# Patient Record
Sex: Male | Born: 1945 | Race: White | Hispanic: No | Marital: Married | Smoking: Former smoker
Health system: Southern US, Community
[De-identification: ages and names within clinical notes are randomized; demographics above are authoritative.]

## PROBLEM LIST (undated history)

## (undated) DIAGNOSIS — J439 Emphysema, unspecified: Secondary | ICD-10-CM

## (undated) DIAGNOSIS — R0600 Dyspnea, unspecified: Secondary | ICD-10-CM

## (undated) DIAGNOSIS — J302 Other seasonal allergic rhinitis: Secondary | ICD-10-CM

## (undated) DIAGNOSIS — C3492 Malignant neoplasm of unspecified part of left bronchus or lung: Secondary | ICD-10-CM

## (undated) DIAGNOSIS — Z9981 Dependence on supplemental oxygen: Secondary | ICD-10-CM

## (undated) DIAGNOSIS — I272 Pulmonary hypertension, unspecified: Secondary | ICD-10-CM

## (undated) DIAGNOSIS — F419 Anxiety disorder, unspecified: Secondary | ICD-10-CM

## (undated) DIAGNOSIS — F32A Depression, unspecified: Secondary | ICD-10-CM

## (undated) DIAGNOSIS — I509 Heart failure, unspecified: Secondary | ICD-10-CM

## (undated) DIAGNOSIS — M5430 Sciatica, unspecified side: Secondary | ICD-10-CM

## (undated) DIAGNOSIS — I10 Essential (primary) hypertension: Secondary | ICD-10-CM

## (undated) DIAGNOSIS — F329 Major depressive disorder, single episode, unspecified: Secondary | ICD-10-CM

## (undated) DIAGNOSIS — J90 Pleural effusion, not elsewhere classified: Secondary | ICD-10-CM

## (undated) HISTORY — DX: Other seasonal allergic rhinitis: J30.2

## (undated) HISTORY — PX: ANGIOPLASTY: SHX39

## (undated) HISTORY — DX: Essential (primary) hypertension: I10

## (undated) HISTORY — DX: Pleural effusion, not elsewhere classified: J90

## (undated) HISTORY — DX: Heart failure, unspecified: I50.9

## (undated) HISTORY — DX: Emphysema, unspecified: J43.9

## (undated) HISTORY — DX: Pulmonary hypertension, unspecified: I27.20

## (undated) HISTORY — DX: Malignant neoplasm of unspecified part of left bronchus or lung: C34.92

## (undated) HISTORY — PX: TONSILLECTOMY: SUR1361

## (undated) HISTORY — PX: CARDIAC CATHETERIZATION: SHX172

## (undated) HISTORY — PX: MASTOIDECTOMY: SHX711

---

## 2014-05-17 DIAGNOSIS — I10 Essential (primary) hypertension: Secondary | ICD-10-CM | POA: Insufficient documentation

## 2014-05-17 DIAGNOSIS — I5021 Acute systolic (congestive) heart failure: Secondary | ICD-10-CM | POA: Insufficient documentation

## 2014-06-10 DIAGNOSIS — J90 Pleural effusion, not elsewhere classified: Secondary | ICD-10-CM | POA: Insufficient documentation

## 2014-11-09 DIAGNOSIS — F17201 Nicotine dependence, unspecified, in remission: Secondary | ICD-10-CM | POA: Insufficient documentation

## 2014-11-09 DIAGNOSIS — J449 Chronic obstructive pulmonary disease, unspecified: Secondary | ICD-10-CM | POA: Insufficient documentation

## 2014-11-09 DIAGNOSIS — I50812 Chronic right heart failure: Secondary | ICD-10-CM | POA: Insufficient documentation

## 2015-02-17 DIAGNOSIS — M545 Low back pain, unspecified: Secondary | ICD-10-CM | POA: Insufficient documentation

## 2015-02-17 DIAGNOSIS — I272 Pulmonary hypertension, unspecified: Secondary | ICD-10-CM | POA: Insufficient documentation

## 2016-01-08 ENCOUNTER — Encounter: Payer: Self-pay | Admitting: Pulmonary Disease

## 2016-01-08 ENCOUNTER — Ambulatory Visit (INDEPENDENT_AMBULATORY_CARE_PROVIDER_SITE_OTHER): Payer: Medicare Other | Admitting: Pulmonary Disease

## 2016-01-08 VITALS — BP 100/62 | HR 109 | Ht 67.75 in | Wt 125.6 lb

## 2016-01-08 DIAGNOSIS — J438 Other emphysema: Secondary | ICD-10-CM

## 2016-01-08 DIAGNOSIS — I272 Other secondary pulmonary hypertension: Secondary | ICD-10-CM

## 2016-01-08 DIAGNOSIS — J432 Centrilobular emphysema: Secondary | ICD-10-CM

## 2016-01-08 DIAGNOSIS — J90 Pleural effusion, not elsewhere classified: Secondary | ICD-10-CM

## 2016-01-08 DIAGNOSIS — J9611 Chronic respiratory failure with hypoxia: Secondary | ICD-10-CM

## 2016-01-08 DIAGNOSIS — R64 Cachexia: Secondary | ICD-10-CM

## 2016-01-08 MED ORDER — ALBUTEROL SULFATE (2.5 MG/3ML) 0.083% IN NEBU: 2.5000 mg | INHALATION_SOLUTION | Freq: Four times a day (QID) | RESPIRATORY_TRACT | Status: AC | PRN

## 2016-01-08 MED ORDER — IPRATROPIUM BROMIDE 0.02 % IN SOLN: 0.5000 mg | Freq: Four times a day (QID) | RESPIRATORY_TRACT | Status: DC

## 2016-01-08 NOTE — Progress Notes (Deleted)
   Subjective:    Patient ID: Jose Mckinney, male    DOB: [birth year], 70 y.o.   MRN: [id number]  HPI    Review of Systems  Constitutional: Positive for appetite change and unexpected weight change. Negative for fever.       Since 2013/2014  HENT: Positive for congestion and sneezing. Negative for dental problem, ear pain, nosebleeds, postnasal drip, rhinorrhea, sinus pressure, sore throat and trouble swallowing.   Eyes: Negative for redness and itching.  Respiratory: Positive for shortness of breath. Negative for cough, chest tightness and wheezing.   Cardiovascular: Negative for palpitations and leg swelling.  Gastrointestinal: Positive for abdominal pain. Negative for nausea and vomiting.  Genitourinary: Negative for dysuria.  Musculoskeletal: Negative for joint swelling.  Skin: Positive for rash ( ithcing).  Neurological: Negative for headaches.  Hematological: Does not bruise/bleed easily.  Psychiatric/Behavioral: Negative for dysphoric mood. The patient is not nervous/anxious.        Objective:   Physical Exam        Assessment & Plan:

## 2016-01-08 NOTE — Patient Instructions (Signed)
Albuterol and Ipratropium nebulized every 4 to 6 hours as needed for cough, wheeze, or chest congestion  Will arrange for referral to pulmonary rehab  Follow up in 3 months

## 2016-01-08 NOTE — Progress Notes (Signed)
Past medical history He  has a past medical history of HTN (hypertension); COPD (chronic obstructive pulmonary disease) ([place]); CHF (congestive heart failure) ([place]); Pulmonary HTN ([place]); and Seasonal allergies.  Past surgical history He  has past surgical history that includes Mastoidectomy and Angioplasty.  Family history His family history is not on file. He was adopted.  Social history He  reports that he quit smoking about 17 months ago. His smoking use included Cigarettes. He has a 125 pack-year smoking history. He does not have any smokeless tobacco history on file. He reports that he drinks alcohol. He reports that he does not use illicit drugs.  No Known Allergies  No current outpatient prescriptions on file prior to visit.   No current facility-administered medications on file prior to visit.   Review of Systems  Constitutional: Positive for appetite change and unexpected weight change. Negative for fever.       Since 2013/2014  HENT: Positive for congestion and sneezing. Negative for dental problem, ear pain, nosebleeds, postnasal drip, rhinorrhea, sinus pressure, sore throat and trouble swallowing.   Eyes: Negative for redness and itching.  Respiratory: Positive for shortness of breath. Negative for cough, chest tightness and wheezing.   Cardiovascular: Negative for palpitations and leg swelling.  Gastrointestinal: Positive for abdominal pain. Negative for nausea and vomiting.  Genitourinary: Negative for dysuria.  Musculoskeletal: Negative for joint swelling.  Skin: Positive for rash ( ithcing).  Neurological: Negative for headaches.  Hematological: Does not bruise/bleed easily.  Psychiatric/Behavioral: Negative for dysphoric mood. The patient is not nervous/anxious.    Chief Complaint  Patient presents with  . Pulmonary Consult    Referred by Dr Tollie Pizza for COPD.     Tests CT chest 05/30/14 >> loculated Lt effusion, free flowing Rt effusion Echo 06/09/14 >> EF 60 to  47%, grade 1 diastolic dysfx, mild MR  Vital signs BP 100/62 mmHg  Pulse 109  Ht 5' 7.75" (1.721 m)  Wt 125 lb 9.6 oz (56.972 kg)  BMI 19.24 kg/m2  SpO2 95%  History of present illness Jose Mckinney is a 70 y.o. 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  He recently moved from [place], [place] to [place].  He needs to establish with pulmonary medicine here.  He has long history of smoking, but has quit.  He is no longer working.  He denies occupational exposures.  He has hx of loculated Lt pleural effusion.  No diagnosis was established for this.  He gets winded with activity, but tries to stay active.  He is interest in enrolling in pulmonary rehab.  He has occasional cough and wheeze.  His inhalers are much more expensive in [place] compared to [place].  As a result he has been out of spiriva and symbicort.  He has noticed the difference w/o these inhalers.  He has been on revatio for pulmonary hypertension.  He denies chest pain, fever, leg swelling, or hemoptysis.  He has lost about 40 lbs over the past 2 to 3 yrs, but his weight has been steady recently.  He denies hx of pneumonia or exposure to tuberculosis.   Physical exam  General - No distress, thin ENT - No sinus tenderness, no oral exudate, no LAN, no thyromegaly, TM clear, pupils equal/reactive, wears dentures Cardiac - s1s2 regular, no murmur, pulses symmetric Chest - decreased BS at bases, poor air movement, no wheezing Back - No focal tenderness Abd - Soft, non-tender, no organomegaly, + bowel sounds Ext - No edema, decreased muscle  mass Neuro - Normal strength, cranial nerves intact Skin - No rashes Psych - Normal mood, and behavior   Assessment/plan  COPD/emphysema. Plan: - will have him try prn albuterol/ipratopium via nebulizer >> he is not able to afford other inhalers - will arrange for pulmonary rehab referral  Chronic loculated left pleural effusion. Plan: - monitor clinically and f/u  CXR if symptoms progress  Pulmonary hypertension. Plan: - will continue revatio since he has been on this for a while, and seems to tolerate therapy with some improvement  Protein calorie malnutrition. Plan: - f/u with PCP    Patient Instructions  Albuterol and Ipratropium nebulized every 4 to 6 hours as needed for cough, wheeze, or chest congestion  Will arrange for referral to pulmonary rehab  Follow up in 3 months     Chesley Mires, MD [facility] Pager:  [phone]

## 2016-01-09 ENCOUNTER — Encounter: Payer: Self-pay | Admitting: Pulmonary Disease

## 2016-01-22 ENCOUNTER — Other Ambulatory Visit: Payer: Self-pay | Admitting: Gastroenterology

## 2016-02-05 ENCOUNTER — Encounter (HOSPITAL_COMMUNITY)
Admission: RE | Admit: 2016-02-05 | Discharge: 2016-02-05 | Disposition: A | Discharge status: Home / Self Care | Payer: Medicare Other | Source: Ambulatory Visit | Attending: Pulmonary Disease | Admitting: Pulmonary Disease

## 2016-02-05 ENCOUNTER — Encounter (HOSPITAL_COMMUNITY): Payer: Self-pay

## 2016-02-05 VITALS — BP 125/97 | HR 85 | Resp 18 | Ht 67.25 in | Wt 127.2 lb

## 2016-02-05 DIAGNOSIS — J439 Emphysema, unspecified: Secondary | ICD-10-CM | POA: Diagnosis present

## 2016-02-05 DIAGNOSIS — J438 Other emphysema: Secondary | ICD-10-CM

## 2016-02-05 NOTE — Progress Notes (Signed)
Jose Mckinney 70 y.o. male Pulmonary Rehab Orientation Note Patient arrived today in Cardiac and Pulmonary Rehab for orientation to Pulmonary Rehab. He ambulated from [place] parking, accompanied by his wife. He does carry portable oxygen, but uses it randomly, when he feels short of breath. He also has a home concentrator, but again uses it randomly and not consistently at night. Color good, skin warm and dry. Patient is oriented to time and place. Patient's medical history, psychosocial health, and medications reviewed. Psychosocial assessment reveals pt lives with their spouse. They moved here from [place] the end of 2016 to be closer to their son and grandchildren. They are now living in [place], [place]. Pt is currently retired. He was in the grocery business prior to moving. Pt hobbies include reading, racing 6 wheeled ATVs, and working on them. He no longer races them, although he would love to start again. He continues to work on them, but has trouble with shortness of breath when he bends over. He also loves spending time with his grandchildren. Pt reports his stress level is moderate. Areas of stress/anxiety include Health.  Pt exhibits signs of depression. Signs of depression include anxiety and difficulty falling asleep. PHQ2/9 score 1/NA. Pt shows good  coping skills with a questionable outlook on live. He is extremely excited to begin pulmonary rehab and feels this is a new beginning to managing his disease. He and his wife are also looking forward to the education portion of the program as the do not fully understand emphysema and pulmonary hypertension. He was offered emotional support and reassurance. Will continue to monitor and evaluate progress toward psychosocial goal(s) of becoming more positive about his future and his ability to maintain/improve his health. Physical assessment reveals heart rate is normal, breath sounds are diminished throughout. Grip strength equal, strong. Distal pulses  palpable. No edema noted. Patient reports he does take medications as prescribed. Patient states he follows a Regular diet, but does attempt to watch his salt. He has lost a significant amount of weight over the last year. He complains of almost complete loss of taste. The patient reports no specific efforts to gain or lose weight. He does weight himself daily and monitors his weight loss. Patient's weight will be monitored closely. Demonstration and practice of PLB using pulse oximeter. Patient able to return demonstration satisfactorily. Safety and hand hygiene in the exercise area reviewed with patient. Patient voices understanding of the information reviewed. Department expectations discussed with patient and achievable goals were set. The patient shows enthusiasm about attending the program and we look forward to working with this nice gentleman. The patient is scheduled for a 6 min walk test on Tuesday 2/14 at 3:30 and to begin exercise on Tuesday 2/21 in the 10:30 class.   45 minutes was spent on a variety of activities such as assessment of the patient, obtaining baseline data including height, weight, BMI, and grip strength, verifying medical history, allergies, and current medications, and teaching patient strategies for performing tasks with less respiratory effort with emphasis on pursed lip breathing.

## 2016-02-06 ENCOUNTER — Encounter (HOSPITAL_COMMUNITY): Payer: Self-pay | Admitting: *Deleted

## 2016-02-06 ENCOUNTER — Encounter (HOSPITAL_COMMUNITY)
Admission: RE | Admit: 2016-02-06 | Discharge: 2016-02-06 | Disposition: A | Discharge status: Home / Self Care | Payer: Medicare Other | Source: Ambulatory Visit | Attending: Pulmonary Disease | Admitting: Pulmonary Disease

## 2016-02-06 DIAGNOSIS — J439 Emphysema, unspecified: Secondary | ICD-10-CM | POA: Diagnosis not present

## 2016-02-06 NOTE — Progress Notes (Signed)
Alarik completed a Six-Minute Walk Test on 02/06/16 . Ananias walked 670 feet with 1 break of 1 minute 8 seconds.  The patient's lowest oxygen saturation was 82 %, highest heart rate was 112 bpm , and highest blood pressure was 108/70. The patient was on 2 liters of oxygen with a nasal cannula. Patient stated that nothing hindered their walk test.   Sol Passer, MS, ACSM CCEP

## 2016-02-09 ENCOUNTER — Encounter (HOSPITAL_COMMUNITY)
Admission: RE | Disposition: A | Discharge status: Home / Self Care | Payer: Self-pay | Source: Ambulatory Visit | Attending: Gastroenterology

## 2016-02-09 ENCOUNTER — Ambulatory Visit (HOSPITAL_COMMUNITY): Payer: Medicare Other | Admitting: Anesthesiology

## 2016-02-09 ENCOUNTER — Encounter (HOSPITAL_COMMUNITY): Payer: Self-pay | Admitting: *Deleted

## 2016-02-09 ENCOUNTER — Ambulatory Visit (HOSPITAL_COMMUNITY)
Admission: RE | Admit: 2016-02-09 | Discharge: 2016-02-09 | Disposition: A | Discharge status: Home / Self Care | Payer: Medicare Other | Source: Ambulatory Visit | Attending: Gastroenterology | Admitting: Gastroenterology

## 2016-02-09 DIAGNOSIS — I272 Other secondary pulmonary hypertension: Secondary | ICD-10-CM | POA: Diagnosis not present

## 2016-02-09 DIAGNOSIS — Z9981 Dependence on supplemental oxygen: Secondary | ICD-10-CM | POA: Diagnosis not present

## 2016-02-09 DIAGNOSIS — Z79899 Other long term (current) drug therapy: Secondary | ICD-10-CM | POA: Diagnosis not present

## 2016-02-09 DIAGNOSIS — K649 Unspecified hemorrhoids: Secondary | ICD-10-CM | POA: Diagnosis not present

## 2016-02-09 DIAGNOSIS — I509 Heart failure, unspecified: Secondary | ICD-10-CM | POA: Insufficient documentation

## 2016-02-09 DIAGNOSIS — K5730 Diverticulosis of large intestine without perforation or abscess without bleeding: Secondary | ICD-10-CM | POA: Diagnosis not present

## 2016-02-09 DIAGNOSIS — I110 Hypertensive heart disease with heart failure: Secondary | ICD-10-CM | POA: Insufficient documentation

## 2016-02-09 DIAGNOSIS — I10 Essential (primary) hypertension: Secondary | ICD-10-CM | POA: Insufficient documentation

## 2016-02-09 DIAGNOSIS — R001 Bradycardia, unspecified: Secondary | ICD-10-CM | POA: Diagnosis not present

## 2016-02-09 DIAGNOSIS — Z1211 Encounter for screening for malignant neoplasm of colon: Secondary | ICD-10-CM | POA: Diagnosis present

## 2016-02-09 DIAGNOSIS — Z87891 Personal history of nicotine dependence: Secondary | ICD-10-CM | POA: Diagnosis not present

## 2016-02-09 DIAGNOSIS — J439 Emphysema, unspecified: Secondary | ICD-10-CM | POA: Diagnosis not present

## 2016-02-09 DIAGNOSIS — D128 Benign neoplasm of rectum: Secondary | ICD-10-CM | POA: Diagnosis not present

## 2016-02-09 DIAGNOSIS — D122 Benign neoplasm of ascending colon: Secondary | ICD-10-CM | POA: Diagnosis not present

## 2016-02-09 HISTORY — DX: Depression, unspecified: F32.A

## 2016-02-09 HISTORY — PX: COLONOSCOPY WITH PROPOFOL: SHX5780

## 2016-02-09 HISTORY — DX: Major depressive disorder, single episode, unspecified: F32.9

## 2016-02-09 HISTORY — DX: Anxiety disorder, unspecified: F41.9

## 2016-02-09 HISTORY — DX: Sciatica, unspecified side: M54.30

## 2016-02-09 LAB — BASIC METABOLIC PANEL
ANION GAP: 13 (ref 5–15)
BUN: 28 mg/dL — ABNORMAL HIGH (ref 6–20)
CO2: 28 mmol/L (ref 22–32)
Calcium: 10.2 mg/dL (ref 8.9–10.3)
Chloride: 97 mmol/L — ABNORMAL LOW (ref 101–111)
Creatinine, Ser: 1.13 mg/dL (ref 0.61–1.24)
Glucose, Bld: 80 mg/dL (ref 65–99)
POTASSIUM: 4.3 mmol/L (ref 3.5–5.1)
SODIUM: 138 mmol/L (ref 135–145)

## 2016-02-09 LAB — CBC
HCT: 35.7 % — ABNORMAL LOW (ref 39.0–52.0)
Hemoglobin: 11.4 g/dL — ABNORMAL LOW (ref 13.0–17.0)
MCH: 29.9 pg (ref 26.0–34.0)
MCHC: 31.9 g/dL (ref 30.0–36.0)
MCV: 93.7 fL (ref 78.0–100.0)
PLATELETS: 277 10*3/uL (ref 150–400)
RBC: 3.81 MIL/uL — AB (ref 4.22–5.81)
RDW: 13.8 % (ref 11.5–15.5)
WBC: 9 10*3/uL (ref 4.0–10.5)

## 2016-02-09 LAB — POCT I-STAT 4, (NA,K, GLUC, HGB,HCT)
GLUCOSE: 78 mg/dL (ref 65–99)
HEMATOCRIT: 37 % — AB (ref 39.0–52.0)
Hemoglobin: 12.6 g/dL — ABNORMAL LOW (ref 13.0–17.0)
POTASSIUM: 4.1 mmol/L (ref 3.5–5.1)
Sodium: 135 mmol/L (ref 135–145)

## 2016-02-09 SURGERY — COLONOSCOPY WITH PROPOFOL
Anesthesia: Monitor Anesthesia Care

## 2016-02-09 MED ORDER — PROPOFOL 500 MG/50ML IV EMUL
INTRAVENOUS | Status: DC | PRN
  Administered 2016-02-09 (×2): 30 mg via INTRAVENOUS
  Administered 2016-02-09 (×2): 20 mg via INTRAVENOUS

## 2016-02-09 MED ORDER — PROPOFOL 10 MG/ML IV BOLUS
INTRAVENOUS | Status: AC
  Filled 2016-02-09: qty 40

## 2016-02-09 MED ORDER — GLYCOPYRROLATE 0.2 MG/ML IJ SOLN
INTRAMUSCULAR | Status: DC | PRN
  Administered 2016-02-09: 0.2 mg via INTRAVENOUS

## 2016-02-09 MED ORDER — SODIUM CHLORIDE 0.9 % IV SOLN: INTRAVENOUS | Status: DC

## 2016-02-09 MED ORDER — LACTATED RINGERS IV SOLN
INTRAVENOUS | Status: DC
  Administered 2016-02-09: 10:00:00 via INTRAVENOUS

## 2016-02-09 MED ORDER — GLYCOPYRROLATE 0.2 MG/ML IJ SOLN
INTRAMUSCULAR | Status: AC
  Filled 2016-02-09: qty 1

## 2016-02-09 MED ORDER — PROPOFOL 500 MG/50ML IV EMUL
INTRAVENOUS | Status: DC | PRN
  Administered 2016-02-09: 75 ug/kg/min via INTRAVENOUS

## 2016-02-09 SURGICAL SUPPLY — 21 items

## 2016-02-09 NOTE — Anesthesia Preprocedure Evaluation (Addendum)
Anesthesia Evaluation  Patient identified by MRN, date of birth, ID band Patient awake    Reviewed: Allergy & Precautions, NPO status , Patient's Chart, lab work & pertinent test results, reviewed documented beta blocker date and time   Airway Mallampati: I  TM Distance: >3 FB Neck ROM: Full    Dental  (+) Edentulous Upper, Edentulous Lower   Pulmonary COPD,  oxygen dependent, former smoker,     + decreased breath sounds      Cardiovascular hypertension, Pt. on medications and Pt. on home beta blockers +CHF   Rhythm:Regular Rate:Normal     Neuro/Psych PSYCHIATRIC DISORDERS Anxiety Depression  Neuromuscular disease    GI/Hepatic negative GI ROS, Neg liver ROS,   Endo/Other  negative endocrine ROS  Renal/GU negative Renal ROS  negative genitourinary   Musculoskeletal negative musculoskeletal ROS (+)   Abdominal   Peds negative pediatric ROS (+)  Hematology negative hematology ROS (+)   Anesthesia Other Findings - Pulm HTN   Reproductive/Obstetrics negative OB ROS                           Anesthesia Physical Anesthesia Plan  ASA: III  Anesthesia Plan: MAC   Post-op Pain Management:    Induction: Intravenous  Airway Management Planned: Natural Airway  Additional Equipment:   Intra-op Plan:   Post-operative Plan:   Informed Consent: I have reviewed the patients History and Physical, chart, labs and discussed the procedure including the risks, benefits and alternatives for the proposed anesthesia with the patient or authorized representative who has indicated his/her understanding and acceptance.     Plan Discussed with: CRNA  Anesthesia Plan Comments:         Anesthesia Quick Evaluation

## 2016-02-09 NOTE — H&P (Signed)
  Jose Mckinney HPI: At this time the patient denies any problems with nausea, vomiting, fevers, chills, abdominal pain, diarrhea, constipation, hematochezia, melena, GERD, or dysphagia. The patient was adopted. He does not know his family history. No complaints of chest pain, MI, or sleep apnea. The patient quit smoking last Summer and he has lost 40 lbs. The patient has severe emphysema and he is on home oxygen.  Past Medical History  Diagnosis Date  . HTN (hypertension)   . COPD with emphysema ([place] [facility])   . Pulmonary HTN ([facility])     Dr. Marlyn Corporal is following.  . Seasonal allergies   . Recurrent left pleural effusion   . Depression   . Anxiety   . Sciatic leg pain     INTERMITTENT- occ. uses Hydrocodone as needed  . CHF (congestive heart failure) ([facility])     x 3 episodes- [place]. pt. living here since 11-'1-16    Past Surgical History  Procedure Laterality Date  . Mastoidectomy Left   . Angioplasty    . Cardiac catheterization      '15- [place]- no issues now.  . Tonsillectomy      Family History  Problem Relation Age of Onset  . Adopted: Yes    Social History:  reports that he quit smoking about 19 months ago. His smoking use included Cigarettes. He has a 125 pack-year smoking history. He does not have any smokeless tobacco history on file. He reports that he drinks alcohol. He reports that he does not use illicit drugs.  Allergies: No Known Allergies  Medications: Scheduled: Continuous:  No results found for this or any previous visit (from the past 24 hour(s)).   No results found.  ROS:  As stated above in the HPI otherwise negative.  There were no vitals taken for this visit.    PE: Gen: NAD, Alert and Oriented HEENT:  [place]/AT, EOMI Neck: Supple, no LAD Lungs: CTA Bilaterally CV: RRR without M/G/R ABM: Soft, NTND, +BS Ext: No C/C/E  Assessment/Plan: 1) Screening colonoscopy. 2) Emphysema - severe.  Regla Fitzgibbon D 02/09/2016, 7:08 AM

## 2016-02-09 NOTE — Op Note (Signed)
[facility] [place], [address]   COLONOSCOPY PROCEDURE REPORT  PATIENT: Jose, Mckinney  MR#: [id number] BIRTHDATE: November 24, 1946 , 28  yrs. old GENDER: male ENDOSCOPIST: Carol Ada, MD REFERRED BY: PROCEDURE DATE:  Mar 01, 2016 PROCEDURE:   Colonoscopy with snare polypectomy ASA CLASS:   Class III INDICATIONS: Screening MEDICATIONS: Monitored anesthesia care  DESCRIPTION OF PROCEDURE:   After the risks and benefits and of the procedure were explained, informed consent was obtained.  revealed no abnormalities of the rectum.    The Pentax Ped Colon [id number] endoscope was introduced through the anus and advanced to the cecum, which was identified by both the appendix and ileocecal valve .  The quality of the prep was good. .  The instrument was then slowly withdrawn as the colon was fully examined. Estimated blood loss is zero unless otherwise noted in this procedure report.   FINDINGS:  The colonoscopy was extremely difficult to perform as the sigmoid colon was extremely torturous.  Intubation time was approximately 30 minutes.  Even with minimal insufflation and the use of water infusion, it was still very difficult to traverse. Abdominal pressure was not helpful.  With slow maneuvering and angulation the colonoscope was able to pass through the ares.  A 4 mm sessile ascending colon polyp was removed with a cold snare.  A 4 mm sessile rectal polyp was removed with a cold snare, but there was residual tissue that was not amenable to further cold resection.  The area was abalted with the tip of the snare with cautery.  Sigmoid diverticula were found.  Some hematochezia was found during the procedure and this was secondary to his hemorrhoids.     Retroflexed views revealed no abnormalities. The scope was then withdrawn from the patient and the procedure completed.  WITHDRAWAL TIME: 11 minutes.  COMPLICATIONS: There were no immediate  complications. ENDOSCOPIC IMPRESSION: 1) Polyps. 2) Diverticula.  RECOMMENDATIONS: 1) Follow up biopsies. 2) Repeat the colonoscopy in 5 years.  REPEAT EXAM:  cc:  _______________________________ eSignedCarol Ada, MD 03/01/16 11:23 AM   CPT CODES: ICD CODES:  The ICD and CPT codes recommended by this software are interpretations from the data that the clinical staff has captured with the software.  The verification of the translation of this report to the ICD and CPT codes and modifiers is the sole responsibility of the health care institution and practicing physician where this report was generated.  [facility]. will not be held responsible for the validity of the ICD and CPT codes included on this report.  AMA assumes no liability for data contained or not contained herein. CPT is a Designer, television/film set of the [employer].   PATIENT NAME:  Jose, Mckinney MR#: [id number]

## 2016-02-09 NOTE — Anesthesia Postprocedure Evaluation (Signed)
Anesthesia Post Note  Patient: Jose Mckinney  Procedure(s) Performed: Procedure(s) (LRB): COLONOSCOPY WITH PROPOFOL (N/A)  Patient location during evaluation: PACU Anesthesia Type: MAC Level of consciousness: awake and alert Pain management: pain level controlled Vital Signs Assessment: post-procedure vital signs reviewed and stable Respiratory status: spontaneous breathing, nonlabored ventilation, respiratory function stable and patient connected to nasal cannula oxygen Cardiovascular status: stable and blood pressure returned to baseline Anesthetic complications: no    Last Vitals:  Filed Vitals:   02/09/16 1145 02/09/16 1150  BP:    Pulse: 65 68  Temp:    Resp: 23 21    Last Pain: There were no vitals filed for this visit.               Effie Berkshire

## 2016-02-09 NOTE — Transfer of Care (Signed)
Immediate Anesthesia Transfer of Care Note  Patient: Jose Mckinney  Procedure(s) Performed: Procedure(s): COLONOSCOPY WITH PROPOFOL (N/A)  Patient Location: PACU  Anesthesia Type:MAC  Level of Consciousness:  sedated, patient cooperative and responds to stimulation  Airway & Oxygen Therapy:Patient Spontanous Breathing and Patient connected to face mask oxgen  Post-op Assessment:  Report given to PACU RN and Post -op Vital signs reviewed and stable  Post vital signs:  Reviewed and stable  Last Vitals:  Filed Vitals:   02/09/16 0946  BP: 156/79  Pulse: 54  Temp: 74.9 C    Complications: No apparent anesthesia complications

## 2016-02-09 NOTE — Discharge Instructions (Signed)
Colonoscopy, Care After °Refer to this sheet in the next few weeks. These instructions provide you with information on caring for yourself after your procedure. Your health care provider may also give you more specific instructions. Your treatment has been planned according to current medical practices, but problems sometimes occur. Call your health care provider if you have any problems or questions after your procedure. °WHAT TO EXPECT AFTER THE PROCEDURE  °After your procedure, it is typical to have the following: °· A small amount of blood in your stool. °· Moderate amounts of gas and mild abdominal cramping or bloating. °HOME CARE INSTRUCTIONS °· Do not drive, operate machinery, or sign important documents for 24 hours. °· You may shower and resume your regular physical activities, but move at a slower pace for the first 24 hours. °· Take frequent rest periods for the first 24 hours. °· Walk around or put a warm pack on your abdomen to help reduce abdominal cramping and bloating. °· Drink enough fluids to keep your urine clear or pale yellow. °· You may resume your normal diet as instructed by your health care provider. Avoid heavy or fried foods that are hard to digest. °· Avoid drinking alcohol for 24 hours or as instructed by your health care provider. °· Only take over-the-counter or prescription medicines as directed by your health care provider. °· If a tissue sample (biopsy) was taken during your procedure: °¨ Do not take aspirin or blood thinners for 7 days, or as instructed by your health care provider. °¨ Do not drink alcohol for 7 days, or as instructed by your health care provider. °¨ Eat soft foods for the first 24 hours. °SEEK MEDICAL CARE IF: °You have persistent spotting of blood in your stool 2-3 days after the procedure. °SEEK IMMEDIATE MEDICAL CARE IF: °· You have more than a small spotting of blood in your stool. °· You pass large blood clots in your stool. °· Your abdomen is swollen  (distended). °· You have nausea or vomiting. °· You have a fever. °· You have increasing abdominal pain that is not relieved with medicine. °  °This information is not intended to replace advice given to you by your health care provider. Make sure you discuss any questions you have with your health care provider. °  °Document Released: 07/23/2004 Document Revised: 09/29/2013 Document Reviewed: 08/16/2013 °Elsevier Interactive Patient Education ©2016 Elsevier Inc. ° °

## 2016-02-12 ENCOUNTER — Encounter (HOSPITAL_COMMUNITY): Payer: Self-pay | Admitting: Gastroenterology

## 2016-02-13 ENCOUNTER — Encounter (HOSPITAL_COMMUNITY)
Admission: RE | Admit: 2016-02-13 | Discharge: 2016-02-13 | Disposition: A | Discharge status: Home / Self Care | Payer: Medicare Other | Source: Ambulatory Visit | Attending: Pulmonary Disease | Admitting: Pulmonary Disease

## 2016-02-13 DIAGNOSIS — J439 Emphysema, unspecified: Secondary | ICD-10-CM | POA: Diagnosis not present

## 2016-02-13 NOTE — Progress Notes (Signed)
Today, Chance exercised at [employer]. [facility]. Service time was from 1030 to 1200.  The patient exercised by performing aerobic, strengthening, and stretching exercises. Oxygen saturation, heart rate, blood pressure, rate of perceived exertion, and shortness of breath were all monitored before, during, and after exercise. [name] presented with no problems at today's exercise session.  The patient did not have an increase in workload intensity during today's exercise session.  Pre-exercise vitals: . Weight kg: 56.3 . Liters of O2: 2 . SpO2: 93 . HR: 51 . BP: 102/64 . CBG: NA  Exercise vitals: . Highest heartrate:  76 . Lowest oxygen saturation: 91 . Highest blood pressure: 114/70 . Liters of 02: 2  Post-exercise vitals: . SpO2: 95 . HR: 55 . BP: 112/70 . Liters of O2: 2 . CBG: NA Dr. Rush Farmer, Medical Director Dr. Marily Memos is immediately available during today's Pulmonary Rehab session for Jose Mckinney on 02/13/2016  at 1030 class time  .

## 2016-02-15 ENCOUNTER — Encounter (HOSPITAL_COMMUNITY)
Admission: RE | Admit: 2016-02-15 | Discharge: 2016-02-15 | Disposition: A | Discharge status: Home / Self Care | Payer: Medicare Other | Source: Ambulatory Visit | Attending: Pulmonary Disease | Admitting: Pulmonary Disease

## 2016-02-15 DIAGNOSIS — J439 Emphysema, unspecified: Secondary | ICD-10-CM | POA: Diagnosis not present

## 2016-02-15 NOTE — Progress Notes (Signed)
Jose Mckinney 70 y.o. male Nutrition Note Spoke with pt. Pt is underweight for a pulmonary pt and has a h/o 40 lb wt loss over the past 2-3 years.  Pt eats 3-5 meals a day. Pt is trying to increase kcal and protein in his diet by eating more frequently and drinking a Boost HP daily, but pt has maintained his wt around 124-127 lb. Pt's Rate Your Plate results not reviewed with pt due to high calorie, high protein diet indicated. Pt educated re: high calorie, high protein diet. Pt with dx of CHF. Pt states he has been watching his sodium intake. Pt has been avoiding foods with very low sodium content. Low sodium diet and reading food labels for low sodium foods discussed. The role of sodium in heart and lung disease reviewed with pt. Pt expressed understanding of the information reviewed.  No results found for: HGBA1C  Nutrition Diagnosis ? Food-and nutrition-related knowledge deficit related to lack of exposure to information as related to diagnosis of pulmonary disease ? Increased energy expenditure related to increased energy requirements during COPD exacerbation as evidenced by BMI <20 and recent h/o wt loss. ?  Nutrition Rx/Est. Daily Nutrition Needs for: ? wt gain 2100-2500 Kcal  90-115 gm protein   1500 mg or less sodium Nutrition Intervention ? Pt's individual nutrition plan and goals reviewed with pt. ? Benefits of adopting healthy eating habits discussed when pt's Rate Your Plate reviewed. ? Pt to attend the Nutrition and Lung Disease class ? Handouts given for: Low-Sodium Nutrition Therapy, High Calorie, High Protein diet; Suggestions for Increasing Calories and Protein; and High Calorie, High Protein recipes ? Continual client-centered nutrition education by RD, as part of interdisciplinary care. Goal(s) 1. The pt will recognize symptoms that can interfere with adequate oral intake, such as shortness of breath, N/V, early satiety, fatigue, ability to secure and prepare food, taste and  smell changes, chewing/swallowing difficulties, and/ or pain when eating. 2. The pt will consume high-energy, high-nutrient dense beverages when necessary to compensate for decreased oral intake of solid foods. 3. Identify food quantities necessary to achieve wt gain of  -2# per week to a goal wt gain of 2.7-10.9 kg (6-24 lb) at graduation from pulmonary rehab. Monitor and Evaluate progress toward nutrition goal with team.   Derek Mound, M.Ed, RD, LDN, CDE 02/15/2016 11:56 AM

## 2016-02-15 NOTE — Progress Notes (Addendum)
Today, [name] exercised at [employer]. [facility]. Service time was from 1030 to 1215.  The patient exercised by performing aerobic, strengthening, and stretching exercises. Oxygen saturation, heart rate, blood pressure, rate of perceived exertion, and shortness of breath were all monitored before, during, and after exercise. Jose Mckinney presented with no problems at today's exercise session. He attended exercise for the pulmonary patient today  The patient did not have an increase in workload intensity during today's exercise session.  Pre-exercise vitals: . Weight kg: 56.5 . Liters of O2: 2 . SpO2: 98 . HR: 81 . BP: 104/60 . CBG: NA  Exercise vitals: . Highest heartrate:  77 . Lowest oxygen saturation: 93 . Highest blood pressure: 120/60 . Liters of 02: 2  Post-exercise vitals: . SpO2: 99 . HR: 86 . BP: 122/60 . Liters of O2: 2 . CBG: NA Dr. Rush Farmer, Medical Director Dr. Waldron Labs is immediately available during today's Pulmonary Rehab session for Malena Peer on 02/15/2016  at 1030 class time.  Marland Kitchen

## 2016-02-20 ENCOUNTER — Encounter (HOSPITAL_COMMUNITY)
Admission: RE | Admit: 2016-02-20 | Discharge: 2016-02-20 | Disposition: A | Discharge status: Home / Self Care | Payer: Medicare Other | Source: Ambulatory Visit | Attending: Pulmonary Disease | Admitting: Pulmonary Disease

## 2016-02-20 DIAGNOSIS — J439 Emphysema, unspecified: Secondary | ICD-10-CM | POA: Diagnosis not present

## 2016-02-20 NOTE — Progress Notes (Addendum)
I have reviewed a Home Exercise Prescription with Jose Mckinney . [name] is walking  currently at home.  The patient was advised to walk 2-3 days a week for 30-45 minutes.  [place] and I discussed how to progress their exercise prescription.  The patient stated that their goals were increase upper body strength and gain weight.  The patient stated that they understand the exercise prescription.  We reviewed exercise guidelines, target heart rate during exercise, oxygen use, weather, home pulse oximeter, endpoints for exercise, and goals.  Patient is encouraged to come to me with any questions. I will continue to follow up with the patient to assist them with progression and safety.   Alberteen Sam, [place], ACSM RCEP [phone]

## 2016-02-20 NOTE — Progress Notes (Signed)
Daily Session Note  Patient Details  Name: Jose Mckinney MRN: [id number] Date of Birth: [birth year] Referring Provider:  Stephens Shire, MD  Encounter Date: 02/20/2016  Check In:     Session Check In - 02/20/16 1021    Check-In   Location [facility]   Staff Present Rosebud Poles, RN, Luisa Hart, RN, BSN;Ramon Dredge, RN, MHA;Jenea Dake Luan Pulling, MA, ACSM RCEP, Exercise Physiologist   Supervising physician immediately available to respond to emergencies [facility] immediately available   Physician(s) Dr. Marily Memos   Medication changes reported     No   Fall or balance concerns reported    No   Warm-up and Cool-down Performed as group-led instruction   Resistance Training Performed Yes   Pain Assessment   Currently in Pain? No/denies      Capillary Blood Glucose: No results found for this or any previous visit (from the past 24 hour(s)).      Exercise Prescription Changes - 02/20/16 1200    Exercise Review   Progression Yes   Response to Exercise   Blood Pressure (Admit) 91/53 mmHg  gatorade   Blood Pressure (Exercise) 100/52 mmHg   Blood Pressure (Exit) 98/52 mmHg   Heart Rate (Admit) 64 bpm   Heart Rate (Exercise) 89 bpm   Heart Rate (Exit) 62 bpm   Oxygen Saturation (Admit) 97 %   Oxygen Saturation (Exercise) 88 %   Oxygen Saturation (Exit) 98 %   Rating of Perceived Exertion (Exercise) 11   Perceived Dyspnea (Exercise) 2   Duration Progress to 45 minutes of aerobic exercise without signs/symptoms of physical distress   Intensity THRR unchanged  40-80% THRR   Progression   Progression Continue to progress workloads to maintain intensity without signs/symptoms of physical distress.   Resistance Training   Training Prescription Yes   Weight orange bands   Reps 10-12   Interval Training   Interval Training No   Oxygen   Oxygen Continuous   Liters 2   Bike   Level 0.5   Minutes 15   NuStep   Level 2   Minutes 15   METs 2.6    Track   Laps 7   Minutes 15     Goals Met:  Proper associated with RPD/PD & O2 Sat Using PLB without cueing & demonstrates good technique Exercise tolerated well Strength training completed today  Goals Unmet:  BP Pt given gatorade at check in due to low BP  Comments: Home exericse reviewed today, please see progress note.   Dr. Rush Farmer is Medical Director for Pulmonary Rehab at [facility].

## 2016-02-22 ENCOUNTER — Encounter (HOSPITAL_COMMUNITY)
Admission: RE | Admit: 2016-02-22 | Discharge: 2016-02-22 | Disposition: A | Discharge status: Home / Self Care | Payer: Medicare Other | Source: Ambulatory Visit | Attending: Pulmonary Disease | Admitting: Pulmonary Disease

## 2016-02-22 DIAGNOSIS — J439 Emphysema, unspecified: Secondary | ICD-10-CM | POA: Insufficient documentation

## 2016-02-22 NOTE — Progress Notes (Signed)
Daily Session Note  Patient Details  Name: Jose Mckinney MRN: [id number] Date of Birth: [birth year] Referring Provider:  Stephens Shire, MD  Encounter Date: 02/22/2016  Check In:     Session Check In - 02/22/16 1140    Check-In   [facility]   Staff Present Rosebud Poles, RN, Luisa Hart, RN, BSN;Ramon Dredge, RN, MHA;Jessica Luan Pulling, MA, ACSM RCEP, Exercise Physiologist   Supervising physician immediately available to respond to emergencies [facility] immediately available   Physician(s) Dr. Marily Memos   Medication changes reported     No   Fall or balance concerns reported    No   Warm-up and Cool-down Performed as group-led instruction   Resistance Training Performed Yes   VAD Patient? No   Pain Assessment   Currently in Pain? No/denies   Multiple Pain Sites No      Capillary Blood Glucose: No results found for this or any previous visit (from the past 24 hour(s)).      Exercise Prescription Changes - 02/22/16 1200    Exercise Review   Progression Yes   Response to Exercise   Blood Pressure (Admit) 102/40 mmHg   Blood Pressure (Exercise) 100/60 mmHg   Blood Pressure (Exit) 94/50 mmHg   Heart Rate (Admit) 55 bpm   Heart Rate (Exercise) 78 bpm   Heart Rate (Exit) 54 bpm   Oxygen Saturation (Admit) 98 %   Oxygen Saturation (Exercise) 94 %   Oxygen Saturation (Exit) 94 %   Rating of Perceived Exertion (Exercise) 9   Perceived Dyspnea (Exercise) 1   Duration Progress to 45 minutes of aerobic exercise without signs/symptoms of physical distress   Intensity THRR unchanged   Progression   Progression Continue to progress workloads to maintain intensity without signs/symptoms of physical distress.   Resistance Training   Training Prescription Yes   Weight orange bands   Reps 10-12   Interval Training   Interval Training No   Oxygen   Oxygen Continuous   Liters 2   Bike   Level 0.5   Minutes 15   Track   Laps 7   Minutes 15     Goals Met:  Improved SOB with ADL's Exercise tolerated well Queuing for purse lip breathing No report of cardiac concerns or symptoms Strength training completed today  Goals Unmet:  Not Applicable  Comments: Service time is from 1030 to 1230, see paper ITP for education   Dr. Rush Farmer is Medical Director for Pulmonary Rehab at [facility].

## 2016-02-27 ENCOUNTER — Encounter (HOSPITAL_COMMUNITY): Admission: RE | Admit: 2016-02-27 | Payer: Medicare Other | Source: Ambulatory Visit

## 2016-02-29 ENCOUNTER — Encounter (HOSPITAL_COMMUNITY)
Admission: RE | Admit: 2016-02-29 | Discharge: 2016-02-29 | Disposition: A | Discharge status: Home / Self Care | Payer: Medicare Other | Source: Ambulatory Visit | Attending: Pulmonary Disease | Admitting: Pulmonary Disease

## 2016-02-29 DIAGNOSIS — J439 Emphysema, unspecified: Secondary | ICD-10-CM | POA: Diagnosis not present

## 2016-02-29 NOTE — Progress Notes (Signed)
Pt completed Quality of Life survey as a participant in Pulmonary Rehab. Scores 21.0 or below are considered low. Pt score very low in several areas Overall 19.12, Health and Function 16.6, physiological and spiritual 17.79, family 22.25.  He feels that the improvements he will make in pulmonary rehab will significantly improve his scores on his post Quality of Life. Will continue to encourage patient to discuss quality of life scores and review as necessary.

## 2016-02-29 NOTE — Progress Notes (Signed)
Daily Session Note  Patient Details  Name: Jose Mckinney MRN: [id number] Date of Birth: 1946/09/14 Referring Provider:  Stephens Shire, MD  Encounter Date: 02/29/2016  Check In:     Session Check In - 02/29/16 1015    Check-In   [facility]   Staff Present Rosebud Poles, RN, Luisa Hart, RN, Levie Heritage, MA, ACSM RCEP, Exercise Physiologist   Supervising physician immediately available to respond to emergencies [facility] immediately available   Physician(s)  Dr. Waldron Labs   Medication changes reported     No   Fall or balance concerns reported    No   Warm-up and Cool-down Performed as group-led instruction   Resistance Training Performed Yes   VAD Patient? No   Pain Assessment   Currently in Pain? No/denies   Multiple Pain Sites No      Capillary Blood Glucose: No results found for this or any previous visit (from the past 24 hour(s)).      Exercise Prescription Changes - 02/29/16 1200    Exercise Review   Progression No   Response to Exercise   Blood Pressure (Admit) 107/51 mmHg   Blood Pressure (Exercise) 110/64 mmHg   Blood Pressure (Exit) 100/68 mmHg   Heart Rate (Admit) 88 bpm   Heart Rate (Exercise) 89 bpm   Heart Rate (Exit) 52 bpm   Oxygen Saturation (Admit) 99 %   Oxygen Saturation (Exercise) 95 %   Oxygen Saturation (Exit) 100 %   Rating of Perceived Exertion (Exercise) 13   Perceived Dyspnea (Exercise) 2   Duration Progress to 45 minutes of aerobic exercise without signs/symptoms of physical distress   Intensity THRR unchanged   Progression   Progression Continue to progress workloads to maintain intensity without signs/symptoms of physical distress.   Resistance Training   Training Prescription Yes   Weight orange bands   Reps 10-12   Interval Training   Interval Training No   Oxygen   Oxygen Continuous   Liters 2   Bike   Level 0.5   Minutes 15   NuStep   Level 2   Minutes 15   METs 2      Goals Met:  Exercise tolerated well Queuing for purse lip breathing No report of cardiac concerns or symptoms  Goals Unmet:  Not Applicable  Comments: Service time is from 1030 to 1215, see paper ITP for education   Dr. Rush Farmer is Medical Director for Pulmonary Rehab at [facility].

## 2016-03-05 ENCOUNTER — Encounter (HOSPITAL_COMMUNITY)
Admission: RE | Admit: 2016-03-05 | Discharge: 2016-03-05 | Disposition: A | Discharge status: Home / Self Care | Payer: Medicare Other | Source: Ambulatory Visit | Attending: Pulmonary Disease | Admitting: Pulmonary Disease

## 2016-03-05 DIAGNOSIS — J439 Emphysema, unspecified: Secondary | ICD-10-CM | POA: Diagnosis not present

## 2016-03-05 NOTE — Progress Notes (Signed)
Daily Session Note  Patient Details  Name: Jose Mckinney MRN: [id number] Date of Birth: 04-09-1946 Referring Provider:  Stephens Shire, MD  Encounter Date: 03/05/2016  Check In:     Session Check In - 03/05/16 1015    Check-In   [facility]   Staff Present Rosebud Poles, RN, Luisa Hart, RN, Levie Heritage, MA, ACSM RCEP, Exercise Physiologist;Annedrea Rosezella [place], RN, [facility]   Supervising physician immediately available to respond to emergencies [facility] immediately available   Physician(s) Dr. Marily Memos   Medication changes reported     No   Fall or balance concerns reported    No   Warm-up and Cool-down Performed as group-led instruction   Resistance Training Performed Yes   VAD Patient? No   Pain Assessment   Currently in Pain? No/denies      Capillary Blood Glucose: No results found for this or any previous visit (from the past 24 hour(s)).      Exercise Prescription Changes - 03/05/16 1200    Exercise Review   Progression No   Response to Exercise   Blood Pressure (Admit) 93/63 mmHg   Blood Pressure (Exercise) 100/54 mmHg   Blood Pressure (Exit) 102/52 mmHg   Heart Rate (Admit) 63 bpm   Heart Rate (Exercise) 66 bpm   Heart Rate (Exit) 60 bpm   Oxygen Saturation (Admit) 95 %   Oxygen Saturation (Exercise) 90 %   Oxygen Saturation (Exit) 98 %   Rating of Perceived Exertion (Exercise) 11   Perceived Dyspnea (Exercise) 1   Duration Progress to 45 minutes of aerobic exercise without signs/symptoms of physical distress   Intensity THRR unchanged   Progression   Progression Continue to progress workloads to maintain intensity without signs/symptoms of physical distress.   Resistance Training   Training Prescription Yes   Weight orange bands   Reps 10-12   Interval Training   Interval Training No   Oxygen   Oxygen Continuous   Liters 2   Bike   Level 0.5   Minutes 15   NuStep   Level 2   Minutes 15   METs 1.9   Track   Laps 5   Minutes 15     Goals Met:  Proper associated with RPD/PD & O2 Sat Independence with exercise equipment Improved SOB with ADL's Exercise tolerated well Queuing for purse lip breathing Strength training completed today  Goals Unmet:  Not Applicable  Comments: Service time is from 1030 to 1230    Dr. Rush Farmer is Medical Director for Pulmonary Rehab at [facility].

## 2016-03-07 ENCOUNTER — Encounter (HOSPITAL_COMMUNITY)
Admission: RE | Admit: 2016-03-07 | Discharge: 2016-03-07 | Disposition: A | Discharge status: Home / Self Care | Payer: Medicare Other | Source: Ambulatory Visit | Attending: Pulmonary Disease | Admitting: Pulmonary Disease

## 2016-03-07 DIAGNOSIS — J439 Emphysema, unspecified: Secondary | ICD-10-CM | POA: Diagnosis not present

## 2016-03-07 NOTE — Progress Notes (Signed)
Daily Session Note  Patient Details  Name: Jose Mckinney MRN: [id number] Date of Birth: Jun 20, 1946 Referring Provider:  Stephens Shire, MD  Encounter Date: 03/07/2016  Check In:     Session Check In - 03/07/16 1035    Check-In   [facility]   Staff Present Rosebud Poles, RN, Fletcher Anon, M.Ed., RD, LDN, CDE, Clinical Nutritionist Lamar Sprinkles, MA, ACSM RCEP, Exercise Physiologist;Lisa Ysidro Evert, Felipe Drone, RN, MHA;Portia Rollene Rotunda, RN, BSN   Supervising physician immediately available to respond to emergencies [facility] immediately available   Physician(s) Dr. Jerilee Hoh   Medication changes reported     No   Fall or balance concerns reported    No   Warm-up and Cool-down Performed as group-led instruction   Resistance Training Performed Yes   VAD Patient? No   Pain Assessment   Currently in Pain? No/denies   Multiple Pain Sites No      Capillary Blood Glucose: No results found for this or any previous visit (from the past 24 hour(s)).      Exercise Prescription Changes - 03/07/16 1300    Exercise Review   Progression Yes   Response to Exercise   Blood Pressure (Admit) 106/60 mmHg   Blood Pressure (Exercise) 106/60 mmHg   Blood Pressure (Exit) 115/59 mmHg   Heart Rate (Admit) 61 bpm   Heart Rate (Exercise) 76 bpm   Heart Rate (Exit) 73 bpm   Oxygen Saturation (Admit) 97 %   Oxygen Saturation (Exercise) 92 %   Oxygen Saturation (Exit) 93 %   Rating of Perceived Exertion (Exercise) 13   Perceived Dyspnea (Exercise) 2   Duration Progress to 45 minutes of aerobic exercise without signs/symptoms of physical distress   Intensity THRR unchanged   Progression   Progression Continue to progress workloads to maintain intensity without signs/symptoms of physical distress.   Resistance Training   Training Prescription Yes   Weight orange bands   Reps 10-12   Interval Training   Interval Training No   Oxygen   Oxygen  Continuous   Liters 2   NuStep   Level 3   Minutes 15   METs 2.9   Track   Laps 9   Minutes 15     Goals Met:  Independence with exercise equipment Improved SOB with ADL's Using PLB without cueing & demonstrates good technique Exercise tolerated well Strength training completed today  Goals Unmet:  Not Applicable  Comments: Service time is from 1030 to 1240  See paper chart for education topic today  Dr. Rush Farmer is Medical Director for Pulmonary Rehab at [facility].

## 2016-03-12 ENCOUNTER — Encounter (HOSPITAL_COMMUNITY)
Admission: RE | Admit: 2016-03-12 | Discharge: 2016-03-12 | Disposition: A | Discharge status: Home / Self Care | Payer: Medicare Other | Source: Ambulatory Visit | Attending: Pulmonary Disease | Admitting: Pulmonary Disease

## 2016-03-12 DIAGNOSIS — J439 Emphysema, unspecified: Secondary | ICD-10-CM | POA: Diagnosis not present

## 2016-03-12 NOTE — Progress Notes (Signed)
Daily Session Note  Patient Details  Name: Jose Mckinney MRN: [id number] Date of Birth: 25-May-1946 Referring Provider:  Stephens Shire, MD  Encounter Date: 03/12/2016  Check In:     Session Check In - 03/12/16 1235    Check-In   [facility]   Staff Present Rosebud Poles, RN, BSN;Ramon Dredge, RN, MHA;Jessica Luan Pulling, MA, ACSM RCEP, Exercise Physiologist;Portia Rollene Rotunda, RN, BSN   Supervising physician immediately available to respond to emergencies [facility] immediately available   Physician(s) Dr. Marily Memos   Medication changes reported     No   Fall or balance concerns reported    No   Warm-up and Cool-down Performed as group-led instruction   Resistance Training Performed Yes   VAD Patient? No   Pain Assessment   Currently in Pain? No/denies   Multiple Pain Sites No      Capillary Blood Glucose: No results found for this or any previous visit (from the past 24 hour(s)).      Exercise Prescription Changes - 03/12/16 1200    Exercise Review   Progression No   Response to Exercise   Blood Pressure (Admit) 92/60 mmHg   Blood Pressure (Exercise) 124/80 mmHg   Blood Pressure (Exit) 104/60 mmHg   Heart Rate (Admit) 56 bpm   Heart Rate (Exercise) 81 bpm   Heart Rate (Exit) 61 bpm   Oxygen Saturation (Admit) 93 %   Oxygen Saturation (Exercise) 88 %   Oxygen Saturation (Exit) 96 %   Rating of Perceived Exertion (Exercise) 11   Perceived Dyspnea (Exercise) 2   Duration Progress to 45 minutes of aerobic exercise without signs/symptoms of physical distress   Intensity THRR unchanged   Progression   Progression Continue to progress workloads to maintain intensity without signs/symptoms of physical distress.   Resistance Training   Training Prescription Yes   Weight orange bands   Reps 10-12   Interval Training   Interval Training No   Oxygen   Oxygen Continuous   Liters 2   Bike   Level 0.5   Minutes 15   NuStep   Level 3   Minutes 15   METs 2.5   Track   Laps 10   Minutes 15     Goals Met:  Using PLB without cueing & demonstrates good technique Exercise tolerated well No report of cardiac concerns or symptoms Strength training completed today  Goals Unmet:  Not Applicable  Comments: Service time is from 1030 to 1215    Dr. Rush Farmer is Medical Director for Pulmonary Rehab at [facility].

## 2016-03-14 ENCOUNTER — Encounter (HOSPITAL_COMMUNITY)
Admission: RE | Admit: 2016-03-14 | Discharge: 2016-03-14 | Disposition: A | Discharge status: Home / Self Care | Payer: Medicare Other | Source: Ambulatory Visit | Attending: Pulmonary Disease | Admitting: Pulmonary Disease

## 2016-03-14 ENCOUNTER — Telehealth: Payer: Self-pay | Admitting: Pulmonary Disease

## 2016-03-14 DIAGNOSIS — J439 Emphysema, unspecified: Secondary | ICD-10-CM | POA: Diagnosis not present

## 2016-03-14 NOTE — Progress Notes (Signed)
Daily Session Note  Patient Details  Name: Jose Mckinney MRN: [id number] Date of Birth: 1946/05/25 Referring Provider:  Stephens Shire, MD  Encounter Date: 03/14/2016  Check In:     Session Check In - 03/14/16 1037    Check-In   [facility]   Staff Present Rosebud Poles, RN, Luisa Hart, RN, BSN;Ramon Dredge, RN, MHA;Jessica Luan Pulling, MA, ACSM RCEP, Exercise Physiologist;Maria Venetia Maxon, RN, BSN   Supervising physician immediately available to respond to emergencies [facility] immediately available   Physician(s) Dr. Waldron Labs   Medication changes reported     No   Fall or balance concerns reported    No   Warm-up and Cool-down Performed as group-led instruction   Resistance Training Performed Yes   VAD Patient? No   Pain Assessment   Currently in Pain? No/denies   Multiple Pain Sites No      Capillary Blood Glucose: No results found for this or any previous visit (from the past 24 hour(s)).      Exercise Prescription Changes - 03/14/16 1200    Exercise Review   Progression No   Response to Exercise   Blood Pressure (Admit) 100/60 mmHg   Blood Pressure (Exercise) 132/58 mmHg   Blood Pressure (Exit) 96/58 mmHg   Heart Rate (Admit) 54 bpm   Heart Rate (Exercise) 79 bpm   Heart Rate (Exit) 87 bpm   Oxygen Saturation (Admit) 96 %   Oxygen Saturation (Exercise) 89 %   Oxygen Saturation (Exit) 92 %   Rating of Perceived Exertion (Exercise) 11   Perceived Dyspnea (Exercise) 2   Duration Progress to 45 minutes of aerobic exercise without signs/symptoms of physical distress   Intensity THRR unchanged   Progression   Progression Continue to progress workloads to maintain intensity without signs/symptoms of physical distress.   Resistance Training   Training Prescription Yes   Weight orange bands   Reps 10-12   Interval Training   Interval Training No   Oxygen   Oxygen Continuous   Liters 2   Bike   Level 0.5   Minutes 15    Track   Laps 11   Minutes 15     Goals Met:  Independence with exercise equipment Using PLB without cueing & demonstrates good technique Exercise tolerated well No report of cardiac concerns or symptoms Strength training completed today  Goals Unmet:  Not Applicable  Comments: Service time is from 1030 to 1220    Dr. Rush Farmer is Medical Director for Pulmonary Rehab at [facility].

## 2016-03-14 NOTE — Telephone Encounter (Signed)
Pt states that Sildenafil (Revatio) '20mg'$  is denied by insurance. Needing to know what alternatives are available.  Pt is completely out of medication and it was denied when they called for refill.  --- [facility] to check on denial information of Revatio - placed on long hold waiting for pharmacist.  Transferred to voicemail for pharmacy - left detailed message for pharmacist.  Will hold in triage to follow up in AM

## 2016-03-15 NOTE — Telephone Encounter (Signed)
Pharm closed- will reopen after 9 am  [facility]

## 2016-03-15 NOTE — Telephone Encounter (Signed)
Spoke with pt's spouse  She states okay to call for PA and she will pick up samples  Will forward back to JJ and MB

## 2016-03-15 NOTE — Telephone Encounter (Signed)
Spoke with pharmacist at [facility]  She states Sildenafil does need PA  [phone]  Pt ID number [id number]  Will forward to [place] and MB since they are working on PA's this pm per JJ's request  Thanks

## 2016-03-15 NOTE — Telephone Encounter (Signed)
Magda Paganini from [employer] for quantity on rx [phone]

## 2016-03-15 NOTE — Telephone Encounter (Signed)
Spoke with Magda Paganini at [facility]  She is needing quantity for sildenafil  I advised # 90 tablets  She states that it still needs PA   Called pt's spouse to advise we will work on [place] and leave some samples of med up front since he is out  She states that the correct pharmacy is not [facility]  I called them to verify that the pt needs PA He does and the number to call is [phone], pt id [id number]  [employer] [facility] and advised them nothing is needed since pt uses [facility]   I called spouse back again and offered to leave samples  She prefers to call [facility] b/c apparently they told her no PA needed?? Will await her to call back

## 2016-03-19 ENCOUNTER — Encounter (HOSPITAL_COMMUNITY)
Admission: RE | Admit: 2016-03-19 | Discharge: 2016-03-19 | Disposition: A | Discharge status: Home / Self Care | Payer: Medicare Other | Source: Ambulatory Visit | Attending: Pulmonary Disease | Admitting: Pulmonary Disease

## 2016-03-19 VITALS — Wt 124.8 lb

## 2016-03-19 DIAGNOSIS — J439 Emphysema, unspecified: Secondary | ICD-10-CM | POA: Diagnosis not present

## 2016-03-19 DIAGNOSIS — J438 Other emphysema: Secondary | ICD-10-CM

## 2016-03-19 NOTE — Telephone Encounter (Signed)
[name], does this just need a PA? Or is there something else that needs to be done with this? Please advise.

## 2016-03-19 NOTE — Progress Notes (Signed)
Daily Session Note  Patient Details  Name: Jose Mckinney MRN: [id number] Date of Birth: 04-10-1946 Referring Provider:  Stephens Shire, MD  Encounter Date: 03/19/2016  Check In:   Capillary Blood Glucose: No results found for this or any previous visit (from the past 24 hour(s)).      Exercise Prescription Changes - 03/19/16 1200    Exercise Review   Progression No   Response to Exercise   Blood Pressure (Admit) 90/52 mmHg   Blood Pressure (Exercise) 110/56 mmHg   Blood Pressure (Exit) 96/60 mmHg   Heart Rate (Admit) 56 bpm   Heart Rate (Exercise) 77 bpm   Heart Rate (Exit) 61 bpm   Oxygen Saturation (Admit) 98 %   Oxygen Saturation (Exercise) 93 %   Oxygen Saturation (Exit) 97 %   Rating of Perceived Exertion (Exercise) 12   Perceived Dyspnea (Exercise) 2   Duration Progress to 45 minutes of aerobic exercise without signs/symptoms of physical distress   Intensity THRR unchanged   Progression   Progression Continue to progress workloads to maintain intensity without signs/symptoms of physical distress.   Resistance Training   Training Prescription Yes   Weight orange bands   Reps 10-12   Interval Training   Interval Training No   Oxygen   Oxygen Continuous   Liters 2   Bike   Level 0.5   Minutes 15   NuStep   Level 3   Minutes 15   METs 2.5   Track   Laps 7   Minutes 15     Goals Met:  Using PLB without cueing & demonstrates good technique Exercise tolerated well No report of cardiac concerns or symptoms Strength training completed today  Goals Unmet:  Not Applicable  Comments: Service time is from 1030 to 1200    Dr. Rush Farmer is Medical Director for Pulmonary Rehab at [facility].

## 2016-03-19 NOTE — Telephone Encounter (Signed)
Sorry, no PA has not been done yet.  Any assistance triage can offer would be appreciated.  Thanks.

## 2016-03-20 NOTE — Telephone Encounter (Signed)
Called and on hold for over 10 min  WCB later

## 2016-03-21 ENCOUNTER — Encounter (HOSPITAL_COMMUNITY)
Admission: RE | Admit: 2016-03-21 | Discharge: 2016-03-21 | Disposition: A | Discharge status: Home / Self Care | Payer: Medicare Other | Source: Ambulatory Visit | Attending: Pulmonary Disease | Admitting: Pulmonary Disease

## 2016-03-21 VITALS — Wt 126.1 lb

## 2016-03-21 DIAGNOSIS — J439 Emphysema, unspecified: Secondary | ICD-10-CM | POA: Diagnosis not present

## 2016-03-21 DIAGNOSIS — J438 Other emphysema: Secondary | ICD-10-CM

## 2016-03-21 NOTE — Progress Notes (Signed)
Daily Session Note  Patient Details  Name: Jose Mckinney MRN: [id number] Date of Birth: [birth year] Referring Provider:  Stephens Shire, MD  Encounter Date: 03/21/2016  Check In:     Session Check In - 03/21/16 1642    Check-In   Location [facility]   Staff Present Su Hilt, MS, ACSM RCEP, Exercise Physiologist;Joan Leonia Reeves, RN, BSN;Lisa Ysidro Evert, RN;Portia Rollene Rotunda, RN, BSN   Supervising physician immediately available to respond to emergencies [facility] immediately available   Physician(s) Dr. Marily Memos   Medication changes reported     No   Fall or balance concerns reported    No   Warm-up and Cool-down Performed as group-led instruction   Resistance Training Performed No   VAD Patient? No   Pain Assessment   Currently in Pain? No/denies   Multiple Pain Sites No      Capillary Blood Glucose: No results found for this or any previous visit (from the past 24 hour(s)).      Exercise Prescription Changes - 03/21/16 1220    Exercise Review   Progression No   Response to Exercise   Blood Pressure (Admit) 100/52 mmHg   Blood Pressure (Exercise) 122/64 mmHg   Blood Pressure (Exit) 112/64 mmHg   Heart Rate (Admit) 61 bpm   Heart Rate (Exercise) 84 bpm   Heart Rate (Exit) 58 bpm   Oxygen Saturation (Admit) 99 %   Oxygen Saturation (Exercise) 93 %   Oxygen Saturation (Exit) 97 %   Rating of Perceived Exertion (Exercise) 11   Perceived Dyspnea (Exercise) 2   Duration Progress to 45 minutes of aerobic exercise without signs/symptoms of physical distress   Intensity THRR unchanged   Progression   Progression Continue to progress workloads to maintain intensity without signs/symptoms of physical distress.   Resistance Training   Training Prescription Yes   Weight orange bands   Reps 10-12   Interval Training   Interval Training No   Oxygen   Oxygen Continuous   Liters 2   Bike   Level 0.5   Minutes 15   NuStep   Level 3   Minutes 15   METs 2.5     Goals Met:  Exercise tolerated well No report of cardiac concerns or symptoms Strength training completed today  Goals Unmet:  Not Applicable  Comments: Service time is from 10:30am to 12:20pm    Dr. Rush Farmer is Medical Director for Pulmonary Rehab at [facility].

## 2016-03-22 NOTE — Telephone Encounter (Signed)
Called for PA [phone], pt id [id number] I spoke with [employer]. She reports they faxed a PA form to our office 03/04/16 and bc this was never received back, PA was denied. The # they faxed the form to is not even our correct fax #. She advised me since this has already been denied this will now have to be appealed regardless if we received the fax for PA or not. I attempted to give correct fax # but was advised since this was already denied, no point in giving correct fax #.  She gave me # to call appeals [phone]. Called spoke with [place] [employer]. Was advised we need to fax letter. Letter needs to include member ID, member name, member address, reason for appeal, any supporting documents and doc signature. This can be faxed to [phone]. Please advise Dr. Halford Chessman thanks

## 2016-03-26 ENCOUNTER — Encounter (HOSPITAL_COMMUNITY)
Admission: RE | Admit: 2016-03-26 | Discharge: 2016-03-26 | Disposition: A | Discharge status: Home / Self Care | Payer: Medicare Other | Source: Ambulatory Visit | Attending: Pulmonary Disease | Admitting: Pulmonary Disease

## 2016-03-26 DIAGNOSIS — J439 Emphysema, unspecified: Secondary | ICD-10-CM | POA: Insufficient documentation

## 2016-03-26 NOTE — Progress Notes (Signed)
Daily Session Note  Patient Details  Name: Jose Mckinney MRN: [id number] Date of Birth: 21-Apr-1946 Referring Provider:  Truitt Merle  Encounter Date: 03/26/2016  Check In:     Session Check In - 03/26/16 1006    Check-In   [facility]   Staff Present Ramon Dredge, RN, MHA;Portia Rollene Rotunda, RN, Levie Heritage, MA, ACSM RCEP, Exercise Physiologist;Lisa Ysidro Evert, RN   Supervising physician immediately available to respond to emergencies [facility] immediately available   Physician(s) Dr. Marily Memos   Medication changes reported     No   Fall or balance concerns reported    No   Warm-up and Cool-down Performed as group-led instruction   Resistance Training Performed Yes   VAD Patient? No   Pain Assessment   Currently in Pain? No/denies   Multiple Pain Sites No      Capillary Blood Glucose: No results found for this or any previous visit (from the past 24 hour(s)).      Exercise Prescription Changes - 03/26/16 1200    Exercise Review   Progression Yes   Response to Exercise   Blood Pressure (Admit) 112/62 mmHg   Blood Pressure (Exercise) 148/80 mmHg   Blood Pressure (Exit) 104/56 mmHg   Heart Rate (Admit) 54 bpm   Heart Rate (Exercise) 90 bpm   Heart Rate (Exit) 64 bpm   Oxygen Saturation (Admit) 100 %   Oxygen Saturation (Exercise) 90 %   Oxygen Saturation (Exit) 98 %   Rating of Perceived Exertion (Exercise) 9   Perceived Dyspnea (Exercise) 2   Duration Progress to 45 minutes of aerobic exercise without signs/symptoms of physical distress   Intensity THRR unchanged   Progression   Progression Continue to progress workloads to maintain intensity without signs/symptoms of physical distress.   Resistance Training   Training Prescription Yes   Weight orange bands   Reps 10-12   Interval Training   Interval Training No   Oxygen   Oxygen Continuous   Liters 2   Bike   Level 0.5   Minutes 15   NuStep   Level 4   Minutes 15   METs 2.4   Track   Laps 11   Minutes 15     Goals Met:  Using PLB without cueing & demonstrates good technique Exercise tolerated well No report of cardiac concerns or symptoms Strength training completed today  Goals Unmet:  Not Applicable  Comments: Service time is from 1030 to 1210    Dr. Rush Farmer is Medical Director for Pulmonary Rehab at [facility].

## 2016-03-28 ENCOUNTER — Encounter (HOSPITAL_COMMUNITY)
Admission: RE | Admit: 2016-03-28 | Discharge: 2016-03-28 | Disposition: A | Discharge status: Home / Self Care | Payer: Medicare Other | Source: Ambulatory Visit | Attending: Pulmonary Disease | Admitting: Pulmonary Disease

## 2016-03-28 ENCOUNTER — Encounter: Payer: Self-pay | Admitting: Pulmonary Disease

## 2016-03-28 VITALS — Wt 126.3 lb

## 2016-03-28 DIAGNOSIS — J438 Other emphysema: Secondary | ICD-10-CM

## 2016-03-28 DIAGNOSIS — J439 Emphysema, unspecified: Secondary | ICD-10-CM | POA: Diagnosis not present

## 2016-03-28 NOTE — Progress Notes (Signed)
Daily Session Note  Patient Details  Name: Jose Mckinney MRN: [id number] Date of Birth: 11-06-1946 Referring Provider:  Chesley Mires, MD  Encounter Date: 03/28/2016  Check In:     Session Check In - 03/28/16 1213    Check-In   [facility]   Staff Present Rosebud Poles, RN, BSN;Molly diVincenzo, MS, ACSM RCEP, Exercise Physiologist;Bre Pecina Rollene Rotunda, RN, Roque Cash, RN   Supervising physician immediately available to respond to emergencies [facility] immediately available   Physician(s) Dr. Loleta Books   Medication changes reported     No   Fall or balance concerns reported    No   Warm-up and Cool-down Performed as group-led instruction   Resistance Training Performed Yes   VAD Patient? No   Pain Assessment   Currently in Pain? No/denies   Multiple Pain Sites No      Capillary Blood Glucose: No results found for this or any previous visit (from the past 24 hour(s)).      Exercise Prescription Changes - 03/28/16 1200    Exercise Review   Progression No   Response to Exercise   Blood Pressure (Admit) 90/62 mmHg   Blood Pressure (Exercise) 94/54 mmHg   Blood Pressure (Exit) 96/50 mmHg   Heart Rate (Admit) 59 bpm   Heart Rate (Exercise) 92 bpm   Heart Rate (Exit) 68 bpm   Oxygen Saturation (Admit) 97 %   Oxygen Saturation (Exercise) 86 %  increased to 92   Oxygen Saturation (Exit) 95 %   Rating of Perceived Exertion (Exercise) 9   Perceived Dyspnea (Exercise) 2   Duration Progress to 45 minutes of aerobic exercise without signs/symptoms of physical distress   Intensity THRR unchanged   Progression   Progression Continue to progress workloads to maintain intensity without signs/symptoms of physical distress.   Resistance Training   Training Prescription Yes   Weight orange bands   Reps 10-12   Interval Training   Interval Training No   Oxygen   Oxygen Continuous   Liters 2   Bike   Level 0.5   Minutes 15   NuStep   Level 4   Minutes 15   METs 2.9     Goals Met:  Improved SOB with ADL's Using PLB without cueing & demonstrates good technique No report of cardiac concerns or symptoms Strength training completed today  Goals Unmet:  Not Applicable  Comments: Service time is from 1030 to 1230   Dr. Rush Farmer is Medical Director for Pulmonary Rehab at [facility].

## 2016-03-28 NOTE — Telephone Encounter (Signed)
VS please advise on letter.  Thanks!

## 2016-03-28 NOTE — Telephone Encounter (Signed)
I have typed letter.  Letter needs to be printed, and then I can sign it when I am in office next.

## 2016-03-28 NOTE — Telephone Encounter (Signed)
Letter printed and placed in VS look-at folder for signature. Routing to [employer] for follow-up.

## 2016-03-29 NOTE — Telephone Encounter (Signed)
Patient wife is calling to speak to nurse and give more info to nurse [phone]

## 2016-03-29 NOTE — Telephone Encounter (Signed)
LMOM TCB x1 to see about the additional info mentioned below Will route back to [place] to ensure follow up on the previous message while waiting for spouse to return call.

## 2016-04-01 NOTE — Telephone Encounter (Signed)
Spoke with patient wife, wanting to check and make sure certain information is int he Appeal Letter for Revatio(Sildenafil). Wife states that the Denial # needs to be added to the letter and would like to make sure that the medication is listed as Sildenafil. Letter has ben reprinted and placed in VS look at to be signed upon his return. Letter will need to be faxed to [phone] Denial # [id number]  Member ID [id number] Will hold in my box to ensure follow up.

## 2016-04-02 ENCOUNTER — Encounter (HOSPITAL_COMMUNITY)
Admission: RE | Admit: 2016-04-02 | Discharge: 2016-04-02 | Disposition: A | Discharge status: Home / Self Care | Payer: Medicare Other | Source: Ambulatory Visit | Attending: Pulmonary Disease | Admitting: Pulmonary Disease

## 2016-04-02 VITALS — Wt 124.1 lb

## 2016-04-02 DIAGNOSIS — J439 Emphysema, unspecified: Secondary | ICD-10-CM | POA: Diagnosis not present

## 2016-04-02 DIAGNOSIS — J438 Other emphysema: Secondary | ICD-10-CM

## 2016-04-02 NOTE — Progress Notes (Signed)
Daily Session Note  Patient Details  Name: Jose Mckinney MRN: [id number] Date of Birth: [birth year] Referring Provider:  Chesley Mires, MD  Encounter Date: 04/02/2016  Check In:     Session Check In - 04/02/16 1102    Check-In   [facility]   Staff Present Rodney Langton, RN;Portia Rollene Rotunda, RN, Deland Pretty, MS, ACSM CEP, Exercise Physiologist;Molly diVincenzo, MS, ACSM RCEP, Exercise Physiologist   Supervising physician immediately available to respond to emergencies [facility] immediately available   Physician(s) Dr. Marily Memos   Medication changes reported     No   Fall or balance concerns reported    No   Warm-up and Cool-down Performed as group-led instruction   Resistance Training Performed Yes   VAD Patient? No   Pain Assessment   Currently in Pain? No/denies   Multiple Pain Sites No      Capillary Blood Glucose: No results found for this or any previous visit (from the past 24 hour(s)).      Exercise Prescription Changes - 04/02/16 1200    Exercise Review   Progression No   Response to Exercise   Blood Pressure (Admit) 96/58 mmHg   Blood Pressure (Exercise) 104/60 mmHg   Blood Pressure (Exit) 114/70 mmHg   Heart Rate (Admit) 51 bpm   Heart Rate (Exercise) 73 bpm   Heart Rate (Exit) 59 bpm   Oxygen Saturation (Admit) 99 %   Oxygen Saturation (Exercise) 89 %   Oxygen Saturation (Exit) 96 %   Rating of Perceived Exertion (Exercise) 9   Perceived Dyspnea (Exercise) 2   Duration Progress to 45 minutes of aerobic exercise without signs/symptoms of physical distress   Intensity THRR unchanged   Progression   Progression Continue to progress workloads to maintain intensity without signs/symptoms of physical distress.   Resistance Training   Training Prescription Yes   Weight orange bands   Reps 10-12   Interval Training   Interval Training No   Oxygen   Oxygen Continuous   Liters 2   Bike   Level 0.5   Minutes 15   NuStep   Level 4   Minutes 15   METs 2.7   Track   Laps 8   Minutes 15     Goals Met:  Using PLB without cueing & demonstrates good technique Exercise tolerated well No report of cardiac concerns or symptoms Strength training completed today  Goals Unmet:  Not Applicable  Comments: Service time is from 1030 to 1205     Dr. Rush Farmer is Medical Director for Pulmonary Rehab at [facility].

## 2016-04-04 ENCOUNTER — Encounter (HOSPITAL_COMMUNITY)
Admission: RE | Admit: 2016-04-04 | Discharge: 2016-04-04 | Disposition: A | Discharge status: Home / Self Care | Payer: Medicare Other | Source: Ambulatory Visit | Attending: Pulmonary Disease | Admitting: Pulmonary Disease

## 2016-04-04 VITALS — Wt 123.0 lb

## 2016-04-04 DIAGNOSIS — J438 Other emphysema: Secondary | ICD-10-CM

## 2016-04-04 DIAGNOSIS — J439 Emphysema, unspecified: Secondary | ICD-10-CM | POA: Diagnosis not present

## 2016-04-04 NOTE — Telephone Encounter (Signed)
Appeal Letter faxed to [phone] Received confirmation. Nothing further needed.

## 2016-04-04 NOTE — Progress Notes (Signed)
Daily Session Note  Patient Details  Name: Jose Mckinney MRN: [id number] Date of Birth: 1946-04-25 Referring Provider:    Encounter Date: 04/04/2016  Check In:     Session Check In - 04/04/16 1214    Check-In   [facility]   Staff Present Su Hilt, MS, ACSM RCEP, Exercise Physiologist;Portia Rollene Rotunda, Therapist, sports, BSN;Ramon Dredge, RN, MHA;Maria Whitaker, RN, BSN   Supervising physician immediately available to respond to emergencies [facility] immediately available   Physician(s) Dr. Aggie Moats   Medication changes reported     No   Fall or balance concerns reported    No   Warm-up and Cool-down Performed as group-led instruction   Resistance Training Performed Yes   VAD Patient? No   Pain Assessment   Currently in Pain? No/denies   Multiple Pain Sites No      Capillary Blood Glucose: No results found for this or any previous visit (from the past 24 hour(s)).      Exercise Prescription Changes - 04/04/16 1200    Response to Exercise   Blood Pressure (Admit) 92/62 mmHg   Blood Pressure (Exercise) 108/60 mmHg   Blood Pressure (Exit) 105/64 mmHg   Heart Rate (Admit) 56 bpm   Heart Rate (Exercise) 66 bpm   Heart Rate (Exit) 60 bpm   Oxygen Saturation (Admit) 98 %   Oxygen Saturation (Exercise) 92 %   Oxygen Saturation (Exit) 100 %   Rating of Perceived Exertion (Exercise) 9   Perceived Dyspnea (Exercise) 2   Duration Progress to 45 minutes of aerobic exercise without signs/symptoms of physical distress   Intensity THRR unchanged   Progression   Progression Continue to progress workloads to maintain intensity without signs/symptoms of physical distress.   Resistance Training   Training Prescription Yes   Weight orange bands   Reps 10-12   Interval Training   Interval Training No   Oxygen   Oxygen Continuous   Liters 2   NuStep   Level 4   Minutes 15   METs 2.8   Track   Laps 8   Minutes 15     Goals Met:  Exercise  tolerated well Queuing for purse lip breathing No report of cardiac concerns or symptoms Strength training completed today  Goals Unmet:  Not Applicable  Comments: Service time is from 10:30am to 12:10pm    Dr. Rush Farmer is Medical Director for Pulmonary Rehab at [facility].

## 2016-04-08 ENCOUNTER — Telehealth: Payer: Self-pay | Admitting: Pulmonary Disease

## 2016-04-08 NOTE — Telephone Encounter (Signed)
Called number given and "the number you have dialed is not in service at this time" [facility]

## 2016-04-09 ENCOUNTER — Encounter (HOSPITAL_COMMUNITY)
Admission: RE | Admit: 2016-04-09 | Discharge: 2016-04-09 | Disposition: A | Discharge status: Home / Self Care | Payer: Medicare Other | Source: Ambulatory Visit | Attending: Pulmonary Disease | Admitting: Pulmonary Disease

## 2016-04-09 VITALS — Wt 121.7 lb

## 2016-04-09 DIAGNOSIS — J438 Other emphysema: Secondary | ICD-10-CM

## 2016-04-09 DIAGNOSIS — J439 Emphysema, unspecified: Secondary | ICD-10-CM | POA: Diagnosis not present

## 2016-04-09 NOTE — Progress Notes (Signed)
Daily Session Note  Patient Details  Name: Jose Mckinney MRN: [id number] Date of Birth: 03/16/1946 Referring Provider:    Encounter Date: 04/09/2016  Check In:     Session Check In - 04/09/16 1221    Check-In   [facility]   Staff Present Rosebud Poles, RN, BSN;Ramon Dredge, RN, MHA;Subrina Vecchiarelli Rollene Rotunda, RN, BSN;Molly diVincenzo, MS, ACSM RCEP, Exercise Physiologist   Supervising physician immediately available to respond to emergencies [facility] immediately available   Physician(s) Dr. Marily Memos   Medication changes reported     No   Fall or balance concerns reported    No   Warm-up and Cool-down Performed as group-led instruction   Resistance Training Performed Yes   VAD Patient? No   Pain Assessment   Currently in Pain? No/denies      Capillary Blood Glucose: No results found for this or any previous visit (from the past 24 hour(s)).      Exercise Prescription Changes - 04/09/16 1210    Exercise Review   Progression Yes   Response to Exercise   Blood Pressure (Admit) 96/56 mmHg   Blood Pressure (Exercise) 110/70 mmHg   Blood Pressure (Exit) 110/66 mmHg   Heart Rate (Admit) 66 bpm   Heart Rate (Exercise) 84 bpm   Heart Rate (Exit) 60 bpm   Oxygen Saturation (Admit) 94 %   Oxygen Saturation (Exercise) 90 %   Oxygen Saturation (Exit) 97 %   Rating of Perceived Exertion (Exercise) 11   Perceived Dyspnea (Exercise) 2   Duration Progress to 45 minutes of aerobic exercise without signs/symptoms of physical distress   Intensity THRR unchanged   Progression   Progression Continue to progress workloads to maintain intensity without signs/symptoms of physical distress.   Resistance Training   Training Prescription Yes   Weight orange bands   Reps 10-12   Interval Training   Interval Training No   Oxygen   Oxygen Continuous   Liters 2   Bike   Level 0.5   Minutes 15   NuStep   Level 5   Minutes 15   METs 3.1   Track   Laps 8   Minutes 15     Goals Met:  Improved SOB with ADL's Using PLB without cueing & demonstrates good technique Exercise tolerated well No report of cardiac concerns or symptoms Strength training completed today  Goals Unmet:  Weight-Patient continues to looses weight. Pt states he continues to eat high calorie foods including full fat dairy. Will discuss weight loss with RD and request she speak with patient again.  Comments: Service time is from 1030 to 1210   Dr. Rush Farmer is Medical Director for Pulmonary Rehab at [facility].

## 2016-04-09 NOTE — Telephone Encounter (Signed)
Spoke with Lanelle Bal at [facility], states that she has a few clinical questions regarding completing the appeals process for the patient's Revatio. Lanelle Bal states that she is sending this for clinical review and we should be hearing from them within the next couple days with a determination. Will await call back.

## 2016-04-09 NOTE — Telephone Encounter (Signed)
atc X2, received same message as below.  wcb.

## 2016-04-11 ENCOUNTER — Encounter (HOSPITAL_COMMUNITY)
Admission: RE | Admit: 2016-04-11 | Discharge: 2016-04-11 | Disposition: A | Discharge status: Home / Self Care | Payer: Medicare Other | Source: Ambulatory Visit | Attending: Pulmonary Disease | Admitting: Pulmonary Disease

## 2016-04-11 VITALS — Wt 122.1 lb

## 2016-04-11 DIAGNOSIS — J438 Other emphysema: Secondary | ICD-10-CM

## 2016-04-11 DIAGNOSIS — J439 Emphysema, unspecified: Secondary | ICD-10-CM | POA: Diagnosis not present

## 2016-04-11 NOTE — Progress Notes (Signed)
Daily Session Note  Patient Details  Name: Jose Mckinney MRN: [id number] Date of Birth: [birth year] Referring Provider:    Encounter Date: 04/11/2016  Check In:     Session Check In - 04/11/16 1107    Check-In   [facility]   Staff Present Rosebud Poles, RN, BSN;Molly diVincenzo, MS, ACSM RCEP, Exercise Physiologist;Lisa Ysidro Evert, RN;Portia Rollene Rotunda, RN, BSN   Supervising physician immediately available to respond to emergencies [facility] immediately available   Physician(s) Dr. Marily Memos   Medication changes reported     No   Fall or balance concerns reported    No   Warm-up and Cool-down Performed as group-led instruction   Resistance Training Performed Yes   VAD Patient? No   Pain Assessment   Currently in Pain? No/denies   Multiple Pain Sites No      Capillary Blood Glucose: No results found for this or any previous visit (from the past 24 hour(s)).      Exercise Prescription Changes - 04/11/16 1200    Response to Exercise   Blood Pressure (Admit) 102/60 mmHg   Blood Pressure (Exercise) 110/60 mmHg   Blood Pressure (Exit) 108/70 mmHg   Heart Rate (Admit) 60 bpm   Heart Rate (Exercise) 82 bpm   Heart Rate (Exit) 63 bpm   Oxygen Saturation (Admit) 95 %   Oxygen Saturation (Exercise) 91 %   Oxygen Saturation (Exit) 100 %   Rating of Perceived Exertion (Exercise) 9   Perceived Dyspnea (Exercise) 1   Duration Progress to 45 minutes of aerobic exercise without signs/symptoms of physical distress   Intensity THRR unchanged   Progression   Progression Continue to progress workloads to maintain intensity without signs/symptoms of physical distress.   Resistance Training   Training Prescription Yes   Weight orange bands   Reps 10-12   Interval Training   Interval Training No   Oxygen   Oxygen Continuous   Liters 2   Bike   Level 0.5   Minutes 15   Track   Laps 10   Minutes 15     Goals Met:  Independence with exercise  equipment Improved SOB with ADL's Exercise tolerated well Strength training completed today  Goals Unmet:  Not Applicable  Comments: Service time is from 1030 to 1230    Dr. Rush Farmer is Medical Director for Pulmonary Rehab at [facility].

## 2016-04-12 NOTE — Telephone Encounter (Addendum)
We received a form from [facility] regarding the appeal for Sildenafil.  Dr Halford Chessman filled this out and gave this to me today.  Forms faxed back to [phone].

## 2016-04-12 NOTE — Telephone Encounter (Signed)
Called and spoke with Jose Mckinney at [facility]. [phone] Ext [phone])  Sildenafil '20mg'$  has been approved for 6 months - From 04/09/16 - 10/09/16

## 2016-04-12 NOTE — Telephone Encounter (Signed)
Jose Mckinney - please advise if you received response.

## 2016-04-16 ENCOUNTER — Telehealth: Payer: Self-pay | Admitting: Pulmonary Disease

## 2016-04-16 ENCOUNTER — Encounter: Payer: Self-pay | Admitting: Pulmonary Disease

## 2016-04-16 ENCOUNTER — Ambulatory Visit (INDEPENDENT_AMBULATORY_CARE_PROVIDER_SITE_OTHER): Payer: Medicare Other | Admitting: Pulmonary Disease

## 2016-04-16 ENCOUNTER — Encounter (HOSPITAL_COMMUNITY)
Admission: RE | Admit: 2016-04-16 | Discharge: 2016-04-16 | Disposition: A | Discharge status: Home / Self Care | Payer: Medicare Other | Source: Ambulatory Visit | Attending: Pulmonary Disease | Admitting: Pulmonary Disease

## 2016-04-16 VITALS — Wt 123.7 lb

## 2016-04-16 VITALS — BP 94/62 | HR 68 | Ht 67.75 in | Wt 123.8 lb

## 2016-04-16 DIAGNOSIS — J9611 Chronic respiratory failure with hypoxia: Secondary | ICD-10-CM

## 2016-04-16 DIAGNOSIS — R64 Cachexia: Secondary | ICD-10-CM | POA: Diagnosis not present

## 2016-04-16 DIAGNOSIS — J439 Emphysema, unspecified: Secondary | ICD-10-CM | POA: Diagnosis not present

## 2016-04-16 DIAGNOSIS — I272 Other secondary pulmonary hypertension: Secondary | ICD-10-CM | POA: Diagnosis not present

## 2016-04-16 DIAGNOSIS — J432 Centrilobular emphysema: Secondary | ICD-10-CM

## 2016-04-16 DIAGNOSIS — J438 Other emphysema: Secondary | ICD-10-CM

## 2016-04-16 MED ORDER — SILDENAFIL CITRATE 20 MG PO TABS: 20.0000 mg | ORAL_TABLET | Freq: Three times a day (TID) | ORAL | Status: DC

## 2016-04-16 NOTE — Progress Notes (Signed)
Daily Session Note  Patient Details  Name: Jose Mckinney MRN: [id number] Date of Birth: 04-02-1946 Referring Provider:    Encounter Date: 04/16/2016  Check In:     Session Check In - 04/16/16 1240    Check-In   [facility]   Staff Present Su Hilt, MS, ACSM RCEP, Exercise Physiologist;Joan Leonia Reeves, RN, BSN;Lisa Hughes, RN;Portia Russell, RN, BSN;Joann Rion, RN, BSN   Supervising physician immediately available to respond to emergencies [facility] immediately available   Physician(s) Dr. Marily Memos   Medication changes reported     No   Fall or balance concerns reported    No   Warm-up and Cool-down Performed as group-led instruction   Resistance Training Performed Yes   VAD Patient? No   Pain Assessment   Currently in Pain? No/denies   Multiple Pain Sites No      Capillary Blood Glucose: No results found for this or any previous visit (from the past 24 hour(s)).      Exercise Prescription Changes - 04/16/16 1200    Response to Exercise   Blood Pressure (Admit) 126/70 mmHg   Blood Pressure (Exercise) 98/60 mmHg   Blood Pressure (Exit) 102/64 mmHg   Heart Rate (Admit) 68 bpm   Heart Rate (Exercise) 92 bpm   Heart Rate (Exit) 61 bpm   Oxygen Saturation (Admit) 94 %   Oxygen Saturation (Exercise) 90 %   Oxygen Saturation (Exit) 96 %   Rating of Perceived Exertion (Exercise) 11   Perceived Dyspnea (Exercise) 2   Duration Progress to 45 minutes of aerobic exercise without signs/symptoms of physical distress   Intensity THRR unchanged   Progression   Progression Continue to progress workloads to maintain intensity without signs/symptoms of physical distress.   Resistance Training   Training Prescription Yes   Weight orange bands   Reps 10-12   Interval Training   Interval Training No   Oxygen   Oxygen Continuous   Liters 2   Bike   Level 0.5   Minutes 15   NuStep   Level 5   Minutes 15   METs 1.7   Track   Laps 10   Minutes 15     Goals Met:  Exercise tolerated well No report of cardiac concerns or symptoms Strength training completed today  Goals Unmet:  Not Applicable  Comments: Service time is from 10:30am to 12:05pm     Dr. Rush Farmer is Medical Director for Pulmonary Rehab at [facility].

## 2016-04-16 NOTE — Telephone Encounter (Signed)
Okay to send order for POC.  He uses 2 liters oxygen 24/7.

## 2016-04-16 NOTE — Progress Notes (Signed)
Current Outpatient Prescriptions on File Prior to Visit  Medication Sig  . albuterol (PROVENTIL) (2.5 MG/3ML) 0.083% nebulizer solution Take 3 mLs (2.5 mg total) by nebulization every 6 (six) hours as needed for wheezing or shortness of breath.  . feeding supplement (BOOST HIGH PROTEIN) LIQD Take 1 Container by mouth daily.   Marland Kitchen HYDROcodone-acetaminophen (NORCO) 7.5-325 MG tablet Take 1 tablet by mouth every 6 (six) hours as needed for moderate pain.   Marland Kitchen ipratropium (ATROVENT) 0.02 % nebulizer solution Take 2.5 mLs (0.5 mg total) by nebulization 4 (four) times daily.  Marland Kitchen losartan (COZAAR) 100 MG tablet Take 100 mg by mouth daily.  . metoprolol succinate (TOPROL-XL) 100 MG 24 hr tablet Take 100 mg by mouth daily.   . sertraline (ZOLOFT) 50 MG tablet Take 50 mg by mouth 2 (two) times daily.  Marland Kitchen torsemide (DEMADEX) 20 MG tablet Take 20 mg by mouth daily.   No current facility-administered medications on file prior to visit.     Chief Complaint  Patient presents with  . Follow-up    Pt notes some improvement since last OV in breathing. Pt states that Pulm rehab has helped a lot with his exertional breathing.     Tests CT chest 05/30/14 >> loculated Lt effusion, free flowing Rt effusion Echo 06/09/14 >> EF 60 to 46%, grade 1 diastolic dysfx, mild MR  Past medical hx HTN, Depression, Anxiety, Sciatica  Past surgical hx, Allergies, Family hx, Social hx all reviewed.  Vital Signs BP 94/62 mmHg  Pulse 68  Ht 5' 7.75" (1.721 m)  Wt 123 lb 12.8 oz (56.155 kg)  BMI 18.96 kg/m2  SpO2 92%  History of Present Illness Jose Mckinney is a 70 y.o. male former smoker with COPD/emphysema, chronic loculated pleural effusion, and pulmonary hypertension.  He feels pulmonary rehab has helped  He has not needed to use his inhalers much.  He is using 2 liters oxygen.  He is still having trouble keeping his weight up.  He has noticed his blood pressure has been running low.  Physical  Exam  General - No distress, thin ENT - No sinus tenderness, no oral exudate, no LAN Cardiac - s1s2 regular, no murmur Chest - decreased breath sounds, no wheeze Back - No focal tenderness Abd - Soft, non-tender Ext - No edema Neuro - Normal strength Skin - No rashes Psych - normal mood, and behavior   Assessment/Plan  COPD/emphysema. - continue prn albuterol/ipratopium via nebulizer >> he is not able to afford other inhalers - continue pulmonary rehab  Chronic loculated left pleural effusion. - monitor clinically and f/u CXR if symptoms progress  Pulmonary hypertension. - will continue revatio since he has been on this for a while, and seems to tolerate therapy with some improvement  Low blood pressure. - advised him to d/w his PCP about whether he could have toprol or cozaar dose adjusted  Cachexia. - discussed options to help maintain his weight    Patient Instructions  Follow up in 6 months     Chesley Mires, MD [facility] Pager:  [phone] 04/16/2016, 5:11 PM

## 2016-04-16 NOTE — Telephone Encounter (Signed)
Pt seen today and forgot to ask Dr Halford Chessman a question regarding O2 Pt is interested in getting a portable concentrator - POC Please advise if okay to send order to [place] for POC eval. Thanks.

## 2016-04-16 NOTE — Patient Instructions (Signed)
Follow up in 6 months 

## 2016-04-16 NOTE — Telephone Encounter (Signed)
Order placed for POC eval to Jose Mckinney.  LM for patient to call back x 1

## 2016-04-18 ENCOUNTER — Encounter (HOSPITAL_COMMUNITY)
Admission: RE | Admit: 2016-04-18 | Discharge: 2016-04-18 | Disposition: A | Discharge status: Home / Self Care | Payer: Medicare Other | Source: Ambulatory Visit | Attending: Pulmonary Disease | Admitting: Pulmonary Disease

## 2016-04-18 VITALS — Wt 127.4 lb

## 2016-04-18 DIAGNOSIS — J439 Emphysema, unspecified: Secondary | ICD-10-CM | POA: Diagnosis not present

## 2016-04-18 DIAGNOSIS — J438 Other emphysema: Secondary | ICD-10-CM

## 2016-04-18 NOTE — Progress Notes (Signed)
Daily Session Note  Patient Details  Name: Jose Mckinney MRN: [id number] Date of Birth: October 24, 1946 Referring Provider:    Encounter Date: 04/18/2016  Check In:     Session Check In - 04/18/16 1231    Check-In   Location [facility]   Staff Present Rosebud Poles, RN, BSN;Lisa Ysidro Evert, RN;Portia Rollene Rotunda, RN, BSN;Ramon Dredge, RN, MHA;Molly diVincenzo, MS, ACSM RCEP, Exercise Physiologist   Supervising physician immediately available to respond to emergencies [facility] immediately available   Physician(s) Dr. Eliseo Squires   Medication changes reported     No   Fall or balance concerns reported    No   Warm-up and Cool-down Performed as group-led instruction   Resistance Training Performed Yes   VAD Patient? No   Pain Assessment   Currently in Pain? No/denies   Multiple Pain Sites No      Capillary Blood Glucose: No results found for this or any previous visit (from the past 24 hour(s)).      Exercise Prescription Changes - 04/18/16 1200    Response to Exercise   Blood Pressure (Admit) 110/62 mmHg   Blood Pressure (Exercise) 112/60 mmHg   Blood Pressure (Exit) 122/62 mmHg   Heart Rate (Admit) 62 bpm   Heart Rate (Exercise) 83 bpm   Heart Rate (Exit) 66 bpm   Oxygen Saturation (Admit) 97 %   Oxygen Saturation (Exercise) 95 %   Oxygen Saturation (Exit) 98 %   Rating of Perceived Exertion (Exercise) 11   Perceived Dyspnea (Exercise) 2   Duration Progress to 45 minutes of aerobic exercise without signs/symptoms of physical distress   Intensity THRR unchanged   Progression   Progression Continue to progress workloads to maintain intensity without signs/symptoms of physical distress.   Resistance Training   Training Prescription Yes   Weight orange bands   Reps 10-12   Interval Training   Interval Training No   Oxygen   Oxygen Continuous   Liters 2   Bike   Level 0.5   Minutes 15   NuStep   Level 5   Minutes 15   METs 2.7   Track   Laps 15    Minutes 15     Goals Met:  Independence with exercise equipment Improved SOB with ADL's Exercise tolerated well Strength training completed today  Goals Unmet:  Not Applicable  Comments: Service time is from 1030 to 1205    Dr. Rush Farmer is Medical Director for Pulmonary Rehab at [facility].

## 2016-04-19 NOTE — Telephone Encounter (Signed)
Patient returned call, CB is [phone]

## 2016-04-19 NOTE — Telephone Encounter (Signed)
Pt is aware that we have placed this order. Nothing further was needed.

## 2016-04-19 NOTE — Telephone Encounter (Signed)
LM x 2 for pt 

## 2016-04-23 ENCOUNTER — Encounter (HOSPITAL_COMMUNITY)
Admission: RE | Admit: 2016-04-23 | Discharge: 2016-04-23 | Disposition: A | Discharge status: Home / Self Care | Payer: Medicare Other | Source: Ambulatory Visit | Attending: Pulmonary Disease | Admitting: Pulmonary Disease

## 2016-04-23 VITALS — Wt 124.6 lb

## 2016-04-23 DIAGNOSIS — J438 Other emphysema: Secondary | ICD-10-CM

## 2016-04-23 DIAGNOSIS — J439 Emphysema, unspecified: Secondary | ICD-10-CM | POA: Diagnosis not present

## 2016-04-23 NOTE — Progress Notes (Signed)
Daily Session Note  Patient Details  Name: Jose Mckinney MRN: [id number] Date of Birth: Jan 14, 1946 Referring Provider:    Encounter Date: 04/23/2016  Check In:     Session Check In - 04/23/16 1030    Check-In   [facility]   Staff Present Rosebud Poles, RN, BSN;Molly diVincenzo, MS, ACSM RCEP, Exercise Physiologist;Olinty Celesta Aver, MS, ACSM CEP, Exercise Physiologist;Tattiana Fakhouri Rollene Rotunda, RN, BSN   Supervising physician immediately available to respond to emergencies [facility] immediately available   Physician(s) Dr. Marily Memos   Medication changes reported     No   Fall or balance concerns reported    No   Warm-up and Cool-down Performed as group-led instruction   Resistance Training Performed Yes   VAD Patient? No   Pain Assessment   Currently in Pain? No/denies   Multiple Pain Sites No      Capillary Blood Glucose: No results found for this or any previous visit (from the past 24 hour(s)).      Exercise Prescription Changes - 04/23/16 1200    Exercise Review   Progression Yes   Response to Exercise   Blood Pressure (Admit) 114/54 mmHg   Blood Pressure (Exercise) 96/50 mmHg   Blood Pressure (Exit) 100/50 mmHg   Heart Rate (Admit) 53 bpm   Heart Rate (Exercise) 72 bpm   Heart Rate (Exit) 55 bpm   Oxygen Saturation (Admit) 100 %   Oxygen Saturation (Exercise) 98 %   Oxygen Saturation (Exit) 100 %   Rating of Perceived Exertion (Exercise) 9   Perceived Dyspnea (Exercise) 2   Duration Progress to 45 minutes of aerobic exercise without signs/symptoms of physical distress   Intensity THRR unchanged   Progression   Progression Continue to progress workloads to maintain intensity without signs/symptoms of physical distress.   Resistance Training   Training Prescription Yes   Weight orange bands   Reps 10-12   Interval Training   Interval Training No   Oxygen   Oxygen Continuous   Liters 2   Bike   Level 0.8   Minutes 15   NuStep   Level 6    Minutes 15   METs 2.9   Track   Laps 13   Minutes 15     Goals Met:  Improved SOB with ADL's Using PLB without cueing & demonstrates good technique Exercise tolerated well No report of cardiac concerns or symptoms Strength training completed today  Goals Unmet:  Not Applicable  Comments: Service time is from 1030 to 1210   Dr. Rush Farmer is Medical Director for Pulmonary Rehab at [facility].

## 2016-04-25 ENCOUNTER — Encounter (HOSPITAL_COMMUNITY)
Admission: RE | Admit: 2016-04-25 | Discharge: 2016-04-25 | Disposition: A | Discharge status: Home / Self Care | Payer: Medicare Other | Source: Ambulatory Visit | Attending: Pulmonary Disease | Admitting: Pulmonary Disease

## 2016-04-25 VITALS — Wt 123.7 lb

## 2016-04-25 DIAGNOSIS — J439 Emphysema, unspecified: Secondary | ICD-10-CM | POA: Diagnosis not present

## 2016-04-25 DIAGNOSIS — J438 Other emphysema: Secondary | ICD-10-CM

## 2016-04-25 NOTE — Progress Notes (Signed)
Daily Session Note  Patient Details  Name: Jose Mckinney MRN: [id number] Date of Birth: September 28, 1946 Referring Provider:    Encounter Date: 04/25/2016  Check In:     Session Check In - 04/25/16 1328    Check-In   [facility]   Staff Present Su Hilt, MS, ACSM RCEP, Exercise Physiologist;Joan Leonia Reeves, RN, Luisa Hart, RN, Roque Cash, RN   Supervising physician immediately available to respond to emergencies [facility] immediately available   Physician(s) Dr. Marlan Palau   Medication changes reported     No   Fall or balance concerns reported    No   Warm-up and Cool-down Performed as group-led instruction   Resistance Training Performed Yes   VAD Patient? No   Pain Assessment   Currently in Pain? No/denies   Multiple Pain Sites No      Capillary Blood Glucose: No results found for this or any previous visit (from the past 24 hour(s)).      Exercise Prescription Changes - 04/25/16 1300    Response to Exercise   Blood Pressure (Admit) 100/56 mmHg   Blood Pressure (Exercise) 114/60 mmHg   Blood Pressure (Exit) 100/64 mmHg   Heart Rate (Admit) 58 bpm   Heart Rate (Exercise) 85 bpm   Heart Rate (Exit) 62 bpm   Oxygen Saturation (Admit) 100 %   Oxygen Saturation (Exercise) 64 %   Oxygen Saturation (Exit) 99 %   Rating of Perceived Exertion (Exercise) 9   Perceived Dyspnea (Exercise) 2   Duration Progress to 45 minutes of aerobic exercise without signs/symptoms of physical distress   Intensity THRR unchanged   Progression   Progression Continue to progress workloads to maintain intensity without signs/symptoms of physical distress.   Resistance Training   Training Prescription Yes   Weight orange bands   Reps 10-12   Interval Training   Interval Training No   Oxygen   Oxygen Continuous   Liters 2   Bike   Level 0.8   Minutes 15   Track   Laps 16   Minutes 15     Goals Met:  Exercise tolerated well No report of  cardiac concerns or symptoms Strength training completed today  Goals Unmet:  Not Applicable  Comments: Service time is from 1030 to 1230    Dr. Rush Farmer is Medical Director for Pulmonary Rehab at [facility].

## 2016-04-30 ENCOUNTER — Encounter (HOSPITAL_COMMUNITY): Payer: Self-pay

## 2016-04-30 ENCOUNTER — Encounter (HOSPITAL_COMMUNITY)
Admission: RE | Admit: 2016-04-30 | Discharge: 2016-04-30 | Disposition: A | Discharge status: Home / Self Care | Payer: Medicare Other | Source: Ambulatory Visit | Attending: Pulmonary Disease | Admitting: Pulmonary Disease

## 2016-04-30 VITALS — Wt 126.1 lb

## 2016-04-30 DIAGNOSIS — J439 Emphysema, unspecified: Secondary | ICD-10-CM | POA: Diagnosis not present

## 2016-04-30 DIAGNOSIS — J438 Other emphysema: Secondary | ICD-10-CM

## 2016-04-30 NOTE — Progress Notes (Signed)
Daily Session Note  Patient Details  Name: Jose Mckinney MRN: [id number] Date of Birth: 10-Dec-1946 Referring Provider:    Encounter Date: 04/30/2016  Check In:     Session Check In - 04/30/16 1237    Check-In   [facility]   Staff Present Su Hilt, MS, ACSM RCEP, Exercise Physiologist;Portia Rollene Rotunda, RN, BSN;Lisa Ysidro Evert, Felipe Drone, RN, [facility]   Supervising physician immediately available to respond to emergencies [facility] immediately available   Physician(s) Dr. Marily Memos   Medication changes reported     No   Fall or balance concerns reported    No   Warm-up and Cool-down Performed as group-led instruction   Resistance Training Performed Yes   VAD Patient? No   Pain Assessment   Currently in Pain? No/denies   Multiple Pain Sites No      Capillary Blood Glucose: No results found for this or any previous visit (from the past 24 hour(s)).      Exercise Prescription Changes - 04/30/16 1200    Response to Exercise   Blood Pressure (Admit) 102/58 mmHg   Blood Pressure (Exercise) 100/60 mmHg   Blood Pressure (Exit) 124/70 mmHg   Heart Rate (Admit) 59 bpm   Heart Rate (Exercise) 89 bpm   Heart Rate (Exit) 62 bpm   Oxygen Saturation (Admit) 97 %   Oxygen Saturation (Exercise) 89 %   Oxygen Saturation (Exit) 97 %   Rating of Perceived Exertion (Exercise) 9   Perceived Dyspnea (Exercise) 2   Duration Progress to 45 minutes of aerobic exercise without signs/symptoms of physical distress   Intensity THRR unchanged   Progression   Progression Continue to progress workloads to maintain intensity without signs/symptoms of physical distress.   Resistance Training   Training Prescription Yes   Weight orange bands   Interval Training   Interval Training No   Oxygen   Oxygen Continuous   Liters 2   Bike   Level 0.8   Minutes 15   NuStep   Level 5   Minutes 15   METs 2.6   Track   Laps 14   Minutes 15     Goals Met:   Independence with exercise equipment Exercise tolerated well Strength training completed today  Goals Unmet:  Not Applicable  Comments: Service time is from 1030 to 1210    Dr. Rush Farmer is Medical Director for Pulmonary Rehab at [facility].

## 2016-05-02 ENCOUNTER — Encounter (HOSPITAL_COMMUNITY)
Admission: RE | Admit: 2016-05-02 | Discharge: 2016-05-02 | Disposition: A | Discharge status: Home / Self Care | Payer: Medicare Other | Source: Ambulatory Visit | Attending: Pulmonary Disease | Admitting: Pulmonary Disease

## 2016-05-02 VITALS — Wt 125.4 lb

## 2016-05-02 DIAGNOSIS — J439 Emphysema, unspecified: Secondary | ICD-10-CM | POA: Diagnosis not present

## 2016-05-02 DIAGNOSIS — J438 Other emphysema: Secondary | ICD-10-CM

## 2016-05-02 NOTE — Progress Notes (Signed)
Daily Session Note  Patient Details  Name: Jose Mckinney MRN: [id number] Date of Birth: [birth year] Referring Provider:    Encounter Date: 05/02/2016  Check In:     Session Check In - 05/02/16 1107    Check-In   [facility]   Staff Present Su Hilt, MS, ACSM RCEP, Exercise Physiologist;Joan Leonia Reeves, RN, Luisa Hart, RN, BSN   Supervising physician immediately available to respond to emergencies [facility] immediately available   Physician(s) Dr. Marily Memos   Medication changes reported     No   Fall or balance concerns reported    No   Warm-up and Cool-down Performed as group-led instruction   Resistance Training Performed Yes   VAD Patient? No   Pain Assessment   Currently in Pain? No/denies   Multiple Pain Sites No      Capillary Blood Glucose: No results found for this or any previous visit (from the past 24 hour(s)).      Exercise Prescription Changes - 05/02/16 1400    Response to Exercise   Blood Pressure (Admit) 98/54 mmHg   Blood Pressure (Exercise) 112/60 mmHg   Blood Pressure (Exit) 116/60 mmHg   Heart Rate (Admit) 59 bpm   Heart Rate (Exercise) 76 bpm   Heart Rate (Exit) 66 bpm   Oxygen Saturation (Admit) 99 %   Oxygen Saturation (Exercise) 91 %   Oxygen Saturation (Exit) 97 %   Rating of Perceived Exertion (Exercise) 9   Perceived Dyspnea (Exercise) 2   Duration Progress to 45 minutes of aerobic exercise without signs/symptoms of physical distress   Intensity THRR unchanged   Progression   Progression Continue to progress workloads to maintain intensity without signs/symptoms of physical distress.   Resistance Training   Training Prescription Yes   Weight orange bands   Reps 10-12   Interval Training   Interval Training No   Oxygen   Oxygen Continuous   Liters 2   NuStep   Level 5   Minutes 15   METs 2.8   Track   Laps 10   Minutes 15     Goals Met:  Exercise tolerated well No report of cardiac  concerns or symptoms Strength training completed today  Goals Unmet:  Not Applicable  Comments: Service time is from 10:30am to 12:35pm    Dr. Rush Farmer is Medical Director for Pulmonary Rehab at [facility].

## 2016-05-07 ENCOUNTER — Encounter (HOSPITAL_COMMUNITY)
Admission: RE | Admit: 2016-05-07 | Discharge: 2016-05-07 | Disposition: A | Discharge status: Home / Self Care | Payer: Medicare Other | Source: Ambulatory Visit | Attending: Pulmonary Disease | Admitting: Pulmonary Disease

## 2016-05-07 DIAGNOSIS — J439 Emphysema, unspecified: Secondary | ICD-10-CM | POA: Diagnosis not present

## 2016-05-07 DIAGNOSIS — J438 Other emphysema: Secondary | ICD-10-CM

## 2016-05-09 ENCOUNTER — Encounter (HOSPITAL_COMMUNITY): Admission: RE | Admit: 2016-05-09 | Payer: Medicare Other | Source: Ambulatory Visit

## 2016-05-14 ENCOUNTER — Telehealth: Payer: Self-pay | Admitting: Pulmonary Disease

## 2016-05-14 ENCOUNTER — Encounter (HOSPITAL_COMMUNITY): Payer: Medicare Other

## 2016-05-14 ENCOUNTER — Encounter (HOSPITAL_COMMUNITY)
Admission: RE | Admit: 2016-05-14 | Discharge: 2016-05-14 | Disposition: A | Discharge status: Home / Self Care | Payer: Medicare Other | Source: Ambulatory Visit | Attending: Pulmonary Disease | Admitting: Pulmonary Disease

## 2016-05-14 DIAGNOSIS — J438 Other emphysema: Secondary | ICD-10-CM

## 2016-05-14 DIAGNOSIS — J439 Emphysema, unspecified: Secondary | ICD-10-CM

## 2016-05-14 NOTE — Telephone Encounter (Signed)
Spoke with [name] @ [facility] Rehab. She states that she sent order request to [facility] last week for him to sign, he has not signed. She needs order signed today please.   Routing to [employer] for follow up and to have VS sign order today.

## 2016-05-14 NOTE — Progress Notes (Signed)
Jose Mckinney started Outpt. Pulmonary Maintenance Program today. He tolerated exercise well without c/o and is excited about continuing his exercise.

## 2016-05-16 ENCOUNTER — Encounter (HOSPITAL_COMMUNITY)
Admission: RE | Admit: 2016-05-16 | Discharge: 2016-05-16 | Disposition: A | Discharge status: Home / Self Care | Payer: Medicare Other | Source: Ambulatory Visit | Attending: Pulmonary Disease | Admitting: Pulmonary Disease

## 2016-05-16 ENCOUNTER — Encounter (HOSPITAL_COMMUNITY): Payer: Medicare Other

## 2016-05-16 DIAGNOSIS — J439 Emphysema, unspecified: Secondary | ICD-10-CM

## 2016-05-16 NOTE — Progress Notes (Signed)
Pulmonary rehab maintenance note: Ronalee Belts presented to his bi-weekly pulmonary rehab exercise session complaining of dizziness that resolves with sitting that has previously been intermittent, but today seems to be more constant with movement. Initial BP while sitting 86/48. Orthostatics taken. Lying Sats on 2L 100%, HR 49, BP 104/54, Sitting Sats 100 on 2L, HR 52, BP 99/55, Standing Sats 100 on 2L, HR 58, BP 75/57, recovered to 92/56 with sitting. Medications reviewed. Patient also states he drinks 12 oz soda, 36 oz H2O and 26 oz beer on a daily basis. Educated patient on fluid loss with alcohol intake, changing positions slowly, and warning s/s of hypotension. MD office notified and appt made with MD for possible medication adjustments on Thursday 6/1 at 1030. Awaiting call back from MD to discuss symptoms. Patient not allowed to exercise and discharged in stable condition. Requested not to go to ED. Encouraged to call MD or seek medical care if any emergency symptoms occur. Will follow up with pt once I have spoken with MD office.

## 2016-05-16 NOTE — Telephone Encounter (Signed)
Ashtyn please advise if this has been signed. Thanks.

## 2016-05-17 NOTE — Telephone Encounter (Signed)
I have looked through everything that [facility] has given me to fax out and have looked on his desk at things needing to be signed and I have not seen any forms from [facility] for this patient.

## 2016-05-17 NOTE — Telephone Encounter (Signed)
Spoke with [facility]m Rehab, [name] is out of office on Fridays. They will look for forms and refax to Jose Mckinney's attention. She said if they can't find them it may have to wait until next week when [name] is back.  Routing to [employer] for follow up.

## 2016-05-21 ENCOUNTER — Encounter (HOSPITAL_COMMUNITY): Payer: Medicare Other

## 2016-05-21 ENCOUNTER — Encounter (HOSPITAL_COMMUNITY)
Admission: RE | Admit: 2016-05-21 | Discharge: 2016-05-21 | Disposition: A | Discharge status: Home / Self Care | Payer: Medicare Other | Source: Ambulatory Visit | Attending: Pulmonary Disease | Admitting: Pulmonary Disease

## 2016-05-21 NOTE — Telephone Encounter (Signed)
Will need to contact [facility] to follow up as they were looking for these forms Friday afternoon.  Per message below they were having to look for the forms because [name] was out of town.

## 2016-05-21 NOTE — Telephone Encounter (Signed)
Jose Mckinney, have you received any forms for this patient? Please advise.

## 2016-05-21 NOTE — Telephone Encounter (Signed)
Spoke with Jose Mckinney at [facility] - could not find the forms and they will have Jose Mckinney address this when she comes in tomorrow (Wed.).  Jose Mckinney only works on Tuesday, Wednesday and Thursday's.  Will await call back

## 2016-05-22 NOTE — Telephone Encounter (Signed)
Spoke with Jose Mckinney at [facility]monary [facility]hab. Jose Mckinney is not in today. Jose Mckinney tried to locate these forms but she could not find them. Will await Jose Mckinney's call back once she returns to the office.

## 2016-05-23 ENCOUNTER — Encounter (HOSPITAL_COMMUNITY)
Admission: RE | Admit: 2016-05-23 | Discharge: 2016-05-23 | Disposition: A | Discharge status: Home / Self Care | Payer: Medicare Other | Source: Ambulatory Visit | Attending: Pulmonary Disease | Admitting: Pulmonary Disease

## 2016-05-23 ENCOUNTER — Encounter (HOSPITAL_COMMUNITY): Payer: Medicare Other

## 2016-05-23 DIAGNOSIS — J439 Emphysema, unspecified: Secondary | ICD-10-CM | POA: Insufficient documentation

## 2016-05-23 NOTE — Telephone Encounter (Signed)
Spoke with Jose Mckinney, states that she spoke with Jose Mckinney and they are just needing the order placed for Pulm Rehab Maintenance Program. Pt has graduated from [employer] and is ready for the next program. Order placed for [employer]. Nothing further needed.

## 2016-05-23 NOTE — Progress Notes (Signed)
Discharge Summary  Patient Details  Name: Jose Mckinney MRN: [id number] Date of Birth: Sep 08, 1946 Referring Provider:     Number of Visits: 23  Reason for Discharge:  Patient reached a stable level of exercise. Patient independent in their exercise.  Smoking History:  History  Smoking status  . Former Smoker -- 2.50 packs/day for 50 years  . Types: Cigarettes  . Quit date: 07/10/2014  Smokeless tobacco  . Not on file    Comment: Smoked 2.5-3 PPD at highest amount.     Diagnosis:  No diagnosis found.  ADL UCSD:     Pulmonary Assessment Scores      05/23/16 0848       ADL UCSD   ADL Phase Entry     SOB Score total 40        Initial Exercise Prescription:   Discharge Exercise Prescription (Final Exercise Prescription Changes):     Exercise Prescription Changes - 05/02/16 1400    Response to Exercise   Blood Pressure (Admit) 98/54 mmHg   Blood Pressure (Exercise) 112/60 mmHg   Blood Pressure (Exit) 116/60 mmHg   Heart Rate (Admit) 59 bpm   Heart Rate (Exercise) 76 bpm   Heart Rate (Exit) 66 bpm   Oxygen Saturation (Admit) 99 %   Oxygen Saturation (Exercise) 91 %   Oxygen Saturation (Exit) 97 %   Rating of Perceived Exertion (Exercise) 9   Perceived Dyspnea (Exercise) 2   Duration Progress to 45 minutes of aerobic exercise without signs/symptoms of physical distress   Intensity THRR unchanged   Progression   Progression Continue to progress workloads to maintain intensity without signs/symptoms of physical distress.   Resistance Training   Training Prescription Yes   Weight orange bands   Reps 10-12   Interval Training   Interval Training No   Oxygen   Oxygen Continuous   Liters 2   NuStep   Level 5   Minutes 15   METs 2.8   Track   Laps 10   Minutes 15      Functional Capacity:     6 Minute Walk      05/07/16 1702       6 Minute Walk   Phase Discharge     Distance 1600 feet     Walk Time 6 minutes     # of Rest Breaks 0     MPH  3.03     METS 3.77     RPE 12     Perceived Dyspnea  2     VO2 Peak 13.22     Symptoms No     Resting HR 52 bpm     Resting BP 84/50 mmHg     Max Ex. HR 65 bpm     Max Ex. BP 144/65 mmHg     2 Minute Post BP 114/71 mmHg     Interval HR   Interval Heart Rate? --  Patient's fingers were cold and the pulse oximeter was not reading     Interval Oxygen   Interval Oxygen? --  Patient's fingers were cold and the pulse oximeter was not reading         Psychological, QOL, Others - Outcomes: PHQ 2/9: Depression screen [facility] 2/9 05/07/2016 02/05/2016  Decreased Interest 1 0  Down, Depressed, Hopeless 1 1  PHQ - 2 Score 2 1  Altered sleeping 3 -  Tired, decreased energy 1 -  Change in appetite 0 -  Feeling bad or failure about yourself  2 -  Trouble concentrating 0 -  Moving slowly or fidgety/restless 1 -  Suicidal thoughts 0 -  PHQ-9 Score 9 -  Difficult doing work/chores Somewhat difficult -    Quality of Life:     Quality of Life - 05/23/16 0848    Quality of Life Scores   GLOBAL Pre 19.12 %   GLOBAL Post 20.84 %   GLOBAL % Change 9 %      Personal Goals: Goals established at orientation with interventions provided to work toward goal.    Personal Goals Discharge: See paper ITP. All personal goals met. Pt increased his stamina and strength as evidenced by his ability to walk and additional 930 ft on his discharge 6 min walk test as compared to his admission. Patient plans to continue his post discharge exercise by enrolling in the pulmonary rehab maintenance program.   Nutrition & Weight - Outcomes:    Nutrition:   Nutrition Discharge:   Education Questionnaire Score:     Knowledge Questionnaire Score - 05/23/16 0847    Knowledge Questionnaire Score   Pre Score 11/13   Post Score 10/13      Goals reviewed with patient

## 2016-05-23 NOTE — Telephone Encounter (Signed)
lmtcb X1 for Jose Mckinney at [employer] with Thayer Headings- states she will be in later today but has not yet arrived.  Will await call back.

## 2016-05-28 ENCOUNTER — Encounter (HOSPITAL_COMMUNITY): Payer: Medicare Other

## 2016-05-28 ENCOUNTER — Encounter (HOSPITAL_COMMUNITY)
Admission: RE | Admit: 2016-05-28 | Discharge: 2016-05-28 | Disposition: A | Discharge status: Home / Self Care | Payer: Self-pay | Source: Ambulatory Visit | Attending: Pulmonary Disease | Admitting: Pulmonary Disease

## 2016-05-30 ENCOUNTER — Encounter (HOSPITAL_COMMUNITY)
Admission: RE | Admit: 2016-05-30 | Discharge: 2016-05-30 | Disposition: A | Discharge status: Home / Self Care | Payer: Self-pay | Source: Ambulatory Visit | Attending: Pulmonary Disease | Admitting: Pulmonary Disease

## 2016-05-30 ENCOUNTER — Encounter (HOSPITAL_COMMUNITY): Payer: Medicare Other

## 2016-06-04 ENCOUNTER — Encounter (HOSPITAL_COMMUNITY)
Admission: RE | Admit: 2016-06-04 | Discharge: 2016-06-04 | Disposition: A | Discharge status: Home / Self Care | Payer: Self-pay | Source: Ambulatory Visit | Attending: Pulmonary Disease | Admitting: Pulmonary Disease

## 2016-06-04 ENCOUNTER — Encounter (HOSPITAL_COMMUNITY): Payer: Medicare Other

## 2016-06-06 ENCOUNTER — Encounter (HOSPITAL_COMMUNITY)
Admission: RE | Admit: 2016-06-06 | Discharge: 2016-06-06 | Disposition: A | Discharge status: Home / Self Care | Payer: Self-pay | Source: Ambulatory Visit | Attending: Pulmonary Disease | Admitting: Pulmonary Disease

## 2016-06-06 ENCOUNTER — Encounter (HOSPITAL_COMMUNITY): Payer: Medicare Other

## 2016-06-11 ENCOUNTER — Encounter (HOSPITAL_COMMUNITY)
Admission: RE | Admit: 2016-06-11 | Discharge: 2016-06-11 | Disposition: A | Discharge status: Home / Self Care | Payer: Self-pay | Source: Ambulatory Visit | Attending: Pulmonary Disease | Admitting: Pulmonary Disease

## 2016-06-13 ENCOUNTER — Encounter (HOSPITAL_COMMUNITY)
Admission: RE | Admit: 2016-06-13 | Discharge: 2016-06-13 | Disposition: A | Discharge status: Home / Self Care | Payer: Self-pay | Source: Ambulatory Visit | Attending: Pulmonary Disease | Admitting: Pulmonary Disease

## 2016-06-18 ENCOUNTER — Encounter (HOSPITAL_COMMUNITY)
Admission: RE | Admit: 2016-06-18 | Discharge: 2016-06-18 | Disposition: A | Discharge status: Home / Self Care | Payer: Self-pay | Source: Ambulatory Visit | Attending: Pulmonary Disease | Admitting: Pulmonary Disease

## 2016-06-20 ENCOUNTER — Encounter (HOSPITAL_COMMUNITY)
Admission: RE | Admit: 2016-06-20 | Discharge: 2016-06-20 | Disposition: A | Discharge status: Home / Self Care | Payer: Self-pay | Source: Ambulatory Visit | Attending: Pulmonary Disease | Admitting: Pulmonary Disease

## 2016-06-25 ENCOUNTER — Encounter (HOSPITAL_COMMUNITY): Payer: Medicare Other

## 2016-06-25 DIAGNOSIS — J439 Emphysema, unspecified: Secondary | ICD-10-CM | POA: Insufficient documentation

## 2016-06-27 ENCOUNTER — Encounter (HOSPITAL_COMMUNITY)
Admission: RE | Admit: 2016-06-27 | Discharge: 2016-06-27 | Disposition: A | Discharge status: Home / Self Care | Payer: Self-pay | Source: Ambulatory Visit | Attending: Pulmonary Disease | Admitting: Pulmonary Disease

## 2016-07-02 ENCOUNTER — Encounter (HOSPITAL_COMMUNITY): Payer: Medicare Other

## 2016-07-04 ENCOUNTER — Encounter (HOSPITAL_COMMUNITY)
Admission: RE | Admit: 2016-07-04 | Discharge: 2016-07-04 | Disposition: A | Discharge status: Home / Self Care | Payer: Self-pay | Source: Ambulatory Visit | Attending: Pulmonary Disease | Admitting: Pulmonary Disease

## 2016-07-09 ENCOUNTER — Encounter (HOSPITAL_COMMUNITY)
Admission: RE | Admit: 2016-07-09 | Discharge: 2016-07-09 | Disposition: A | Discharge status: Home / Self Care | Payer: Self-pay | Source: Ambulatory Visit | Attending: Pulmonary Disease | Admitting: Pulmonary Disease

## 2016-07-11 ENCOUNTER — Telehealth (HOSPITAL_COMMUNITY): Payer: Self-pay | Admitting: *Deleted

## 2016-07-11 ENCOUNTER — Encounter (HOSPITAL_COMMUNITY)
Admission: RE | Admit: 2016-07-11 | Discharge: 2016-07-11 | Disposition: A | Discharge status: Home / Self Care | Payer: Self-pay | Source: Ambulatory Visit | Attending: Pulmonary Disease | Admitting: Pulmonary Disease

## 2016-07-16 ENCOUNTER — Encounter (HOSPITAL_COMMUNITY)
Admission: RE | Admit: 2016-07-16 | Discharge: 2016-07-16 | Disposition: A | Discharge status: Home / Self Care | Payer: Self-pay | Source: Ambulatory Visit | Attending: Pulmonary Disease | Admitting: Pulmonary Disease

## 2016-07-18 ENCOUNTER — Encounter (HOSPITAL_COMMUNITY)
Admission: RE | Admit: 2016-07-18 | Discharge: 2016-07-18 | Disposition: A | Discharge status: Home / Self Care | Payer: Self-pay | Source: Ambulatory Visit | Attending: Pulmonary Disease | Admitting: Pulmonary Disease

## 2016-07-23 ENCOUNTER — Encounter (HOSPITAL_COMMUNITY)
Admission: RE | Admit: 2016-07-23 | Discharge: 2016-07-23 | Disposition: A | Discharge status: Home / Self Care | Payer: Self-pay | Source: Ambulatory Visit | Attending: Pulmonary Disease | Admitting: Pulmonary Disease

## 2016-07-23 DIAGNOSIS — J439 Emphysema, unspecified: Secondary | ICD-10-CM | POA: Insufficient documentation

## 2016-07-25 ENCOUNTER — Encounter (HOSPITAL_COMMUNITY)
Admission: RE | Admit: 2016-07-25 | Discharge: 2016-07-25 | Disposition: A | Discharge status: Home / Self Care | Payer: Self-pay | Source: Ambulatory Visit | Attending: Pulmonary Disease | Admitting: Pulmonary Disease

## 2016-07-30 ENCOUNTER — Encounter (HOSPITAL_COMMUNITY)
Admission: RE | Admit: 2016-07-30 | Discharge: 2016-07-30 | Disposition: A | Discharge status: Home / Self Care | Payer: Self-pay | Source: Ambulatory Visit | Attending: Pulmonary Disease | Admitting: Pulmonary Disease

## 2016-08-01 ENCOUNTER — Encounter (HOSPITAL_COMMUNITY)
Admission: RE | Admit: 2016-08-01 | Discharge: 2016-08-01 | Disposition: A | Discharge status: Home / Self Care | Payer: Self-pay | Source: Ambulatory Visit | Attending: Pulmonary Disease | Admitting: Pulmonary Disease

## 2016-08-06 ENCOUNTER — Encounter (HOSPITAL_COMMUNITY)
Admission: RE | Admit: 2016-08-06 | Discharge: 2016-08-06 | Disposition: A | Discharge status: Home / Self Care | Payer: Self-pay | Source: Ambulatory Visit | Attending: Pulmonary Disease | Admitting: Pulmonary Disease

## 2016-08-08 ENCOUNTER — Encounter (HOSPITAL_COMMUNITY)
Admission: RE | Admit: 2016-08-08 | Discharge: 2016-08-08 | Disposition: A | Discharge status: Home / Self Care | Payer: Self-pay | Source: Ambulatory Visit | Attending: Pulmonary Disease | Admitting: Pulmonary Disease

## 2016-08-13 ENCOUNTER — Encounter (HOSPITAL_COMMUNITY)
Admission: RE | Admit: 2016-08-13 | Discharge: 2016-08-13 | Disposition: A | Discharge status: Home / Self Care | Payer: Self-pay | Source: Ambulatory Visit | Attending: Pulmonary Disease | Admitting: Pulmonary Disease

## 2016-08-15 ENCOUNTER — Encounter (HOSPITAL_COMMUNITY)
Admission: RE | Admit: 2016-08-15 | Discharge: 2016-08-15 | Disposition: A | Discharge status: Home / Self Care | Payer: Self-pay | Source: Ambulatory Visit | Attending: Pulmonary Disease | Admitting: Pulmonary Disease

## 2016-08-20 ENCOUNTER — Encounter (HOSPITAL_COMMUNITY)
Admission: RE | Admit: 2016-08-20 | Discharge: 2016-08-20 | Disposition: A | Discharge status: Home / Self Care | Payer: Self-pay | Source: Ambulatory Visit | Attending: Pulmonary Disease | Admitting: Pulmonary Disease

## 2016-08-22 ENCOUNTER — Encounter (HOSPITAL_COMMUNITY)
Admission: RE | Admit: 2016-08-22 | Discharge: 2016-08-22 | Disposition: A | Discharge status: Home / Self Care | Payer: Self-pay | Source: Ambulatory Visit | Attending: Pulmonary Disease | Admitting: Pulmonary Disease

## 2016-08-27 ENCOUNTER — Encounter (HOSPITAL_COMMUNITY)
Admission: RE | Admit: 2016-08-27 | Discharge: 2016-08-27 | Disposition: A | Discharge status: Home / Self Care | Payer: Self-pay | Source: Ambulatory Visit | Attending: Pulmonary Disease | Admitting: Pulmonary Disease

## 2016-08-27 DIAGNOSIS — J439 Emphysema, unspecified: Secondary | ICD-10-CM | POA: Insufficient documentation

## 2016-08-29 ENCOUNTER — Encounter (HOSPITAL_COMMUNITY): Payer: Medicare Other

## 2016-09-03 ENCOUNTER — Encounter (HOSPITAL_COMMUNITY): Payer: Medicare Other

## 2016-09-05 ENCOUNTER — Encounter (HOSPITAL_COMMUNITY)
Admission: RE | Admit: 2016-09-05 | Discharge: 2016-09-05 | Disposition: A | Discharge status: Home / Self Care | Payer: Self-pay | Source: Ambulatory Visit | Attending: Pulmonary Disease | Admitting: Pulmonary Disease

## 2016-09-10 ENCOUNTER — Encounter (HOSPITAL_COMMUNITY)
Admission: RE | Admit: 2016-09-10 | Discharge: 2016-09-10 | Disposition: A | Discharge status: Home / Self Care | Payer: Self-pay | Source: Ambulatory Visit | Attending: Pulmonary Disease | Admitting: Pulmonary Disease

## 2016-09-12 ENCOUNTER — Encounter (HOSPITAL_COMMUNITY)
Admission: RE | Admit: 2016-09-12 | Discharge: 2016-09-12 | Disposition: A | Discharge status: Home / Self Care | Payer: Self-pay | Source: Ambulatory Visit | Attending: Pulmonary Disease | Admitting: Pulmonary Disease

## 2016-09-17 ENCOUNTER — Encounter (HOSPITAL_COMMUNITY)
Admission: RE | Admit: 2016-09-17 | Discharge: 2016-09-17 | Disposition: A | Discharge status: Home / Self Care | Payer: Self-pay | Source: Ambulatory Visit | Attending: Pulmonary Disease | Admitting: Pulmonary Disease

## 2016-09-19 ENCOUNTER — Encounter (HOSPITAL_COMMUNITY)
Admission: RE | Admit: 2016-09-19 | Discharge: 2016-09-19 | Disposition: A | Discharge status: Home / Self Care | Payer: Self-pay | Source: Ambulatory Visit | Attending: Pulmonary Disease | Admitting: Pulmonary Disease

## 2016-09-24 ENCOUNTER — Encounter (HOSPITAL_COMMUNITY): Payer: Medicare Other

## 2016-09-24 DIAGNOSIS — J439 Emphysema, unspecified: Secondary | ICD-10-CM | POA: Insufficient documentation

## 2016-09-26 ENCOUNTER — Encounter (HOSPITAL_COMMUNITY)
Admission: RE | Admit: 2016-09-26 | Discharge: 2016-09-26 | Disposition: A | Discharge status: Home / Self Care | Payer: Self-pay | Source: Ambulatory Visit | Attending: Pulmonary Disease | Admitting: Pulmonary Disease

## 2016-10-01 ENCOUNTER — Encounter (HOSPITAL_COMMUNITY): Payer: Medicare Other

## 2016-10-03 ENCOUNTER — Encounter (HOSPITAL_COMMUNITY)
Admission: RE | Admit: 2016-10-03 | Discharge: 2016-10-03 | Disposition: A | Discharge status: Home / Self Care | Payer: Self-pay | Source: Ambulatory Visit | Attending: Pulmonary Disease | Admitting: Pulmonary Disease

## 2016-10-08 ENCOUNTER — Encounter (HOSPITAL_COMMUNITY)
Admission: RE | Admit: 2016-10-08 | Discharge: 2016-10-08 | Disposition: A | Discharge status: Home / Self Care | Payer: Self-pay | Source: Ambulatory Visit | Attending: Pulmonary Disease | Admitting: Pulmonary Disease

## 2016-10-10 ENCOUNTER — Encounter (HOSPITAL_COMMUNITY)
Admission: RE | Admit: 2016-10-10 | Discharge: 2016-10-10 | Disposition: A | Discharge status: Home / Self Care | Payer: Self-pay | Source: Ambulatory Visit | Attending: Pulmonary Disease | Admitting: Pulmonary Disease

## 2016-10-15 ENCOUNTER — Encounter (HOSPITAL_COMMUNITY)
Admission: RE | Admit: 2016-10-15 | Discharge: 2016-10-15 | Disposition: A | Discharge status: Home / Self Care | Payer: Self-pay | Source: Ambulatory Visit | Attending: Pulmonary Disease | Admitting: Pulmonary Disease

## 2016-10-17 ENCOUNTER — Encounter (HOSPITAL_COMMUNITY)
Admission: RE | Admit: 2016-10-17 | Discharge: 2016-10-17 | Disposition: A | Discharge status: Home / Self Care | Payer: Self-pay | Source: Ambulatory Visit | Attending: Pulmonary Disease | Admitting: Pulmonary Disease

## 2016-10-22 ENCOUNTER — Encounter (HOSPITAL_COMMUNITY): Payer: Medicare Other

## 2016-10-24 ENCOUNTER — Encounter (HOSPITAL_COMMUNITY): Payer: Medicare Other

## 2016-10-24 DIAGNOSIS — J439 Emphysema, unspecified: Secondary | ICD-10-CM | POA: Insufficient documentation

## 2016-10-29 ENCOUNTER — Encounter (HOSPITAL_COMMUNITY)
Admission: RE | Admit: 2016-10-29 | Discharge: 2016-10-29 | Disposition: A | Discharge status: Home / Self Care | Payer: Self-pay | Source: Ambulatory Visit | Attending: Pulmonary Disease | Admitting: Pulmonary Disease

## 2016-10-31 ENCOUNTER — Encounter (HOSPITAL_COMMUNITY)
Admission: RE | Admit: 2016-10-31 | Discharge: 2016-10-31 | Disposition: A | Discharge status: Home / Self Care | Payer: Self-pay | Source: Ambulatory Visit | Attending: Pulmonary Disease | Admitting: Pulmonary Disease

## 2016-11-05 ENCOUNTER — Encounter (HOSPITAL_COMMUNITY)
Admission: RE | Admit: 2016-11-05 | Discharge: 2016-11-05 | Disposition: A | Discharge status: Home / Self Care | Payer: Self-pay | Source: Ambulatory Visit | Attending: Pulmonary Disease | Admitting: Pulmonary Disease

## 2016-11-07 ENCOUNTER — Encounter (HOSPITAL_COMMUNITY)
Admission: RE | Admit: 2016-11-07 | Discharge: 2016-11-07 | Disposition: A | Discharge status: Home / Self Care | Payer: Self-pay | Source: Ambulatory Visit | Attending: Pulmonary Disease | Admitting: Pulmonary Disease

## 2016-11-12 ENCOUNTER — Encounter (HOSPITAL_COMMUNITY)
Admission: RE | Admit: 2016-11-12 | Discharge: 2016-11-12 | Disposition: A | Discharge status: Home / Self Care | Payer: Self-pay | Source: Ambulatory Visit | Attending: Pulmonary Disease | Admitting: Pulmonary Disease

## 2016-11-14 ENCOUNTER — Encounter (HOSPITAL_COMMUNITY): Payer: Medicare Other

## 2016-11-19 ENCOUNTER — Encounter (HOSPITAL_COMMUNITY)
Admission: RE | Admit: 2016-11-19 | Discharge: 2016-11-19 | Disposition: A | Discharge status: Home / Self Care | Payer: Self-pay | Source: Ambulatory Visit | Attending: Pulmonary Disease | Admitting: Pulmonary Disease

## 2016-11-21 ENCOUNTER — Encounter (HOSPITAL_COMMUNITY)
Admission: RE | Admit: 2016-11-21 | Discharge: 2016-11-21 | Disposition: A | Discharge status: Home / Self Care | Payer: Self-pay | Source: Ambulatory Visit | Attending: Pulmonary Disease | Admitting: Pulmonary Disease

## 2016-11-26 ENCOUNTER — Telehealth: Payer: Self-pay | Admitting: Pulmonary Disease

## 2016-11-26 ENCOUNTER — Encounter (HOSPITAL_COMMUNITY)
Admission: RE | Admit: 2016-11-26 | Discharge: 2016-11-26 | Disposition: A | Discharge status: Home / Self Care | Payer: Self-pay | Source: Ambulatory Visit | Attending: Pulmonary Disease | Admitting: Pulmonary Disease

## 2016-11-26 DIAGNOSIS — J439 Emphysema, unspecified: Secondary | ICD-10-CM | POA: Insufficient documentation

## 2016-11-26 NOTE — Telephone Encounter (Signed)
PA request for revatio received from [place]- called insurance at [phone] to initiate, this rx has been approved through 12/22/2017.  Reference #- [id number].   Called pharmacy to make aware of approval. Nothing further needed.

## 2016-11-28 ENCOUNTER — Encounter (HOSPITAL_COMMUNITY)
Admission: RE | Admit: 2016-11-28 | Discharge: 2016-11-28 | Disposition: A | Discharge status: Home / Self Care | Payer: Self-pay | Source: Ambulatory Visit | Attending: Pulmonary Disease | Admitting: Pulmonary Disease

## 2016-12-03 ENCOUNTER — Encounter (HOSPITAL_COMMUNITY): Payer: Self-pay

## 2016-12-05 ENCOUNTER — Encounter (HOSPITAL_COMMUNITY)
Admission: RE | Admit: 2016-12-05 | Discharge: 2016-12-05 | Disposition: A | Discharge status: Home / Self Care | Payer: Self-pay | Source: Ambulatory Visit | Attending: Pulmonary Disease | Admitting: Pulmonary Disease

## 2016-12-10 ENCOUNTER — Encounter (HOSPITAL_COMMUNITY)
Admission: RE | Admit: 2016-12-10 | Discharge: 2016-12-10 | Disposition: A | Discharge status: Home / Self Care | Payer: Self-pay | Source: Ambulatory Visit | Attending: Pulmonary Disease | Admitting: Pulmonary Disease

## 2016-12-12 ENCOUNTER — Encounter (HOSPITAL_COMMUNITY)
Admission: RE | Admit: 2016-12-12 | Discharge: 2016-12-12 | Disposition: A | Discharge status: Home / Self Care | Payer: Self-pay | Source: Ambulatory Visit | Attending: Pulmonary Disease | Admitting: Pulmonary Disease

## 2016-12-17 ENCOUNTER — Encounter (HOSPITAL_COMMUNITY): Payer: Self-pay

## 2016-12-19 ENCOUNTER — Encounter (HOSPITAL_COMMUNITY)
Admission: RE | Admit: 2016-12-19 | Discharge: 2016-12-19 | Disposition: A | Discharge status: Home / Self Care | Payer: Self-pay | Source: Ambulatory Visit | Attending: Pulmonary Disease | Admitting: Pulmonary Disease

## 2016-12-24 ENCOUNTER — Encounter (HOSPITAL_COMMUNITY)
Admission: RE | Admit: 2016-12-24 | Discharge: 2016-12-24 | Disposition: A | Discharge status: Home / Self Care | Payer: Self-pay | Source: Ambulatory Visit | Attending: Pulmonary Disease | Admitting: Pulmonary Disease

## 2016-12-24 DIAGNOSIS — J439 Emphysema, unspecified: Secondary | ICD-10-CM | POA: Insufficient documentation

## 2016-12-26 ENCOUNTER — Encounter (HOSPITAL_COMMUNITY)
Admission: RE | Admit: 2016-12-26 | Discharge: 2016-12-26 | Disposition: A | Discharge status: Home / Self Care | Payer: Self-pay | Source: Ambulatory Visit | Attending: Pulmonary Disease | Admitting: Pulmonary Disease

## 2016-12-31 ENCOUNTER — Telehealth (HOSPITAL_COMMUNITY): Payer: Self-pay | Admitting: Family Medicine

## 2016-12-31 ENCOUNTER — Encounter (HOSPITAL_COMMUNITY): Payer: Self-pay

## 2016-12-31 NOTE — Telephone Encounter (Signed)
Pt called to cancel due to breathing problems while doing extra things. I ask pt if he needed to s/n and he said no he was fine he just couldn't do the exercise part..... KJ

## 2017-01-02 ENCOUNTER — Encounter (HOSPITAL_COMMUNITY)
Admission: RE | Admit: 2017-01-02 | Discharge: 2017-01-02 | Disposition: A | Discharge status: Home / Self Care | Payer: Self-pay | Source: Ambulatory Visit | Attending: Pulmonary Disease | Admitting: Pulmonary Disease

## 2017-01-07 ENCOUNTER — Encounter (HOSPITAL_COMMUNITY)
Admission: RE | Admit: 2017-01-07 | Discharge: 2017-01-07 | Disposition: A | Discharge status: Home / Self Care | Payer: Self-pay | Source: Ambulatory Visit | Attending: Pulmonary Disease | Admitting: Pulmonary Disease

## 2017-01-09 ENCOUNTER — Encounter (HOSPITAL_COMMUNITY): Payer: Self-pay

## 2017-01-14 ENCOUNTER — Encounter (HOSPITAL_COMMUNITY): Payer: Self-pay

## 2017-01-15 NOTE — Progress Notes (Signed)
[facility] PREP Weekly Session   Patient Details  Name: Jose Mckinney MRN: [id number] Date of Birth: Dec 19, 1946 Age: 71 y.o. PCP: Stephens Shire, MD  Vitals:   01/15/17 1416  Weight: 122 lb 12.8 oz (55.7 kg)        [facility] Weekly seesion - 01/15/17 1400      Weekly Session   Topic Discussed Finding support   Minutes exercised this week 0 minutes   Classes attended to date 1       Vanita Ingles 01/15/2017, 2:17 PM

## 2017-01-16 ENCOUNTER — Encounter (HOSPITAL_COMMUNITY)
Admission: RE | Admit: 2017-01-16 | Discharge: 2017-01-16 | Disposition: A | Discharge status: Home / Self Care | Payer: Self-pay | Source: Ambulatory Visit | Attending: Pulmonary Disease | Admitting: Pulmonary Disease

## 2017-01-21 ENCOUNTER — Encounter (HOSPITAL_COMMUNITY)
Admission: RE | Admit: 2017-01-21 | Discharge: 2017-01-21 | Disposition: A | Discharge status: Home / Self Care | Payer: Self-pay | Source: Ambulatory Visit | Attending: Pulmonary Disease | Admitting: Pulmonary Disease

## 2017-01-22 NOTE — Progress Notes (Signed)
[facility] PREP Weekly Session   Patient Details  Name: Manveer Gomes MRN: [id number] Date of Birth: [birth year] Age: 71 y.o. PCP: Stephens Shire, MD  Vitals:   01/22/17 1326  Weight: 122 lb 6.4 oz (55.5 kg)        [facility] [employer] Weekly seesion - 01/22/17 1300      Weekly Session   Topic Discussed Calorie breakdown   Minutes exercised this week 60 minutes  30 cardio/30 strength   Classes attended to date 2   Comments fun things done: "exercise on equipment" barriers: "mixing my beer & water"       Vanita Ingles 01/22/2017, 1:27 PM

## 2017-01-23 ENCOUNTER — Encounter (HOSPITAL_COMMUNITY)
Admission: RE | Admit: 2017-01-23 | Discharge: 2017-01-23 | Disposition: A | Discharge status: Home / Self Care | Payer: Self-pay | Source: Ambulatory Visit | Attending: Pulmonary Disease | Admitting: Pulmonary Disease

## 2017-01-23 DIAGNOSIS — J439 Emphysema, unspecified: Secondary | ICD-10-CM | POA: Insufficient documentation

## 2017-01-28 ENCOUNTER — Encounter (HOSPITAL_COMMUNITY)
Admission: RE | Admit: 2017-01-28 | Discharge: 2017-01-28 | Disposition: A | Discharge status: Home / Self Care | Payer: Self-pay | Source: Ambulatory Visit | Attending: Pulmonary Disease | Admitting: Pulmonary Disease

## 2017-01-28 ENCOUNTER — Encounter (INDEPENDENT_AMBULATORY_CARE_PROVIDER_SITE_OTHER): Payer: Self-pay

## 2017-01-30 ENCOUNTER — Encounter (HOSPITAL_COMMUNITY)
Admission: RE | Admit: 2017-01-30 | Discharge: 2017-01-30 | Disposition: A | Discharge status: Home / Self Care | Payer: Self-pay | Source: Ambulatory Visit | Attending: Pulmonary Disease | Admitting: Pulmonary Disease

## 2017-01-31 NOTE — Progress Notes (Signed)
[facility] [employer] PREP Weekly Session   Patient Details  Name: Jose Mckinney MRN: [id number] Date of Birth: 07/01/1946 Age: 71 y.o. PCP: Stephens Shire, MD  Vitals:   01/31/17 1234  Weight: 124 lb (56.2 kg)        [facility] [employer] Weekly seesion - 01/31/17 1200      Weekly Session   Topic Discussed Hitting roadblocks   Minutes exercised this week 50 minutes  10cardio/40strength   Classes attended to date 3     Fun things done since last meeting:"Gained weight" Things grateful for:"No soreness after 3workouts" Nutrition celebrations:"weight gain of 1.2lbs" Barriers/Struggles: "to keep awake in daylight"    Vanita Ingles 01/31/2017, 12:35 PM

## 2017-02-04 ENCOUNTER — Encounter (HOSPITAL_COMMUNITY)
Admission: RE | Admit: 2017-02-04 | Discharge: 2017-02-04 | Disposition: A | Discharge status: Home / Self Care | Payer: Self-pay | Source: Ambulatory Visit | Attending: Pulmonary Disease | Admitting: Pulmonary Disease

## 2017-02-06 ENCOUNTER — Encounter (HOSPITAL_COMMUNITY)
Admission: RE | Admit: 2017-02-06 | Discharge: 2017-02-06 | Disposition: A | Discharge status: Home / Self Care | Payer: Self-pay | Source: Ambulatory Visit | Attending: Pulmonary Disease | Admitting: Pulmonary Disease

## 2017-02-07 NOTE — Progress Notes (Signed)
[facility] [employer] PREP Weekly Session   Patient Details  Name: Maninder Deboer MRN: [id number] Date of Birth: 1946/10/28 Age: 71 y.o. PCP: Stephens Shire, MD  Vitals:   02/07/17 1131  Weight: 123 lb (55.8 kg)        [facility] [employer] Weekly seesion - 02/07/17 1100      Weekly Session   Topic Discussed Other  guest speaker   Minutes exercised this week 60 minutes  30 cardio/30 weights   Classes attended to date 4     Fun things you did since last meeting:"lower body workouts" Things you are grateful for:"listening to Al"  Vanita Ingles 02/07/2017, 11:32 AM

## 2017-02-11 ENCOUNTER — Encounter (HOSPITAL_COMMUNITY)
Admission: RE | Admit: 2017-02-11 | Discharge: 2017-02-11 | Disposition: A | Discharge status: Home / Self Care | Payer: Self-pay | Source: Ambulatory Visit | Attending: Pulmonary Disease | Admitting: Pulmonary Disease

## 2017-02-13 ENCOUNTER — Encounter (HOSPITAL_COMMUNITY)
Admission: RE | Admit: 2017-02-13 | Discharge: 2017-02-13 | Disposition: A | Discharge status: Home / Self Care | Payer: Self-pay | Source: Ambulatory Visit | Attending: Pulmonary Disease | Admitting: Pulmonary Disease

## 2017-02-14 NOTE — Progress Notes (Signed)
[facility] [employer] PREP Weekly Session   Patient Details  Name: Garret Teale MRN: [id number] Date of Birth: 1946/12/12 Age: 71 y.o. PCP: Stephens Shire, MD  Vitals:   02/14/17 0957  Weight: 122 lb 12.8 oz (55.7 kg)        [facility] [employer] Weekly sees[facility] - 02/14/17 0900      Weekly Session   Topic Discussed Other ways to be active   Minutes exercised this week 120 minutes  60cardio/60 strenght   Classes attended to date 5     Fun things you did since last meeting:"played billiards" Things you are grateful for:"gym workouts going well" Barriers:"weather"  Vanita Ingles 02/14/2017, 9:57 AM

## 2017-02-18 ENCOUNTER — Encounter (HOSPITAL_COMMUNITY)
Admission: RE | Admit: 2017-02-18 | Discharge: 2017-02-18 | Disposition: A | Discharge status: Home / Self Care | Payer: Self-pay | Source: Ambulatory Visit | Attending: Pulmonary Disease | Admitting: Pulmonary Disease

## 2017-02-20 ENCOUNTER — Encounter (HOSPITAL_COMMUNITY)
Admission: RE | Admit: 2017-02-20 | Discharge: 2017-02-20 | Disposition: A | Discharge status: Home / Self Care | Payer: Medicare Other | Source: Ambulatory Visit | Attending: Pulmonary Disease | Admitting: Pulmonary Disease

## 2017-02-20 DIAGNOSIS — J439 Emphysema, unspecified: Secondary | ICD-10-CM | POA: Insufficient documentation

## 2017-02-21 NOTE — Progress Notes (Signed)
[facility] [employer] PRE[facility] Weekly Session   Patient Details  Name: Burleigh Brockmann MRN: [id number] Date of Birth: Aug 17, 1946 Age: 70 y.o. PCP: Stephens Shire, MD  Vitals:   02/21/17 0920  Weight: 121 lb 12.8 oz (55.2 kg)        [facility] [employer] Weekly sees[facility] - 02/21/17 0900      Weekly Session   Topic Discussed Healthy eating tips   Minutes exercised this week 150 minutes  60cardio/60-90strength   Classes attended to date 6      Fun things you did since last meeting:" played w/all 5 grandkids" Barriers/struggles:"grazing" "people coming late/leaving early to class"  Vanita Ingles 02/21/2017, 9:21 AM

## 2017-02-25 ENCOUNTER — Encounter (HOSPITAL_COMMUNITY)
Admission: RE | Admit: 2017-02-25 | Discharge: 2017-02-25 | Disposition: A | Discharge status: Home / Self Care | Payer: Medicare Other | Source: Ambulatory Visit | Attending: Pulmonary Disease | Admitting: Pulmonary Disease

## 2017-02-27 ENCOUNTER — Encounter (HOSPITAL_COMMUNITY)
Admission: RE | Admit: 2017-02-27 | Discharge: 2017-02-27 | Disposition: A | Discharge status: Home / Self Care | Payer: Medicare Other | Source: Ambulatory Visit | Attending: Pulmonary Disease | Admitting: Pulmonary Disease

## 2017-03-04 ENCOUNTER — Encounter (HOSPITAL_COMMUNITY)
Admission: RE | Admit: 2017-03-04 | Discharge: 2017-03-04 | Disposition: A | Discharge status: Home / Self Care | Payer: Medicare Other | Source: Ambulatory Visit | Attending: Pulmonary Disease | Admitting: Pulmonary Disease

## 2017-03-05 NOTE — Progress Notes (Signed)
[facility] PREP Weekly Session   Patient Details  Name: Romon Devereux MRN: [id number] Date of Birth: 06-10-1946 Age: 71 y.o. PCP: Stephens Shire, MD  Vitals:   03/05/17 1251  Weight: 119 lb 6.4 oz (54.2 kg)        [facility] [employer] Weekly sees[facility] - 03/05/17 1200      Weekly Session   Topic Discussed Stress management and problem solving   Minutes exercised this week 180 minutes  90cardio/90strength   Classes attended to date 7     Fun things you did since last meeting:"NONE! Lung problems" Things you are grateful for:"Feeling better" "Getting to #10 question on HQ" Nutrition celebrations for the week:"candy/ice cream intake cut 50-75% Struggles:"weight loss"   Vanita Ingles 03/05/2017, 12:51 PM

## 2017-03-06 ENCOUNTER — Encounter (HOSPITAL_COMMUNITY)
Admission: RE | Admit: 2017-03-06 | Discharge: 2017-03-06 | Disposition: A | Discharge status: Home / Self Care | Payer: Medicare Other | Source: Ambulatory Visit | Attending: Pulmonary Disease | Admitting: Pulmonary Disease

## 2017-03-11 ENCOUNTER — Encounter (HOSPITAL_COMMUNITY)
Admission: RE | Admit: 2017-03-11 | Discharge: 2017-03-11 | Disposition: A | Discharge status: Home / Self Care | Payer: Medicare Other | Source: Ambulatory Visit | Attending: Pulmonary Disease | Admitting: Pulmonary Disease

## 2017-03-13 ENCOUNTER — Encounter (HOSPITAL_COMMUNITY)
Admission: RE | Admit: 2017-03-13 | Discharge: 2017-03-13 | Disposition: A | Discharge status: Home / Self Care | Payer: Medicare Other | Source: Ambulatory Visit | Attending: Pulmonary Disease | Admitting: Pulmonary Disease

## 2017-03-14 NOTE — Progress Notes (Signed)
[facility] PREP Weekly Session   Patient Details  Name: Jose Mckinney MRN: [id number] Date of Birth: December 06, 1946 Age: 71 y.o. PCP: Stephens Shire, MD  Vitals:   03/14/17 1207  Weight: 122 lb 9.6 oz (55.6 kg)        [facility] [employer] Weekly sees[facility] - 03/14/17 1200      Weekly Session   Topic Discussed Importance of resistance training   Minutes exercised this week 360 minutes  180cardio/180strength   Classes attended to date 8     Fun things you did since last meeting:"Got outside to garden 1 day" Things you are grateful for:"Gained 4 lbs & exercise program going well" Nutrition celebrations for the week:"exercise causing me to eat often and more!" Barriers:"Still unable to stay awake all day"   Vanita Ingles 03/14/2017, 12:08 PM

## 2017-03-18 ENCOUNTER — Encounter (HOSPITAL_COMMUNITY)
Admission: RE | Admit: 2017-03-18 | Discharge: 2017-03-18 | Disposition: A | Discharge status: Home / Self Care | Payer: Medicare Other | Source: Ambulatory Visit | Attending: Pulmonary Disease | Admitting: Pulmonary Disease

## 2017-03-20 ENCOUNTER — Encounter (HOSPITAL_COMMUNITY)
Admission: RE | Admit: 2017-03-20 | Discharge: 2017-03-20 | Disposition: A | Discharge status: Home / Self Care | Payer: Medicare Other | Source: Ambulatory Visit | Attending: Pulmonary Disease | Admitting: Pulmonary Disease

## 2017-03-25 ENCOUNTER — Encounter (HOSPITAL_COMMUNITY): Payer: Medicare Other

## 2017-03-25 DIAGNOSIS — J439 Emphysema, unspecified: Secondary | ICD-10-CM | POA: Insufficient documentation

## 2017-03-27 ENCOUNTER — Encounter (HOSPITAL_COMMUNITY)
Admission: RE | Admit: 2017-03-27 | Discharge: 2017-03-27 | Disposition: A | Discharge status: Home / Self Care | Payer: Self-pay | Source: Ambulatory Visit | Attending: Pulmonary Disease | Admitting: Pulmonary Disease

## 2017-04-01 ENCOUNTER — Encounter (HOSPITAL_COMMUNITY): Payer: Medicare Other

## 2017-04-03 ENCOUNTER — Encounter (HOSPITAL_COMMUNITY)
Admission: RE | Admit: 2017-04-03 | Discharge: 2017-04-03 | Disposition: A | Discharge status: Home / Self Care | Payer: Self-pay | Source: Ambulatory Visit | Attending: Pulmonary Disease | Admitting: Pulmonary Disease

## 2017-04-04 NOTE — Progress Notes (Signed)
[facility] [employer] PRE[facility] Weekly Session   Patient Details  Name: Jose Mckinney MRN: [id number] Date of Birth: December 10, 1946 Age: 71 y.o. PCP: Stephens Shire, MD  There were no vitals filed for this visit.      [facility] [employer] Weekly sees[facility] - 04/04/17 0900      Weekly Session   Topic Discussed Finding support   Minutes exercised this week 280 minutes  120cardio/120strength/56fexibility   Classes attended to date 924     Fun things you did since last meeting:"Cleaned a garage, garden work" Things you are grateful for:"warmer weather & Blood pressure under control!" Nutrition celebrations for the week:"My wife found a new menu" Barriers;" staying awake daytime hours.  Weight gain"   DVanita Ingles4/13/2018, 9:58 AM

## 2017-04-08 ENCOUNTER — Encounter (HOSPITAL_COMMUNITY)
Admission: RE | Admit: 2017-04-08 | Discharge: 2017-04-08 | Disposition: A | Discharge status: Home / Self Care | Payer: Self-pay | Source: Ambulatory Visit | Attending: Pulmonary Disease | Admitting: Pulmonary Disease

## 2017-04-10 ENCOUNTER — Encounter (HOSPITAL_COMMUNITY): Payer: Medicare Other

## 2017-04-15 ENCOUNTER — Encounter (HOSPITAL_COMMUNITY)
Admission: RE | Admit: 2017-04-15 | Discharge: 2017-04-15 | Disposition: A | Discharge status: Home / Self Care | Payer: Self-pay | Source: Ambulatory Visit | Attending: Pulmonary Disease | Admitting: Pulmonary Disease

## 2017-04-17 ENCOUNTER — Encounter (HOSPITAL_COMMUNITY)
Admission: RE | Admit: 2017-04-17 | Discharge: 2017-04-17 | Disposition: A | Discharge status: Home / Self Care | Payer: Self-pay | Source: Ambulatory Visit | Attending: Pulmonary Disease | Admitting: Pulmonary Disease

## 2017-04-22 ENCOUNTER — Encounter (HOSPITAL_COMMUNITY)
Admission: RE | Admit: 2017-04-22 | Discharge: 2017-04-22 | Disposition: A | Discharge status: Home / Self Care | Payer: Self-pay | Source: Ambulatory Visit | Attending: Pulmonary Disease | Admitting: Pulmonary Disease

## 2017-04-22 DIAGNOSIS — J439 Emphysema, unspecified: Secondary | ICD-10-CM | POA: Insufficient documentation

## 2017-04-24 ENCOUNTER — Ambulatory Visit (INDEPENDENT_AMBULATORY_CARE_PROVIDER_SITE_OTHER): Payer: Medicare Other | Admitting: Pulmonary Disease

## 2017-04-24 ENCOUNTER — Encounter: Payer: Self-pay | Admitting: Pulmonary Disease

## 2017-04-24 ENCOUNTER — Encounter (HOSPITAL_COMMUNITY)
Admission: RE | Admit: 2017-04-24 | Discharge: 2017-04-24 | Disposition: A | Discharge status: Home / Self Care | Payer: Self-pay | Source: Ambulatory Visit | Attending: Pulmonary Disease | Admitting: Pulmonary Disease

## 2017-04-24 VITALS — BP 108/60 | HR 75 | Ht 67.75 in | Wt 122.0 lb

## 2017-04-24 DIAGNOSIS — I272 Pulmonary hypertension, unspecified: Secondary | ICD-10-CM | POA: Diagnosis not present

## 2017-04-24 DIAGNOSIS — J432 Centrilobular emphysema: Secondary | ICD-10-CM

## 2017-04-24 DIAGNOSIS — R64 Cachexia: Secondary | ICD-10-CM

## 2017-04-24 DIAGNOSIS — J9611 Chronic respiratory failure with hypoxia: Secondary | ICD-10-CM | POA: Diagnosis not present

## 2017-04-24 DIAGNOSIS — J90 Pleural effusion, not elsewhere classified: Secondary | ICD-10-CM

## 2017-04-24 MED ORDER — TIOTROPIUM BROMIDE MONOHYDRATE 2.5 MCG/ACT IN AERS: 1.0000 | INHALATION_SPRAY | Freq: Every day | RESPIRATORY_TRACT | 0 refills | Status: DC

## 2017-04-24 MED ORDER — TIOTROPIUM BROMIDE MONOHYDRATE 18 MCG IN CAPS: 18.0000 ug | ORAL_CAPSULE | Freq: Every day | RESPIRATORY_TRACT | 12 refills | Status: DC

## 2017-04-24 NOTE — Addendum Note (Signed)
Addended by: Virl Cagey on: 04/24/2017 04:10 PM   Modules accepted: Orders

## 2017-04-24 NOTE — Patient Instructions (Signed)
Spiriva one puff daily  Follow up in 6 months

## 2017-04-24 NOTE — Progress Notes (Signed)
Current Outpatient Prescriptions on File Prior to Visit  Medication Sig  . albuterol (PROVENTIL) (2.5 MG/3ML) 0.083% nebulizer solution Take 3 mLs (2.5 mg total) by nebulization every 6 (six) hours as needed for wheezing or shortness of breath.  . feeding supplement (BOOST HIGH PROTEIN) LIQD Take 1 Container by mouth daily.   . fluticasone (FLONASE) 50 MCG/ACT nasal spray Place 2 sprays into both nostrils daily as needed for allergies or rhinitis.  Marland Kitchen ipratropium (ATROVENT) 0.02 % nebulizer solution Take 2.5 mLs (0.5 mg total) by nebulization 4 (four) times daily.  Marland Kitchen losartan (COZAAR) 100 MG tablet Take 50 mg by mouth daily.   . metoprolol succinate (TOPROL-XL) 100 MG 24 hr tablet Take 50 mg by mouth daily.   . sertraline (ZOLOFT) 50 MG tablet Take 50 mg by mouth 2 (two) times daily.  . sildenafil (REVATIO) 20 MG tablet Take 1 tablet (20 mg total) by mouth 3 (three) times daily.  Marland Kitchen torsemide (DEMADEX) 20 MG tablet Take 20 mg by mouth daily.   No current facility-administered medications on file prior to visit.      Chief Complaint  Patient presents with  . Follow-up    Recert for O2 today. Pt notes some low O2 sats on 2 liters O2 and he will increase O2 to 3 liters with heavier exertion. Pt states that breathing is okay except with heavy exertional acitvities.      Pulmonary tests CT chest 05/30/14 >> loculated Lt effusion, free flowing Rt effusion Echo 06/09/14 >> EF 60 to 47%, grade 1 diastolic dysfx, mild MR  Past medical history HTN, Depression, Anxiety, Sciatica  Past surgical history, Family history, Social history, Allergies reviewed  Vital Signs BP 108/60 (BP Location: Left Arm, Cuff Size: Normal)   Pulse 75   Ht 5' 7.75" (1.721 m)   Wt 122 lb (55.3 kg)   BMI 18.69 kg/m   History of Present Illness Jose Mckinney is a 71 y.o. male former smoker with COPD/emphysema, chronic loculated pleural effusion, and pulmonary hypertension.  He had oxygen certification done today.   Needs 2 liters.  He is working with Pulmonary rehab.  Has occasional cough.  No wheeze, chest pain, fever, leg swelling, or hemoptysis.    He noticed his blood pressure has been low, and he hasn't take blood pressure pills for past 1.5 days.  Still taking revatio.   Physical Exam  General - pleasant, thin, wearing oxygen Eyes - pupils reactive ENT - no sinus tenderness, no oral exudate, no LAN Cardiac - regular, no murmur Chest - no wheeze, rales, decreased BS on Lt  Abd - soft, non tender Ext - no edema Skin - no rashes Neuro - normal strength Psych - normal mood    Assessment/Plan  COPD/emphysema. - will have him try spiriva - continue albuterol/ipratropium prn  Chronic loculated left pleural effusion. - monitor clinically  Pulmonary hypertension. - he has been on revatio since he was living in [place] - he is tolerating this and feels it has helped - continue revatio  Cachexia. - he will f/u with PCP  Time spent 26 minutes  Patient Instructions  Spiriva one puff daily  Follow up in 6 months    Chesley Mires, MD [place] [facility] Pager:  [phone] 04/24/2017, 3:34 PM

## 2017-04-29 ENCOUNTER — Encounter (HOSPITAL_COMMUNITY)
Admission: RE | Admit: 2017-04-29 | Discharge: 2017-04-29 | Disposition: A | Discharge status: Home / Self Care | Payer: Self-pay | Source: Ambulatory Visit | Attending: Pulmonary Disease | Admitting: Pulmonary Disease

## 2017-05-01 ENCOUNTER — Encounter (HOSPITAL_COMMUNITY)
Admission: RE | Admit: 2017-05-01 | Discharge: 2017-05-01 | Disposition: A | Discharge status: Home / Self Care | Payer: Self-pay | Source: Ambulatory Visit | Attending: Pulmonary Disease | Admitting: Pulmonary Disease

## 2017-05-02 NOTE — Progress Notes (Signed)
[facility] [employer] PREP Weekly Session   Patient Details  Name: Jose Mckinney MRN: [id number] Date of Birth: [birth year] Age: 71 y.o. PCP: Stephens Shire, MD  Vitals:   04/23/17 0850  Weight: 123 lb (55.8 kg)        [facility] [employer] Weekly seesion - 05/02/17 0800      Weekly Session   Topic Discussed Goal setting and welcome to the program;Other  fat calories   Minutes exercised this week 300 minutes  180cardio/60strength/55fexibility   Classes attended to date 10     Barriers/struggles:"weight gain, morale"  DVanita Ingles5/10/2017, 8:52 AM

## 2017-05-06 ENCOUNTER — Telehealth: Payer: Self-pay | Admitting: Pulmonary Disease

## 2017-05-06 ENCOUNTER — Encounter (HOSPITAL_COMMUNITY)
Admission: RE | Admit: 2017-05-06 | Discharge: 2017-05-06 | Disposition: A | Discharge status: Home / Self Care | Payer: Self-pay | Source: Ambulatory Visit | Attending: Pulmonary Disease | Admitting: Pulmonary Disease

## 2017-05-06 MED ORDER — TIOTROPIUM BROMIDE MONOHYDRATE 18 MCG IN CAPS: 18.0000 ug | ORAL_CAPSULE | Freq: Every day | RESPIRATORY_TRACT | 12 refills | Status: DC

## 2017-05-06 NOTE — Telephone Encounter (Signed)
Spoke with pt, rx was sent to wrong pharmacy- called in rx to correct pharmacy.  Nothing further needed.

## 2017-05-06 NOTE — Telephone Encounter (Signed)
Patient returned phone call..ert ° °

## 2017-05-06 NOTE — Telephone Encounter (Signed)
lmtcb X1 

## 2017-05-08 ENCOUNTER — Encounter (HOSPITAL_COMMUNITY): Payer: Self-pay

## 2017-05-13 ENCOUNTER — Encounter (HOSPITAL_COMMUNITY): Payer: Self-pay

## 2017-05-15 ENCOUNTER — Encounter (HOSPITAL_COMMUNITY)
Admission: RE | Admit: 2017-05-15 | Discharge: 2017-05-15 | Disposition: A | Discharge status: Home / Self Care | Payer: Self-pay | Source: Ambulatory Visit | Attending: Pulmonary Disease | Admitting: Pulmonary Disease

## 2017-05-20 ENCOUNTER — Encounter (HOSPITAL_COMMUNITY): Payer: Self-pay

## 2017-05-22 ENCOUNTER — Encounter (HOSPITAL_COMMUNITY): Payer: Self-pay

## 2017-05-27 ENCOUNTER — Encounter (HOSPITAL_COMMUNITY)
Admission: RE | Admit: 2017-05-27 | Discharge: 2017-05-27 | Disposition: A | Discharge status: Home / Self Care | Payer: Self-pay | Source: Ambulatory Visit | Attending: Pulmonary Disease | Admitting: Pulmonary Disease

## 2017-05-27 DIAGNOSIS — J439 Emphysema, unspecified: Secondary | ICD-10-CM | POA: Insufficient documentation

## 2017-05-29 ENCOUNTER — Encounter (HOSPITAL_COMMUNITY): Payer: Self-pay

## 2017-06-03 ENCOUNTER — Encounter (HOSPITAL_COMMUNITY)
Admission: RE | Admit: 2017-06-03 | Discharge: 2017-06-03 | Disposition: A | Discharge status: Home / Self Care | Payer: Self-pay | Source: Ambulatory Visit | Attending: Pulmonary Disease | Admitting: Pulmonary Disease

## 2017-06-05 ENCOUNTER — Encounter (HOSPITAL_COMMUNITY)
Admission: RE | Admit: 2017-06-05 | Discharge: 2017-06-05 | Disposition: A | Discharge status: Home / Self Care | Payer: Self-pay | Source: Ambulatory Visit | Attending: Pulmonary Disease | Admitting: Pulmonary Disease

## 2017-06-10 ENCOUNTER — Encounter (HOSPITAL_COMMUNITY)
Admission: RE | Admit: 2017-06-10 | Discharge: 2017-06-10 | Disposition: A | Discharge status: Home / Self Care | Payer: Self-pay | Source: Ambulatory Visit | Attending: Pulmonary Disease | Admitting: Pulmonary Disease

## 2017-06-12 ENCOUNTER — Encounter: Payer: Self-pay | Admitting: Cardiovascular Disease

## 2017-06-12 ENCOUNTER — Encounter (INDEPENDENT_AMBULATORY_CARE_PROVIDER_SITE_OTHER): Payer: Self-pay

## 2017-06-12 ENCOUNTER — Encounter (HOSPITAL_COMMUNITY)
Admission: RE | Admit: 2017-06-12 | Discharge: 2017-06-12 | Disposition: A | Discharge status: Home / Self Care | Payer: Self-pay | Source: Ambulatory Visit | Attending: Pulmonary Disease | Admitting: Pulmonary Disease

## 2017-06-12 ENCOUNTER — Ambulatory Visit (INDEPENDENT_AMBULATORY_CARE_PROVIDER_SITE_OTHER): Payer: Medicare Other | Admitting: Cardiovascular Disease

## 2017-06-12 VITALS — BP 102/60 | HR 76 | Resp 18 | Ht 67.0 in | Wt 120.0 lb

## 2017-06-12 DIAGNOSIS — R0602 Shortness of breath: Secondary | ICD-10-CM

## 2017-06-12 DIAGNOSIS — I5032 Chronic diastolic (congestive) heart failure: Secondary | ICD-10-CM | POA: Diagnosis not present

## 2017-06-12 NOTE — Patient Instructions (Signed)
Medication Instructions:  Your physician has recommended you make the following change in your medication:  1. STOP Losartan 2. STOP Metoprolol Succinate  Labwork: No new orders.   Testing/Procedures: Your physician has requested that you have an echocardiogram. Echocardiography is a painless test that uses sound waves to create images of your heart. It provides your doctor with information about the size and shape of your heart and how well your heart's chambers and valves are working. This procedure takes approximately one hour. There are no restrictions for this procedure.  Follow-Up: Your physician recommends that you schedule a follow-up appointment in: 1 MONTH with Richardson Dopp PA-C   Any Other Special Instructions Will Be Listed Below (If Applicable).     If you need a refill on your cardiac medications before your next appointment, please call your pharmacy.

## 2017-06-12 NOTE — Progress Notes (Signed)
Cardiology Office Note Date:  06/13/2017   ID:  Jose Mckinney, DOB 1946/04/15, MRN [id number]  PCP:  Stephens Shire, MD  Cardiologist:  Sherren Mocha, MD    Chief Complaint  Patient presents with  . Shortness of Breath     History of Present Illness: Jose Mckinney is a 71 y.o. male who presents for evaluation of congestive heart failure. He is referred by Clyde Lundborg, PA-C with [facility] in [place].   The patient relocated here from [place], [place]. He was a 2-3 ppd smoker for 50 years quit 3 years ago. He is referred for evaluation of congestive heart failure. He has been treated with sildenafil for pulmonary hypertension. The patient has declined over the last several years with weight loss and progressive shortness of breath.  There is an echocardiogram on file from 2016. This demonstrates normal left ventricular size and systolic function with an LVEF of 60-65% and pseudo-normal filling pattern with grade 2 diastolic dysfunction. The right ventricle is described as mildly enlarged with mildly impaired systolic function. There is mild to moderate tricuspid regurgitation with severe pulmonary hypertension, RV systolic pressure estimated at 78 mmHg. There was no pericardial effusion present. There are 2 cardiac catheterization reports from December 2015 meter are accessible through care everywhere at this time.  The patient is here with his wife today. He has been on 24/7 oxygen for the past few years. He is short of breath with bending over or with limited activity such as walking back and forth to his mailbox, or with talking. Over the past month he has a tightness in the chest and cramping in the abdomen. He has lost 45# since 2013 with poor appetite. He has longstanding orthopnea and has slept in a recliner since 2013. He denies chest pain or pressure. He denies lightheadedness or syncope. States his blood pressure used to be elevated requiring antihypertensive  medication, but more recently his blood pressure has been running low requiring decrease of his medication.  Past Medical History:  Diagnosis Date  . Anxiety   . CHF (congestive heart failure) (HCC)    x 3 episodes- [place]. pt. living here since 11-'1-16  . COPD with emphysema ([place])   . Depression   . HTN (hypertension)   . Pulmonary HTN (HCC)    Dr. Marlyn Corporal is following.  . Recurrent left pleural effusion   . Sciatic leg pain    INTERMITTENT- occ. uses Hydrocodone as needed  . Seasonal allergies     Past Surgical History:  Procedure Laterality Date  . ANGIOPLASTY    . CARDIAC CATHETERIZATION     '15- [place]- no issues now.  . COLONOSCOPY WITH PROPOFOL N/A 02/09/2016   Procedure: COLONOSCOPY WITH PROPOFOL;  Surgeon: Carol Ada, MD;  Location: [facility];  Service: Endoscopy;  Laterality: N/A;  . MASTOIDECTOMY Left   . TONSILLECTOMY      Current Outpatient Prescriptions  Medication Sig Dispense Refill  . albuterol (PROVENTIL) (2.5 MG/3ML) 0.083% nebulizer solution Take 3 mLs (2.5 mg total) by nebulization every 6 (six) hours as needed for wheezing or shortness of breath. 75 mL 12  . feeding supplement (BOOST HIGH PROTEIN) LIQD Take 1 Container by mouth daily.     . fluticasone (FLONASE) 50 MCG/ACT nasal spray Place 2 sprays into both nostrils daily as needed for allergies or rhinitis.    Marland Kitchen ipratropium (ATROVENT) 0.02 % nebulizer solution Take 2.5 mLs (0.5 mg total) by nebulization 4 (four) times daily. 75 mL  12  . levofloxacin (LEVAQUIN) 750 MG tablet Take 750 mg by mouth 2 (two) times daily.    . sertraline (ZOLOFT) 50 MG tablet Take 50 mg by mouth 2 (two) times daily.    . sildenafil (REVATIO) 20 MG tablet Take 1 tablet (20 mg total) by mouth 3 (three) times daily. 90 tablet 6  . tiotropium (SPIRIVA HANDIHALER) 18 MCG inhalation capsule Place 1 capsule (18 mcg total) into inhaler and inhale daily. 30 capsule 12  . torsemide (DEMADEX) 20 MG tablet Take  20 mg by mouth daily.    . Tiotropium Bromide Monohydrate (SPIRIVA RESPIMAT) 2.5 MCG/ACT AERS Inhale 1 puff into the lungs daily. 1 Inhaler 0   No current facility-administered medications for this visit.     Allergies:   Patient has no known allergies.   Social History:  The patient  reports that he quit smoking about 2 years ago. His smoking use included Cigarettes. He has a 125.00 pack-year smoking history. He has never used smokeless tobacco. He reports that he drinks alcohol. He reports that he does not use drugs.   Family History:  The patient's He was adopted. Family history is unknown by patient.    ROS:  Please see the history of present illness.  Otherwise, review of systems is positive for visual disturbance, abdominal pain, depression, excessive fatigue.  All other systems are reviewed and negative.    PHYSICAL EXAM: VS:  BP 102/60   Pulse 76   Resp 18   Ht 5\' 7"  (1.702 m)   Wt 120 lb (54.4 kg)   SpO2 96%   BMI 18.79 kg/m  , BMI Body mass index is 18.79 kg/m. GEN: cachectic male on O2, in no acute distress  HEENT: normal  Neck: no JVD, no masses. No carotid bruits Cardiac: RRR without murmur or gallop, loud P2               Respiratory:  clear to auscultation bilaterally, normal work of breathing, distant lung sounds GI: soft, nontender, nondistended, + BS MS: no deformity or atrophy  Ext: no pretibial edema Skin: warm and dry, no rash Neuro:  Strength and sensation are intact Psych: euthymic mood, full affect  EKG:  EKG is ordered today. The ekg ordered today shows normal sinus rhythm with frequent PVCs 73 bpm, otherwise within normal limits.  Recent Labs: No results found for requested labs within last 8760 hours.   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 This is now complicated by hypotension and severe protein calorie malnutrition with nearly 50 pounds weight loss over the last 3 years. We discussed management strategies. He will continue on torsemide 20 mg daily. I reviewed his most recent lab work. I recommended discontinuation of losartan and metoprolol succinate in the context of hypotension. Will update an echocardiogram to reassess RV function and estimate pulmonary pressures. We discussed the fact that sildenafil is indicated for primary pulmonary hypertension but I did not make any changes with this today. The patient is followed by Dr. Halford Chessman and this medication was previously started in [place] where he felt like there was some symptomatic benefit.  The patient had a cardiac catheterization in 2015 and we will send for those records. I am unable to access the report through care everywhere. Will arrange follow-up in one month to assess any changes in his clinical status after discontinuation of metoprolol and losartan. Will also review his echo study when he returns. Unfortunately I think his overall prognosis is quite guarded.  Current medicines are reviewed with the patient today.  The patient does not have concerns regarding medicines.  Labs/ tests ordered today include:   Orders Placed This Encounter  Procedures  . ECHOCARDIOGRAM COMPLETE    Disposition:   FU one month with Richardson Dopp, PA-C.   Deatra James, MD  06/13/2017 4:41 PM    [facility] [place], [place], [place]  [address] Phone: [phone]; Fax: [phone]

## 2017-06-13 ENCOUNTER — Encounter: Payer: Self-pay | Admitting: Cardiovascular Disease

## 2017-06-19 ENCOUNTER — Encounter (HOSPITAL_COMMUNITY)
Admission: RE | Admit: 2017-06-19 | Discharge: 2017-06-19 | Disposition: A | Discharge status: Home / Self Care | Payer: Self-pay | Source: Ambulatory Visit | Attending: Internal Medicine | Admitting: Internal Medicine

## 2017-06-26 ENCOUNTER — Encounter (HOSPITAL_COMMUNITY): Admission: RE | Admit: 2017-06-26 | Payer: Self-pay | Source: Ambulatory Visit

## 2017-06-30 ENCOUNTER — Other Ambulatory Visit: Payer: Self-pay

## 2017-06-30 DIAGNOSIS — I50812 Chronic right heart failure: Secondary | ICD-10-CM

## 2017-07-01 ENCOUNTER — Encounter (HOSPITAL_COMMUNITY): Payer: Self-pay

## 2017-07-01 DIAGNOSIS — J439 Emphysema, unspecified: Secondary | ICD-10-CM | POA: Insufficient documentation

## 2017-07-03 ENCOUNTER — Encounter (HOSPITAL_COMMUNITY): Admission: RE | Admit: 2017-07-03 | Payer: Self-pay | Source: Ambulatory Visit

## 2017-07-03 ENCOUNTER — Ambulatory Visit (HOSPITAL_COMMUNITY): Payer: Medicare Other | Attending: Cardiology

## 2017-07-03 ENCOUNTER — Other Ambulatory Visit: Payer: Self-pay

## 2017-07-03 DIAGNOSIS — I5032 Chronic diastolic (congestive) heart failure: Secondary | ICD-10-CM | POA: Insufficient documentation

## 2017-07-03 DIAGNOSIS — R0602 Shortness of breath: Secondary | ICD-10-CM

## 2017-07-08 ENCOUNTER — Encounter (HOSPITAL_COMMUNITY): Admission: RE | Admit: 2017-07-08 | Payer: Self-pay | Source: Ambulatory Visit

## 2017-07-10 ENCOUNTER — Encounter (HOSPITAL_COMMUNITY): Payer: Self-pay

## 2017-07-15 ENCOUNTER — Encounter: Payer: Self-pay | Admitting: Physician Assistant

## 2017-07-15 ENCOUNTER — Ambulatory Visit (INDEPENDENT_AMBULATORY_CARE_PROVIDER_SITE_OTHER): Payer: Medicare Other | Admitting: Physician Assistant

## 2017-07-15 ENCOUNTER — Ambulatory Visit
Admission: RE | Admit: 2017-07-15 | Discharge: 2017-07-15 | Disposition: A | Discharge status: Home / Self Care | Payer: Medicare Other | Source: Ambulatory Visit | Attending: Physician Assistant | Admitting: Physician Assistant

## 2017-07-15 ENCOUNTER — Encounter (HOSPITAL_COMMUNITY): Payer: Self-pay

## 2017-07-15 VITALS — BP 98/60 | HR 54 | Ht 67.0 in | Wt 119.0 lb

## 2017-07-15 DIAGNOSIS — J439 Emphysema, unspecified: Secondary | ICD-10-CM

## 2017-07-15 DIAGNOSIS — J449 Chronic obstructive pulmonary disease, unspecified: Secondary | ICD-10-CM | POA: Diagnosis not present

## 2017-07-15 DIAGNOSIS — I272 Pulmonary hypertension, unspecified: Secondary | ICD-10-CM | POA: Diagnosis not present

## 2017-07-15 DIAGNOSIS — I50812 Chronic right heart failure: Secondary | ICD-10-CM

## 2017-07-15 DIAGNOSIS — I2781 Cor pulmonale (chronic): Secondary | ICD-10-CM | POA: Insufficient documentation

## 2017-07-15 DIAGNOSIS — R64 Cachexia: Secondary | ICD-10-CM | POA: Diagnosis not present

## 2017-07-15 MED ORDER — TORSEMIDE 20 MG PO TABS: 20.0000 mg | ORAL_TABLET | ORAL | 3 refills | Status: DC

## 2017-07-15 NOTE — Progress Notes (Signed)
Cardiology Office Note:    Date:  07/15/2017   ID:  Jose Mckinney, DOB [birth year], MRN [id number]  PCP:  [name], PA-C  Cardiologist:  Dr. Sherren Mocha    Referring MD: Stephens Shire, MD   Chief Complaint  Patient presents with  . Follow-up    R sided HF    History of Present Illness:    Jose Mckinney is a 71 y.o. male with a hx of R sided HF, pulmonary hypertension, COPD, HTN, former tobacco abuse. He relocated from [place], [place].  He is on continuous O2.   He established with Dr. Burt Knack in 6/18. He was noted to be in declining health with loss of 45 pounds since 2013 and noted hypotension. Overall, it was felt that he has right-sided heart failure due to cor pulmonale with underlying severe O2 dependent COPD. Losartan and metoprolol were discontinued secondary to hypotension. Cardiac catheterization records were requested from [place]. I cannot find any reports in the chart at this time.  [name] returns for follow up.  He is here with his wife.  He notes worsening dyspnea.  He also notes worsening cough with sputum production.  He denies hemoptysis.  He sleeps on an incline chronically. He denies LE edema.  He denies syncope.  He denies fever.  He has had L sided chest pain for the last 3 months that is fairly constant.  He states, "my lungs are filling up again."  He has a hx of para-pneumonic effusion s/p thoracentesis in 2016.    Prior CV studies:   The following studies were reviewed today:  Echo 07/03/17 Moderate LVH, EF 65-70, normal wall motion, grade 2 diastolic dysfunction, moderate LAE, PASP 47  Echo 6/16 ([facility]) 1. There is normal global left ventricular contractility. 2. Overall left ventricular systolic function is normal with an EF between 60 - 65 %. 3. There is mild concentric left ventricular hypertrophy. 4. The right ventricle is mildly enlarged. 5. The right ventricular systolic function is mildly impaired. 6. There is mild  aortic valve sclerosis without stenosis. 7. Mild mitral annular calcification present. 8. Mild-to-moderate tricuspid regurgitation present. 9. There is severe pulmonary hypertension, with RVSP as reported above.  Past Medical History:  Diagnosis Date  . Anxiety   . CHF (congestive heart failure) (HCC)    x 3 episodes- [place]. pt. living here since 11-'1-16  . COPD with emphysema ([place])   . Depression   . HTN (hypertension)   . Pulmonary HTN (HCC)    Dr. Marlyn Corporal is following.  . Recurrent left pleural effusion   . Sciatic leg pain    INTERMITTENT- occ. uses Hydrocodone as needed  . Seasonal allergies     Past Surgical History:  Procedure Laterality Date  . ANGIOPLASTY    . CARDIAC CATHETERIZATION     '15- [place]- no issues now.  . COLONOSCOPY WITH PROPOFOL N/A 02/09/2016   Procedure: COLONOSCOPY WITH PROPOFOL;  Surgeon: Carol Ada, MD;  Location: [facility];  Service: Endoscopy;  Laterality: N/A;  . MASTOIDECTOMY Left   . TONSILLECTOMY      Current Medications: Current Meds  Medication Sig  . albuterol (PROVENTIL) (2.5 MG/3ML) 0.083% nebulizer solution Take 3 mLs (2.5 mg total) by nebulization every 6 (six) hours as needed for wheezing or shortness of breath.  . feeding supplement (BOOST HIGH PROTEIN) LIQD Take 1 Container by mouth daily.   . fluticasone (FLONASE) 50 MCG/ACT nasal spray Place 2 sprays into both  nostrils daily as needed for allergies or rhinitis.  Marland Kitchen ipratropium (ATROVENT) 0.02 % nebulizer solution Take 2.5 mLs (0.5 mg total) by nebulization 4 (four) times daily.  . sertraline (ZOLOFT) 50 MG tablet Take 50 mg by mouth 2 (two) times daily.  . sildenafil (REVATIO) 20 MG tablet Take 1 tablet (20 mg total) by mouth 3 (three) times daily.  Marland Kitchen tiotropium (SPIRIVA HANDIHALER) 18 MCG inhalation capsule Place 1 capsule (18 mcg total) into inhaler and inhale daily.  . Tiotropium Bromide Monohydrate (SPIRIVA RESPIMAT) 2.5 MCG/ACT AERS Inhale 1 puff  into the lungs daily.  . [DISCONTINUED] torsemide (DEMADEX) 20 MG tablet Take 20 mg by mouth daily.     Allergies:   Patient has no known allergies.   Social History   Social History  . Marital status: Married    Spouse name: N/A  . Number of children: N/A  . Years of education: N/A   Occupational History  . Retired    Social History Main Topics  . Smoking status: Former Smoker    Packs/day: 2.50    Years: 50.00    Types: Cigarettes    Quit date: 07/10/2014  . Smokeless tobacco: Never Used     Comment: Smoked 2.5-3 PPD at highest amount.   . Alcohol use 0.0 oz/week     Comment: 4-5 beers daily  . Drug use: No  . Sexual activity: Not Asked   Other Topics Concern  . None   Social History Narrative   Patient is adopted -- UNKNOWN family history     Family Hx: The patient's He was adopted. Family history is unknown by patient.  ROS:   Please see the history of present illness.    ROS All other systems reviewed and are negative.   EKGs/Labs/Other Test Reviewed:    EKG:  EKG is not ordered today.  The ekg ordered today demonstrates n/a  Recent Labs: No results found for requested labs within last 8760 hours.   Recent Lipid Panel No results found for: CHOL, TRIG, HDL, CHOLHDL, LDLCALC, LDLDIRECT  Physical Exam:    VS:  BP 98/60   Pulse (!) 54   Ht 5\' 7"  (1.702 m)   Wt 119 lb (54 kg)   SpO2 94%   BMI 18.64 kg/m     Wt Readings from Last 3 Encounters:  07/15/17 119 lb (54 kg)  06/13/17 120 lb (54.4 kg)  04/24/17 122 lb (55.3 kg)     Physical Exam  Constitutional: He is oriented to person, place, and time. He appears cachectic. No distress.  HENT:  Head: Normocephalic and atraumatic.  Eyes: No scleral icterus.  Neck: Normal range of motion. No JVD present.  Cardiovascular: Normal rate, regular rhythm, S1 normal and S2 normal.   No murmur heard. Pulmonary/Chest: Effort normal. He has decreased breath sounds. He has no wheezes. He has no rhonchi. He  has no rales.  Abdominal: Soft. There is no tenderness.  Musculoskeletal: He exhibits no edema.  Neurological: He is alert and oriented to person, place, and time.  Skin: Skin is warm and dry.  Psychiatric: He has a normal mood and affect.    ASSESSMENT:    1. Pulmonary emphysema, unspecified emphysema type ([place])   2. Chronic right-sided heart failure (HCC)   3. Pulmonary hypertension ([place])   4. 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)    PLAN:    In order of problems listed above:  1. Pulmonary emphysema, unspecified emphysema type ([place]) -  He  notes worsening dyspnea as well as worsening cough. He also has chest discomfort. His recent echocardiogram demonstrated normal LV function with moderate diastolic dysfunction. His pulmonary pressures were actually better than his previous echocardiogram done in [place]. He feels like he did prior to thoracentesis for parapneumonic effusion several years ago. He had a recent chest x-ray in June 2018 that demonstrated bilateral pulmonary infiltrates and pleural effusions consistent with bilateral pneumonia. He did take a week of antibiotics at that time. I think his symptoms are mainly due to end-stage emphysema. He needs earlier follow-up with pulmonology. I will arrange a chest x-ray today and he will see Dr. Chase Caller tomorrow.  2. Chronic right-sided heart failure (HCC) -  His exam is not consistent with volume excess. His blood pressure now runs quite low. I have recommended that he decrease his torsemide to 20 mg every Monday Wednesday Friday and 10 mg all other days.  3. Pulmonary hypertension ([place]) -  He has been on sildenafil for several years. Recent PASP on Echo was 47. RVSP by echo in 2016 was 78.   4. 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 As noted above, I suspect he is nearing end-stage emphysema. He should continue close follow-up with pulmonology.  Dispo:  Return  in about 6 months (around 01/15/2018) for Routine Follow Up, w/ Dr. Burt Knack.   Medication Adjustments/Labs and Tests Ordered: Current medicines are reviewed at length with the patient today.  Concerns regarding medicines are outlined above.  Tests Ordered: Orders Placed This Encounter  Procedures  . DG Chest 2 View   Medication Changes: Meds ordered this encounter  Medications  . torsemide (DEMADEX) 20 MG tablet    Sig: Take 1 tablet (20 mg total) by mouth as directed. Take whole tablet every Mon, Wed and Fri's; take 1/2 tablet all other days    Dispense:  180 tablet    Refill:  3    Signed, Richardson Dopp, PA-C  07/15/2017 1:20 PM    [facility] [address], [place], [place]  [address] Phone: [phone]; Fax: [phone]

## 2017-07-15 NOTE — Patient Instructions (Signed)
Medication Instructions:  CHANGE TORSEMIDE TO TAKE 20 MG (WHOLE TABLET) EVERY MON, WED AND FRI'S AND 10 MG (1/2 TABLET) ALL OTHER DAYS OF THE WEEK  Labwork: NONE ORDERED TODAY  Testing/Procedures: A chest x-ray takes a picture of the organs and structures inside the chest, including the heart, lungs, and blood vessels. This test can show several things, including, whether the heart is enlarges; whether fluid is building up in the lungs; and whether pacemaker / defibrillator leads are still in place. THIS IS TO BE DONE TODAY AT [facility]   Follow-Up: 1. Your physician wants you to follow-up in: 6 MONTHS WITH DR. Emelda Fear will receive a reminder letter in the mail two months in advance. If you don't receive a letter, please call our office to schedule the follow-up appointment.  2. DR. Chase Caller 9 AM 07/16/17 @ [facility]  Any Other Special Instructions Will Be Listed Below (If Applicable).     If you need a refill on your cardiac medications before your next appointment, please call your pharmacy.

## 2017-07-16 ENCOUNTER — Ambulatory Visit (INDEPENDENT_AMBULATORY_CARE_PROVIDER_SITE_OTHER)
Admission: RE | Admit: 2017-07-16 | Discharge: 2017-07-16 | Disposition: A | Discharge status: Home / Self Care | Payer: Medicare Other | Source: Ambulatory Visit | Attending: Internal Medicine | Admitting: Internal Medicine

## 2017-07-16 ENCOUNTER — Ambulatory Visit (INDEPENDENT_AMBULATORY_CARE_PROVIDER_SITE_OTHER): Payer: Medicare Other | Admitting: Internal Medicine

## 2017-07-16 ENCOUNTER — Telehealth: Payer: Self-pay | Admitting: *Deleted

## 2017-07-16 ENCOUNTER — Encounter: Payer: Self-pay | Admitting: Internal Medicine

## 2017-07-16 VITALS — BP 90/52 | HR 92 | Ht 67.0 in | Wt 120.2 lb

## 2017-07-16 DIAGNOSIS — J9611 Chronic respiratory failure with hypoxia: Secondary | ICD-10-CM | POA: Diagnosis not present

## 2017-07-16 DIAGNOSIS — R0789 Other chest pain: Secondary | ICD-10-CM

## 2017-07-16 DIAGNOSIS — J449 Chronic obstructive pulmonary disease, unspecified: Secondary | ICD-10-CM

## 2017-07-16 DIAGNOSIS — Z8709 Personal history of other diseases of the respiratory system: Secondary | ICD-10-CM | POA: Diagnosis not present

## 2017-07-16 NOTE — Telephone Encounter (Signed)
-----   Message from Liliane Shi, [place] sent at 07/15/2017  4:44 PM EDT ----- Please call the patient. The chest X-ray shows some fluid collection in the R lung and a larger collection in the L lung. The findings are similar to the chest X-ray done in 6/18. But, these findings need close follow up.  Keep appointment with the lung doctor tomorrow as planned for further eval and management. Please fax a copy of this study result to his PCP:  [name], PA-C  Thanks! Richardson Dopp, PA-C    07/15/2017 4:43 PM

## 2017-07-16 NOTE — Telephone Encounter (Signed)
Ptcb and has been notified of CXR results. Pt has appt today for CT per Dr. Chase Caller. Pt verbalized understanding to results given. Pt thanked me for my call today.

## 2017-07-16 NOTE — Progress Notes (Signed)
Subjective:     Patient ID: Jose Mckinney, male   DOB: [birth year], 71 y.o.   MRN: [id number]  HPI   OV 07/16/2017  Chief Complaint  Patient presents with  . Acute Visit    VS pt. Pt c/o chest pressure, increase in SOB x 2 months. Pt states the s/s are slowly getting worse. Pt denies f/c/s.    71 year old male transferred from [place]. He has advanced COPD with chronic hypoxic respiratory failure and COPD cachexia with significant weight loss over the last several years. He has associated cor pulmonale and is on sildenafil for that. Is also on 2 L chronic oxygen therapy. He has a history of chronic loculated pleural effusion from 2016 that was drained with residual loculation on the left side. He now tells me that and he is here acutely because of the last 1 month he's had chest tightness that is chronic moderate in intensity intermittent and present at rest not related to exertion without any radiation. The quality is gnawing and it feels similar to the loculated pleural effusion worsening in 2016. This no worsening shortness of breath edema or cough or sputum production. This no change in sputum. It does not feel like a COPD exacerbation. He is compliant with all his COPD medications. Of note for his COPD he is not on any BiPAP therapy. He is unsure if he is a hypercapnic patient are not  His chest x-ray from yesterday is unchanged from his baseline in my personal visualization of June 2018 History also gained from reviewing the chart. He is up cardiology yesterday. His recent echo shows cor pulmonale and diastolic dysfunction  Dg Chest 2 View  Result Date: 07/15/2017 CLINICAL DATA:  Shortness of breath, recurrent pleural effusion EXAM: CHEST  2 VIEW COMPARISON:  06/10/2017 FINDINGS: Hyperinflation with emphysematous changes. Small right pleural effusion with basilar scarring. Small moderate left pleural effusion, likely loculated, unchanged in appearance. Left basilar atelectasis or pneumonia.  Somewhat nodular appearing left lower lung zone opacity. Stable cardiomediastinal silhouette. Deformity/fracture of the mid left clavicle. IMPRESSION: 1. Small right pleural effusion slight increased from prior with scarring at the right base. Hyperinflation with emphysematous change 2. Small moderate left pleural effusion, without significant change, probably loculated. Nodular opacity in the left lower lung zone, also not significantly changed but cannot exclude lung mass. Recommend CT for further evaluation. 3. Left basilar consolidations may be due to atelectasis or pneumonia. These results will be called to the ordering clinician or representative by the Radiologist Assistant, and communication documented in the PACS or zVision Dashboard. Electronically Signed   By: Donavan Foil M.D.   On: 07/15/2017 15:19       has a past medical history of Anxiety; CHF (congestive heart failure) ([place]); COPD with emphysema ([place]); Depression; HTN (hypertension); Pulmonary HTN ([place]); Recurrent left pleural effusion; Sciatic leg pain; and Seasonal allergies.   reports that he quit smoking about 3 years ago. His smoking use included Cigarettes. He has a 125.00 pack-year smoking history. He has never used smokeless tobacco.  Past Surgical History:  Procedure Laterality Date  . ANGIOPLASTY    . CARDIAC CATHETERIZATION     '15- [place]- no issues now.  . COLONOSCOPY WITH PROPOFOL N/A 02/09/2016   Procedure: COLONOSCOPY WITH PROPOFOL;  Surgeon: Carol Ada, MD;  Location: [facility];  Service: Endoscopy;  Laterality: N/A;  . MASTOIDECTOMY Left   . TONSILLECTOMY      No Known Allergies  Immunization History  Administered Date(s) Administered  .  Influenza, High Dose Seasonal PF 09/23/2016  . Influenza, Quadrivalent, Recombinant, Inj, Pf 09/14/2015  . Influenza,inj,Quad PF,36+ Mos 09/14/2015  . Pneumococcal Conjugate-13 06/01/2015  . Pneumococcal Polysaccharide-23 09/14/2015  . Tdap 08/16/2012     Family History  Problem Relation Age of Onset  . Adopted: Yes  . Family history unknown: Yes     Current Outpatient Prescriptions:  .  albuterol (PROVENTIL) (2.5 MG/3ML) 0.083% nebulizer solution, Take 3 mLs (2.5 mg total) by nebulization every 6 (six) hours as needed for wheezing or shortness of breath., Disp: 75 mL, Rfl: 12 .  feeding supplement (BOOST HIGH PROTEIN) LIQD, Take 1 Container by mouth daily. , Disp: , Rfl:  .  fluticasone (FLONASE) 50 MCG/ACT nasal spray, Place 2 sprays into both nostrils daily as needed for allergies or rhinitis., Disp: , Rfl:  .  ipratropium (ATROVENT) 0.02 % nebulizer solution, Take 2.5 mLs (0.5 mg total) by nebulization 4 (four) times daily., Disp: 75 mL, Rfl: 12 .  sertraline (ZOLOFT) 50 MG tablet, Take 50 mg by mouth 2 (two) times daily., Disp: , Rfl:  .  sildenafil (REVATIO) 20 MG tablet, Take 1 tablet (20 mg total) by mouth 3 (three) times daily., Disp: 90 tablet, Rfl: 6 .  tiotropium (SPIRIVA HANDIHALER) 18 MCG inhalation capsule, Place 1 capsule (18 mcg total) into inhaler and inhale daily., Disp: 30 capsule, Rfl: 12 .  torsemide (DEMADEX) 20 MG tablet, Take 1 tablet (20 mg total) by mouth as directed. Take whole tablet every Mon, Wed and Fri's; take 1/2 tablet all other days, Disp: 180 tablet, Rfl: 3   Review of Systems     Objective:   Physical Exam  Constitutional: He is oriented to person, place, and time. No distress.  Frail and cachectic  HENT:  Head: Normocephalic and atraumatic.  Right Ear: External ear normal.  Left Ear: External ear normal.  Mouth/Throat: Oropharynx is clear and moist. No oropharyngeal exudate.  Use of accessory muscles present Beard +  Eyes: Pupils are equal, round, and reactive to light. Conjunctivae and EOM are normal. Right eye exhibits no discharge. Left eye exhibits no discharge. No scleral icterus.  Neck: Normal range of motion. Neck supple. No JVD present. No tracheal deviation present. No thyromegaly  present.  Cardiovascular: Normal rate, regular rhythm and intact distal pulses.  Exam reveals no gallop and no friction rub.   No murmur heard. Pulmonary/Chest: Effort normal and breath sounds normal. No respiratory distress. He has no wheezes. He has no rales. He exhibits no tenderness.  Able to talk full sentences Baseline purse lip breathing Oxygen on Significant retraction of intercostal muscles which is baseline barrrell chest   Abdominal: Soft. Bowel sounds are normal. He exhibits no distension and no mass. There is no tenderness. There is no rebound and no guarding.  Scaphoid abdomen  Musculoskeletal: Normal range of motion. He exhibits no edema or tenderness.  Lymphadenopathy:    He has no cervical adenopathy.  Neurological: He is alert and oriented to person, place, and time. He has normal reflexes. No cranial nerve deficit. Coordination normal.  Skin: Skin is warm and dry. No rash noted. He is not diaphoretic. No erythema. No pallor.  Psychiatric: He has a normal mood and affect. His behavior is normal. Judgment and thought content normal.  Nursing note and vitals reviewed.  Vitals:   07/16/17 0902  BP: (!) 90/52  Pulse: 92  SpO2: 93%  Weight: 120 lb 3.2 oz (54.5 kg)  Height: 5\' 7"  (1.702 m)  Estimated body mass index is 18.83 kg/m as calculated from the following:   Height as of this encounter: 5\' 7"  (1.702 m).   Weight as of this encounter: 120 lb 3.2 oz (54.5 kg).     Assessment:       ICD-10-CM   1. Chest tightness R07.89   2. Stage 4 very severe COPD by GOLD classification ([place]) J44.9   3. History of pleural effusion Z87.09   4. Chronic respiratory failure with hypoxia (HCC) J96.11        Plan:      Need to sort out if loculated pleural effusion is worse or if there is a recurrence and pleural effusion. If it is free flowing that he could benefit from a thoracentesis. In addition for his COPD being so end-stage if he is hypercapnic he might benefit from  nocturnal BiPAP therefore we will get an ABG. I've told him that the results of these should be discussed at follow-up. Wife is agreeable with the plan  PLAN  Do ct chest wo contrast Do ABG  Return to see Dr Halford Chessman or an APP after tests next few days to few weeks At followup to discuss any possible benefit of nocturnal bipap if you are a co2 retainer    Dr. Brand Males, M.D., [facility].C.P Pulmonary and Critical Care Medicine Staff Physician [facility] Pager: [phone], If no answer or between  15:00h - 7:00h: call [phone]  07/16/2017 9:20 AM

## 2017-07-16 NOTE — Patient Instructions (Signed)
ICD-10-CM   1. Chest tightness R07.89   2. 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 History of pleural effusion Z87.09   4. Chronic respiratory failure with hypoxia (HCC) J96.11     Do ct chest wo contrast Do ABG  Return to see Dr Halford Chessman or an APP after tests next few days to few weeks At followup to discuss any possible benefit of nocturnal bipap if you are a co2 retainer

## 2017-07-17 ENCOUNTER — Ambulatory Visit (HOSPITAL_COMMUNITY)
Admission: RE | Admit: 2017-07-17 | Discharge: 2017-07-17 | Disposition: A | Discharge status: Home / Self Care | Payer: Medicare Other | Source: Ambulatory Visit | Attending: Internal Medicine | Admitting: Internal Medicine

## 2017-07-17 ENCOUNTER — Encounter (HOSPITAL_COMMUNITY)
Admission: RE | Admit: 2017-07-17 | Discharge: 2017-07-17 | Disposition: A | Discharge status: Home / Self Care | Payer: Self-pay | Source: Ambulatory Visit | Attending: Pulmonary Disease | Admitting: Pulmonary Disease

## 2017-07-17 DIAGNOSIS — R0789 Other chest pain: Secondary | ICD-10-CM

## 2017-07-17 DIAGNOSIS — J9611 Chronic respiratory failure with hypoxia: Secondary | ICD-10-CM | POA: Diagnosis present

## 2017-07-17 LAB — BLOOD GAS, ARTERIAL
Acid-Base Excess: 6.3 mmol/L — ABNORMAL HIGH (ref 0.0–2.0)
BICARBONATE: 30.4 mmol/L — AB (ref 20.0–28.0)
Drawn by: 331001
O2 Content: 2 L/min
O2 Saturation: 93 %
PATIENT TEMPERATURE: 98.6
PO2 ART: 65 mmHg — AB (ref 83.0–108.0)
pCO2 arterial: 45 mmHg (ref 32.0–48.0)
pH, Arterial: 7.445 (ref 7.350–7.450)

## 2017-07-18 ENCOUNTER — Telehealth: Payer: Self-pay | Admitting: Internal Medicine

## 2017-07-18 NOTE — Telephone Encounter (Signed)
lmtcb X1 for pt. Routing to EE as this result message was generated by MR.

## 2017-07-18 NOTE — Telephone Encounter (Signed)
LEt Malena Peer know that  - (sending to triage because Sood patient I saw acutely)  ABGG  Does NOT show hypercapnia - so no role for bipap for him at night  CT does show SOME free flowing fluid - so there might be something  to tap. However, CT shows allslos  chronic scarring from his previous effusion and asestos disease and that is why he is feeling what he is feleling.And many other findings.   He should keep appt with APP 08/06/17 - so he can discus  THanks  Dr. Brand Males, M.D., [facility].C.P Pulmonary and Critical Care Medicine Staff Physician [place] [facility] Pager: [phone], If no answer or between  15:00h - 7:00h: call [phone]  07/18/2017 3:57 PM     Recent Labs Lab 07/17/17 0855  PHART 7.445  PCO2ART 45.0  PO2ART 65.0*  HCO3 30.4*  O2SAT 93.0   Dg Chest 2 View  Result Date: 07/15/2017 CLINICAL DATA:  Shortness of breath, recurrent pleural effusion EXAM: CHEST  2 VIEW COMPARISON:  06/10/2017 FINDINGS: Hyperinflation with emphysematous changes. Small right pleural effusion with basilar scarring. Small moderate left pleural effusion, likely loculated, unchanged in appearance. Left basilar atelectasis or pneumonia. Somewhat nodular appearing left lower lung zone opacity. Stable cardiomediastinal silhouette. Deformity/fracture of the mid left clavicle. IMPRESSION: 1. Small right pleural effusion slight increased from prior with scarring at the right base. Hyperinflation with emphysematous change 2. Small moderate left pleural effusion, without significant change, probably loculated. Nodular opacity in the left lower lung zone, also not significantly changed but cannot exclude lung mass. Recommend CT for further evaluation. 3. Left basilar consolidations may be due to atelectasis or pneumonia. These results will be called to the ordering clinician or representative by the Radiologist Assistant, and communication documented in the  PACS or zVision Dashboard. Electronically Signed   By: Donavan Foil M.D.   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 Increasing shortness of breath and chest tightness. EXAM: CT CHEST WITHOUT CONTRAST TECHNIQUE: Multidetector CT imaging of the chest was performed following the standard protocol without IV contrast. COMPARISON:  No priors. FINDINGS: Cardiovascular: Heart size is mildly enlarged. There is no significant pericardial fluid, thickening or pericardial calcification. There is aortic atherosclerosis, as well as atherosclerosis of the great vessels of the mediastinum and the coronary arteries, including calcified atherosclerotic plaque in the left main, left anterior descending, left circumflex and right coronary arteries. Dilatation of the pulmonic trunk (4 cm in diameter). Mediastinum/Nodes: No pathologically enlarged mediastinal or hilar lymph nodes. Please note that accurate exclusion of hilar adenopathy is limited on noncontrast CT scans. Esophagus is unremarkable in appearance. No axillary lymphadenopathy. Lungs/Pleura: Left hemithorax is diminutive in size. Extensive pleural thickening calcification in the left hemithorax. Small amount of pleural thickening calcification in the inferior aspect of the right hemithorax as well. Small to moderate-sized pleural effusion in the lower left hemithorax. Extensive passive atelectasis in the lungs, particularly in the left upper and lower lobes where there are mass-like areas of peripheral volume loss which are strongly favored to represent areas of rounded atelectasis. Similar findings are present to a lesser extent in the periphery of the right lower lobe as well. 1.0 x 0.8 cm (mean diameter of 0.9 cm) nodule in the right lower lobe (axial image 110 of series 3). Several other  scattered pulmonary nodules are noted throughout the right lung,  largest of which is in the periphery of the right upper lobe (axial image 43 of series 3) measuring 10 x 4 mm (mean diameter of 7 mm). Diffuse bronchial wall thickening with moderate centrilobular and paraseptal emphysema. Upper Abdomen: 5 mm nonobstructive calculus in the upper pole collecting system of left kidney. Aortic atherosclerosis. Musculoskeletal: There are no aggressive appearing lytic or blastic lesions noted in the visualized portions of the skeleton. IMPRESSION: 1. Diffuse bronchial wall thickening with moderate centrilobular and paraseptal emphysema; imaging findings compatible with the reported clinical history of COPD. 2. 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 3. 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 There is an additional area of probable rounded atelectasis in the posterior aspect of the right lower lobe which appears to be developing. 4. Dilatation of the pulmonic trunk (4 cm in diameter), suggestive of pulmonary arterial hypertension. 5. Multiple pulmonary nodules in the lungs bilaterally, the largest of which is in the right lower lobe measuring up to a mean diameter of 9 mm. Non-contrast chest CT at 3-6 months is recommended. If the nodules are stable at time of repeat CT, then future CT at 18-24 months (from today's scan) is considered optional for low-risk patients, but is recommended for high-risk patients. This recommendation follows the consensus statement: Guidelines for Management of Incidental Pulmonary Nodules Detected on CT Images: From the Fleischner Society 2017; Radiology 2017; 284:228-243. 6. Aortic atherosclerosis, in addition to left main and 3 vessel coronary artery disease. Please note that although the presence of coronary artery calcium documents the presence of  coronary artery disease, the severity of this disease and any potential stenosis cannot be assessed on this non-gated CT examination. Assessment for potential risk factor modification, dietary therapy or pharmacologic therapy may be warranted, if clinically indicated. Aortic Atherosclerosis (ICD10-I70.0) and Emphysema (ICD10-J43.9). Electronically Signed   By: Vinnie Langton M.D.   On: 07/17/2017 10:42

## 2017-07-21 ENCOUNTER — Telehealth: Payer: Self-pay

## 2017-07-21 NOTE — Telephone Encounter (Signed)
Notes sent to scheduling

## 2017-07-21 NOTE — Telephone Encounter (Signed)
Pt returning call for results and can be reached @ [phone].Jose Mckinney

## 2017-07-22 ENCOUNTER — Encounter (HOSPITAL_COMMUNITY): Admission: RE | Admit: 2017-07-22 | Payer: Self-pay | Source: Ambulatory Visit

## 2017-07-22 NOTE — Telephone Encounter (Signed)
Pt wife calling again for results and can be reached @ same #.Jose Mckinney

## 2017-07-22 NOTE — Telephone Encounter (Signed)
Pt wife said that husband would be leaving soon and that you can call her @ [phone].Jose Mckinney

## 2017-07-24 ENCOUNTER — Encounter (HOSPITAL_COMMUNITY): Payer: Self-pay

## 2017-07-24 DIAGNOSIS — J439 Emphysema, unspecified: Secondary | ICD-10-CM | POA: Insufficient documentation

## 2017-07-24 NOTE — Telephone Encounter (Signed)
Results have been explained to patient, pt expressed understanding.  Pt to follow up 08/06/17 with NP.   Nothing further needed.

## 2017-07-29 ENCOUNTER — Encounter (HOSPITAL_COMMUNITY): Payer: Medicare Other

## 2017-07-29 ENCOUNTER — Encounter (HOSPITAL_COMMUNITY): Payer: Self-pay

## 2017-07-31 ENCOUNTER — Encounter (HOSPITAL_COMMUNITY): Payer: Self-pay

## 2017-08-05 ENCOUNTER — Encounter (HOSPITAL_COMMUNITY)
Admission: RE | Admit: 2017-08-05 | Discharge: 2017-08-05 | Disposition: A | Discharge status: Home / Self Care | Payer: Self-pay | Source: Ambulatory Visit | Attending: Pulmonary Disease | Admitting: Pulmonary Disease

## 2017-08-06 ENCOUNTER — Encounter: Payer: Self-pay | Admitting: Pulmonary Disease

## 2017-08-06 ENCOUNTER — Ambulatory Visit (INDEPENDENT_AMBULATORY_CARE_PROVIDER_SITE_OTHER): Payer: Medicare Other | Admitting: Pulmonary Disease

## 2017-08-06 VITALS — BP 106/62 | HR 80 | Ht 69.0 in | Wt 119.8 lb

## 2017-08-06 DIAGNOSIS — J449 Chronic obstructive pulmonary disease, unspecified: Secondary | ICD-10-CM

## 2017-08-06 DIAGNOSIS — J941 Fibrothorax: Secondary | ICD-10-CM | POA: Diagnosis not present

## 2017-08-06 DIAGNOSIS — J920 Pleural plaque with presence of asbestos: Secondary | ICD-10-CM | POA: Diagnosis not present

## 2017-08-06 DIAGNOSIS — J432 Centrilobular emphysema: Secondary | ICD-10-CM | POA: Diagnosis not present

## 2017-08-06 DIAGNOSIS — J90 Pleural effusion, not elsewhere classified: Secondary | ICD-10-CM | POA: Diagnosis not present

## 2017-08-06 NOTE — Progress Notes (Signed)
[facility]   Chief Complaint  Patient presents with  . Follow-up    review CT results. pt reports of prod cough prod cough with white mucus, sob with exertion & chest tightness and discomfort into back.     Primary Pulmonologist: Dr. Halford Chessman  Current Outpatient Prescriptions on File Prior to Visit  Medication Sig  . albuterol (PROVENTIL) (2.5 MG/3ML) 0.083% nebulizer solution Take 3 mLs (2.5 mg total) by nebulization every 6 (six) hours as needed for wheezing or shortness of breath.  . feeding supplement (BOOST HIGH PROTEIN) LIQD Take 1 Container by mouth daily.   . fluticasone (FLONASE) 50 MCG/ACT nasal spray Place 2 sprays into both nostrils daily as needed for allergies or rhinitis.  Marland Kitchen ipratropium (ATROVENT) 0.02 % nebulizer solution Take 2.5 mLs (0.5 mg total) by nebulization 4 (four) times daily.  . sertraline (ZOLOFT) 50 MG tablet Take 50 mg by mouth 2 (two) times daily.  . sildenafil (REVATIO) 20 MG tablet Take 1 tablet (20 mg total) by mouth 3 (three) times daily.  Marland Kitchen tiotropium (SPIRIVA HANDIHALER) 18 MCG inhalation capsule Place 1 capsule (18 mcg total) into inhaler and inhale daily.  Marland Kitchen torsemide (DEMADEX) 20 MG tablet Take 1 tablet (20 mg total) by mouth as directed. Take whole tablet every Mon, Wed and Fri's; take 1/2 tablet all other days   No current facility-administered medications on file prior to visit.      Studies: CT chest 05/30/14 >> loculated Lt effusion, free flowing Rt effusion Echo 06/09/14 >> EF 60 to 62%, grade 1 diastolic dysfx, mild MR  Past Medical Hx:  has a past medical history of Anxiety; CHF (congestive heart failure) ([place]); COPD with emphysema ([place]); Depression; HTN (hypertension); Pulmonary HTN ([place]); Recurrent left pleural effusion; Sciatic leg pain; and Seasonal allergies.   Past Surgical hx, Allergies, Family hx, Social hx all reviewed.  Vital Signs BP 106/62 (BP Location: Left Arm, Cuff Size: Normal)   Pulse 80   Ht 5\' 9"  (1.753 m)    Wt 119 lb 12.8 oz (54.3 kg)   SpO2 93%   BMI 17.69 kg/m   History of Present Illness Jose Mckinney is a 71 y.o. male with a history of heavy tobacco abuse, weight loss, severe COPD and pleural effusion who presented to the pulmonary office for follow up on CT scan and ABG.    Jose Mckinney was seen by Dr. Chase Caller 7/25 with plan for CT evaluation to review effusion and ABG to assess for hypercarbia.  ABG 7/26  7.445 / 45 / 65 / 30.  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    COPD - albuterol PRN, atrovent QID, spiriva Cor Pulmonale - revatio, occasional dizziness when standing too soon, sleeps sitting up in recliner.  Tolerating torsemide M,W,F full pill > 1/2 pill other days Chronic Hypoxic Respiratory Failure - 2L   Jose Mckinney reports miniminal appetite, approximate 45 lb weight loss over the last 2 years but is holding now at 119 lbs, weighed 126lbs in 2015.  Jose Mckinney quit smoking 2105 > up to 3ppd, quit with two expired nicotine patches.  Jose Mckinney has never been intubated.  Jose Mckinney is on 2L O2 at baseline.  The patient reports Jose Mckinney has  been participating in pulmonary rehab and is now in "maintenence" where Jose Mckinney goes 2x per week.    Currently, Jose Mckinney reports pleuritic chest pain on the left and shortness of breath. Jose Mckinney feels like his chest is "filling up".  Jose Mckinney reports Jose Mckinney has had prior thora x3 in [place] but was never told why the fluid was there.  CT chest in 2016 (images not available) showed left lower loculated pleural fluid with pleural thickening.     Physical Exam  General - cachectic adult M in no acute distress ENT - No sinus  tenderness, no oral exudate, no LAN Cardiac - s1s2 regular, no murmur Chest - even/non-labored, diminished on L, clear on R. No wheeze/rales Back - No focal tenderness Abd - Soft, non-tender Ext - No edema Neuro - Normal strength Skin - No rashes Psych - normal mood, and behavior   Assessment/Plan  Discussion:  71 y/o M, former heavy smoker, with chronic loculated pleural effusion.  Repeat CT chest shows pleural plaques concerning for asbestos exposure.  No mention of such on prior CT read.  Given constellation of symptoms (weight loss, asbestos plaques, former heavy smoking & loculated effusion), situation is worrisome for malignancy vs benign asbestos related effusion.  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     Plan: Arrange for left thoracentesis for fluid analysis  Follow up pleural fluid, pending results may need referral to CVTS Doubt autoimmune process but included in differential Follow up with Dr. Halford Chessman in two months Continue prior home medications and oxygen as arranged    Patient Instructions  1.  We will refer you for thoracentesis (drawing the fluid out of your pleural space) 2.  We will call you with fluid results.  Pending review, you may need a referral to our chest surgeons for review.  3.  Call if you have new or worsening symptoms. 4.  Follow up with Dr. Halford Chessman in one to two months 5.  Continue your medications and oxygen as prescribed.  Continue your toresemide as prescribed.      [name], NP-C [facility]  [phone] 08/06/2017, 6:57 PM

## 2017-08-06 NOTE — Patient Instructions (Addendum)
1.  We will refer you for thoracentesis (drawing the fluid out of your pleural space) 2.  We will call you with fluid results.  Pending review, you may need a referral to our chest surgeons for review.  3.  Call if you have new or worsening symptoms. 4.  Follow up with Dr. Halford Chessman in one to two months 5.  Continue your medications and oxygen as prescribed.  Continue your toresemide as prescribed.

## 2017-08-07 ENCOUNTER — Encounter (HOSPITAL_COMMUNITY): Payer: Self-pay

## 2017-08-08 ENCOUNTER — Ambulatory Visit (HOSPITAL_COMMUNITY)
Admission: RE | Admit: 2017-08-08 | Discharge: 2017-08-08 | Disposition: A | Discharge status: Home / Self Care | Payer: Medicare Other | Source: Ambulatory Visit | Attending: Pulmonary Disease | Admitting: Pulmonary Disease

## 2017-08-08 ENCOUNTER — Encounter (HOSPITAL_COMMUNITY): Payer: Self-pay | Admitting: Interventional Radiology

## 2017-08-08 ENCOUNTER — Telehealth: Payer: Self-pay | Admitting: Pulmonary Disease

## 2017-08-08 ENCOUNTER — Ambulatory Visit (HOSPITAL_COMMUNITY)
Admission: RE | Admit: 2017-08-08 | Discharge: 2017-08-08 | Disposition: A | Discharge status: Home / Self Care | Payer: Medicare Other | Source: Ambulatory Visit | Attending: General Surgery | Admitting: General Surgery

## 2017-08-08 DIAGNOSIS — I700 Atherosclerosis of aorta: Secondary | ICD-10-CM | POA: Diagnosis not present

## 2017-08-08 DIAGNOSIS — J90 Pleural effusion, not elsewhere classified: Secondary | ICD-10-CM | POA: Diagnosis not present

## 2017-08-08 DIAGNOSIS — J920 Pleural plaque with presence of asbestos: Secondary | ICD-10-CM

## 2017-08-08 DIAGNOSIS — Z9889 Other specified postprocedural states: Secondary | ICD-10-CM

## 2017-08-08 HISTORY — PX: IR THORACENTESIS ASP PLEURAL SPACE W/IMG GUIDE: IMG5380

## 2017-08-08 LAB — BODY FLUID CELL COUNT WITH DIFFERENTIAL
Eos, Fluid: 2 %
LYMPHS FL: 94 %
Monocyte-Macrophage-Serous Fluid: 4 % — ABNORMAL LOW (ref 50–90)
WBC FLUID: 53 uL (ref 0–1000)

## 2017-08-08 LAB — GLUCOSE, PLEURAL OR PERITONEAL FLUID: Glucose, Fluid: 55 mg/dL

## 2017-08-08 LAB — LACTATE DEHYDROGENASE, PLEURAL OR PERITONEAL FLUID: LD, Fluid: 138 U/L — ABNORMAL HIGH (ref 3–23)

## 2017-08-08 LAB — PROTEIN, PLEURAL OR PERITONEAL FLUID

## 2017-08-08 MED ORDER — LIDOCAINE HCL (PF) 1 % IJ SOLN
INTRAMUSCULAR | Status: AC
  Filled 2017-08-08: qty 30

## 2017-08-08 NOTE — Telephone Encounter (Signed)
Called by IR [name], PA-C) regarding thoracentesis.  Radiology concerned that he will be left with an ex-vacuo pneumothorax.  He has bilateral pulmonary nodules, weight loss and heavy smoking history.  From prior chart review, I am unable to tell if he has had intervention in the left hemithorax (?pleurodesis).  He does state that he has had at least 3 thoracentesis in the past.  Hx of asbestos exposures in the past.  Picture worrisome for possible malignancy.  Will review PET CT to ensure no hypermetabolic activity.    Jose Gens, NP-C [facility] Pgr: [phone] or if no answer [phone] 08/08/2017, 2:52 PM

## 2017-08-09 LAB — GRAM STAIN

## 2017-08-10 LAB — ACID FAST SMEAR (AFB, MYCOBACTERIA): Acid Fast Smear: NEGATIVE

## 2017-08-10 NOTE — Progress Notes (Signed)
I have reviewed and agree with assessment/plan.  Chesley Mires, MD [facility] Pulmonary/Critical Care 08/10/2017, 11:03 AM Pager:  [phone]

## 2017-08-11 ENCOUNTER — Telehealth: Payer: Self-pay | Admitting: Pulmonary Disease

## 2017-08-11 ENCOUNTER — Other Ambulatory Visit: Payer: Self-pay | Admitting: Pulmonary Disease

## 2017-08-11 DIAGNOSIS — J90 Pleural effusion, not elsewhere classified: Secondary | ICD-10-CM

## 2017-08-11 NOTE — Telephone Encounter (Signed)
PET scan ordered with hx of pleural effusion, weight loss, heavy smoking, concern for asbestos plaques on CT  [name], NP-C [facility] Pgr: [phone] or if no answer [phone] 08/11/2017, 10:45 AM

## 2017-08-11 NOTE — Telephone Encounter (Signed)
lmtcb x1 for pt. 

## 2017-08-12 ENCOUNTER — Encounter (HOSPITAL_COMMUNITY)
Admission: RE | Admit: 2017-08-12 | Discharge: 2017-08-12 | Disposition: A | Discharge status: Home / Self Care | Payer: Self-pay | Source: Ambulatory Visit | Attending: Pulmonary Disease | Admitting: Pulmonary Disease

## 2017-08-12 ENCOUNTER — Telehealth: Payer: Self-pay | Admitting: Pulmonary Disease

## 2017-08-12 LAB — PH, BODY FLUID: PH, BODY FLUID: 8

## 2017-08-12 NOTE — Telephone Encounter (Signed)
Message left with Devontre to call back regarding pleural fluid and need for PET CT scan.  He apparently called the office with questions regarding the PET CT.  Pleural fluid is negative for malignant cells, lymphoid predominant cells.  However, given his weight loss and pleural plaques, feel it would be best to obtain a PET CT to ensure no hypermetabolic activity (malignancy).  Attempted to call patient and wife's number with no answer.  Will try to call back to discuss plan of care.   [name], NP-C [facility] Pgr: [phone] or if no answer [phone] 08/12/2017, 8:21 AM

## 2017-08-12 NOTE — Telephone Encounter (Signed)
Will you please call Jose Mckinney and let him know that we want to assess the PET scan to better look at his pleural fluid and pulmonary nodules. He will need a follow up appointment with Dr. Halford Chessman if he does not already have one. Thanks!!     lmomtcb x 2 for the pt.

## 2017-08-12 NOTE — Telephone Encounter (Signed)
Patient returned call, CB is [phone] or [phone].

## 2017-08-12 NOTE — Telephone Encounter (Signed)
Spoke with pt. He is aware of why he needs the PET scan. Pt has an appointment with TP next month. Nothing further was needed.

## 2017-08-13 LAB — CULTURE, BODY FLUID W GRAM STAIN -BOTTLE: Culture: NO GROWTH

## 2017-08-13 LAB — CULTURE, BODY FLUID-BOTTLE

## 2017-08-14 ENCOUNTER — Encounter (HOSPITAL_COMMUNITY): Admission: RE | Admit: 2017-08-14 | Payer: Self-pay | Source: Ambulatory Visit

## 2017-08-15 ENCOUNTER — Telehealth: Payer: Self-pay | Admitting: Pulmonary Disease

## 2017-08-15 NOTE — Telephone Encounter (Signed)
Called Mr. Reach again regarding possible PET scan evaluation.  No answer.  Message left for him to call the office back and we will discuss with him.     [name], NP-C [facility] Pgr: [phone] or if no answer [phone] 08/15/2017, 2:02 PM

## 2017-08-15 NOTE — Telephone Encounter (Signed)
I spoke with Jose Mckinney and answered his questions regarding the reasoning for the exam.  He indicates understanding but wants to know how much the procedure is going to cost before proceeding.  Can we please set him up for the exam and determine cost?  Thanks,  [name], NP-C [facility] Pgr: [phone] or if no answer [phone] 08/15/2017, 4:24 PM

## 2017-08-15 NOTE — Telephone Encounter (Signed)
Brandi attempted to contact the patient today. The patient is very frustrated, he does not want to speak to anyone in triage and is requesting that [place] call him asap to discuss the PET she wants him to have. I advised that I would reach out to [facility] and she will hopefully be able to contact him back today.   ATC Brandi, no answer Text sent letting her know that the patient is requesting a call.   Will await message back from [employer].   Donita Brooks, NP  Note  08/15/17 2:01 PM  Called Jose Mckinney again regarding possible PET scan evaluation.  No answer.  Message left for him to call the office back and we will discuss with him.

## 2017-08-15 NOTE — Telephone Encounter (Signed)
Called back by Mr. Val.  He reports he wants to know how much the PET-CT is going to cost before moving forward.  In addition, he states he is having increased pain from thoracentesis and has been using ibuprofen for pain.  Reviewed reasoning for imaging with patient and concerns for possible malignancy.  Will ask office to review for scheduling and potential costs.  Encouraged patient to let the office know if the pain does not improve.   [name], NP-C [facility] Pgr: [phone] or if no answer [phone] 08/15/2017, 4:14 PM

## 2017-08-18 ENCOUNTER — Ambulatory Visit (HOSPITAL_COMMUNITY): Payer: Medicare Other

## 2017-08-18 NOTE — Telephone Encounter (Signed)
Spoke to pt had to explain the pet he was not sure how it is done also told him his medicare should pay 80% and his aarp should pick up the 20% if he has met his deductable he was ok with these explainations and we scheduled his pet for 08/27/17@1pm  Joellen [place]

## 2017-08-19 ENCOUNTER — Encounter (HOSPITAL_COMMUNITY): Payer: Self-pay

## 2017-08-21 ENCOUNTER — Encounter (HOSPITAL_COMMUNITY): Payer: Self-pay

## 2017-08-26 ENCOUNTER — Encounter (HOSPITAL_COMMUNITY): Admission: RE | Admit: 2017-08-26 | Payer: Self-pay | Source: Ambulatory Visit

## 2017-08-27 ENCOUNTER — Ambulatory Visit (HOSPITAL_COMMUNITY)
Admission: RE | Admit: 2017-08-27 | Discharge: 2017-08-27 | Disposition: A | Discharge status: Home / Self Care | Payer: Medicare Other | Source: Ambulatory Visit | Attending: Pulmonary Disease | Admitting: Pulmonary Disease

## 2017-08-27 DIAGNOSIS — C3481 Malignant neoplasm of overlapping sites of right bronchus and lung: Secondary | ICD-10-CM | POA: Insufficient documentation

## 2017-08-27 DIAGNOSIS — C3482 Malignant neoplasm of overlapping sites of left bronchus and lung: Secondary | ICD-10-CM | POA: Diagnosis not present

## 2017-08-27 DIAGNOSIS — J90 Pleural effusion, not elsewhere classified: Secondary | ICD-10-CM | POA: Diagnosis present

## 2017-08-27 DIAGNOSIS — C799 Secondary malignant neoplasm of unspecified site: Secondary | ICD-10-CM | POA: Insufficient documentation

## 2017-08-27 DIAGNOSIS — C779 Secondary and unspecified malignant neoplasm of lymph node, unspecified: Secondary | ICD-10-CM | POA: Diagnosis not present

## 2017-08-27 LAB — GLUCOSE, CAPILLARY: Glucose-Capillary: 106 mg/dL — ABNORMAL HIGH (ref 65–99)

## 2017-08-27 MED ORDER — FLUDEOXYGLUCOSE F - 18 (FDG) INJECTION
6.4000 | Freq: Once | INTRAVENOUS | Status: AC
  Administered 2017-08-27: 6.4 via INTRAVENOUS

## 2017-08-28 ENCOUNTER — Telehealth: Payer: Self-pay | Admitting: Pulmonary Disease

## 2017-08-28 ENCOUNTER — Encounter (HOSPITAL_COMMUNITY): Payer: Self-pay

## 2017-08-28 DIAGNOSIS — J439 Emphysema, unspecified: Secondary | ICD-10-CM | POA: Insufficient documentation

## 2017-08-28 DIAGNOSIS — R591 Generalized enlarged lymph nodes: Secondary | ICD-10-CM

## 2017-08-28 NOTE — Telephone Encounter (Signed)
Called Mr. Macdowell to review PET CT findings as below.  No answer - left message for patient to return call and I will discuss results with patient.     1. Widespread malignancy, including within nodal stations, bones, left pleural space, and lungs. Differential considerations include metastatic disease (possibly lung or pleura) or lymphoproliferative process such as lymphoma. 2. Coronary artery atherosclerosis. Aortic Atherosclerosis (ICD10-I70.0). 3. Asbestos related pleural disease.   [name], NP-C [facility] Pgr: [phone] or if no answer [phone] 08/28/2017, 4:30 PM

## 2017-08-29 ENCOUNTER — Other Ambulatory Visit: Payer: Self-pay

## 2017-08-29 DIAGNOSIS — I2781 Cor pulmonale (chronic): Secondary | ICD-10-CM

## 2017-08-29 NOTE — Addendum Note (Signed)
Addended by: Donita Brooks on: 08/29/2017 08:54 AM   Modules accepted: Orders

## 2017-08-29 NOTE — Telephone Encounter (Signed)
Jose Mckinney called A renal biopsy was scheduled but patient DOES NOT need this procedure - pt needs a CT guided supraclavicular node biopsy done by IR.  Jose Mckinney will have the renal biopsy cancelled and the correct biopsy ordered Advised Jose Mckinney will text/page her once this has been done Order given to [place] to assist in canceling the incorrect order and placing the correct one

## 2017-08-29 NOTE — Telephone Encounter (Signed)
Looks like renal bx was already cancelled  The pt is currently scheduled for the following:  Jose Mckinney,Jose Mckinney MRN: [id number]   Date: 09/04/2017 Status: Sch  Time: 11:00 AM Length: 90  Visit Type: PRE PROCEDURE 90 [537] Copay: $0.00  Provider: [facility] ROOM Department: [facility] DAY CARE  Referring Provider: [name] CSN: [id number]  Notes: [place] Bx/ Dx: Left Supraclavicular Lymph Node/ Vanessa Barbara  Made On: 08/29/2017 1:18 PM By: Rayburn Ma D  Patient Demographics for BPJPE,[id number] [[id number]]    Nice for Toni at [phone] to discuss and see why they changed out CT guided order and changed to US guided Will hold in triage to ensure this gets handled

## 2017-08-29 NOTE — Telephone Encounter (Signed)
Spoke with Jose Mckinney and his wife Jose Mckinney via phone. Informed them of PET CT results.  Reviewed that the CT is concerning for malignancy that has spread to multiple sites.  He does have a left supraclavicular node that would be amenable to biopsy.    Plan: Referral to [facility] IR for left supraclavicular node biopsy Referral to ONC for follow up evaluation  Supportive care offered Will follow up with patient once pathology returned   [name], NP-C [place] Pgr: [phone] or if no answer [phone] 08/29/2017, 8:51 AM

## 2017-09-02 ENCOUNTER — Encounter (HOSPITAL_COMMUNITY): Payer: Self-pay

## 2017-09-03 ENCOUNTER — Telehealth: Payer: Self-pay | Admitting: Hematology and Oncology

## 2017-09-03 ENCOUNTER — Ambulatory Visit: Payer: Medicare Other | Admitting: Adult Health

## 2017-09-03 ENCOUNTER — Encounter: Payer: Self-pay | Admitting: Hematology and Oncology

## 2017-09-03 NOTE — Telephone Encounter (Signed)
Appt has been scheduled for the pt to see Dr. Lebron Conners on 9/20 at 2pm Aware to arrive 30 minutes early. Letter mailed.

## 2017-09-04 ENCOUNTER — Other Ambulatory Visit: Payer: Self-pay | Admitting: Pulmonary Disease

## 2017-09-04 ENCOUNTER — Ambulatory Visit (HOSPITAL_COMMUNITY)
Admission: RE | Admit: 2017-09-04 | Discharge: 2017-09-04 | Disposition: A | Discharge status: Home / Self Care | Payer: Medicare Other | Source: Ambulatory Visit | Attending: Pulmonary Disease | Admitting: Pulmonary Disease

## 2017-09-04 ENCOUNTER — Encounter (HOSPITAL_COMMUNITY): Payer: Self-pay

## 2017-09-04 DIAGNOSIS — I272 Pulmonary hypertension, unspecified: Secondary | ICD-10-CM | POA: Insufficient documentation

## 2017-09-04 DIAGNOSIS — I110 Hypertensive heart disease with heart failure: Secondary | ICD-10-CM | POA: Insufficient documentation

## 2017-09-04 DIAGNOSIS — Z79899 Other long term (current) drug therapy: Secondary | ICD-10-CM | POA: Diagnosis not present

## 2017-09-04 DIAGNOSIS — I2781 Cor pulmonale (chronic): Secondary | ICD-10-CM

## 2017-09-04 DIAGNOSIS — I509 Heart failure, unspecified: Secondary | ICD-10-CM | POA: Diagnosis not present

## 2017-09-04 DIAGNOSIS — Z9981 Dependence on supplemental oxygen: Secondary | ICD-10-CM | POA: Insufficient documentation

## 2017-09-04 DIAGNOSIS — F419 Anxiety disorder, unspecified: Secondary | ICD-10-CM | POA: Diagnosis not present

## 2017-09-04 DIAGNOSIS — J449 Chronic obstructive pulmonary disease, unspecified: Secondary | ICD-10-CM | POA: Insufficient documentation

## 2017-09-04 DIAGNOSIS — C770 Secondary and unspecified malignant neoplasm of lymph nodes of head, face and neck: Secondary | ICD-10-CM | POA: Diagnosis not present

## 2017-09-04 DIAGNOSIS — Z9889 Other specified postprocedural states: Secondary | ICD-10-CM | POA: Insufficient documentation

## 2017-09-04 DIAGNOSIS — Z791 Long term (current) use of non-steroidal anti-inflammatories (NSAID): Secondary | ICD-10-CM | POA: Insufficient documentation

## 2017-09-04 DIAGNOSIS — F329 Major depressive disorder, single episode, unspecified: Secondary | ICD-10-CM | POA: Insufficient documentation

## 2017-09-04 DIAGNOSIS — R590 Localized enlarged lymph nodes: Secondary | ICD-10-CM | POA: Insufficient documentation

## 2017-09-04 HISTORY — DX: Dyspnea, unspecified: R06.00

## 2017-09-04 HISTORY — DX: Dependence on supplemental oxygen: Z99.81

## 2017-09-04 LAB — CBC WITH DIFFERENTIAL/PLATELET
Basophils Absolute: 0 10*3/uL (ref 0.0–0.1)
Basophils Relative: 0 %
EOS ABS: 0.2 10*3/uL (ref 0.0–0.7)
EOS PCT: 2 %
HCT: 36.9 % — ABNORMAL LOW (ref 39.0–52.0)
Hemoglobin: 12.1 g/dL — ABNORMAL LOW (ref 13.0–17.0)
LYMPHS ABS: 1.8 10*3/uL (ref 0.7–4.0)
Lymphocytes Relative: 23 %
MCH: 30.2 pg (ref 26.0–34.0)
MCHC: 32.8 g/dL (ref 30.0–36.0)
MCV: 92 fL (ref 78.0–100.0)
MONOS PCT: 12 %
Monocytes Absolute: 0.9 10*3/uL (ref 0.1–1.0)
Neutro Abs: 4.8 10*3/uL (ref 1.7–7.7)
Neutrophils Relative %: 63 %
PLATELETS: 260 10*3/uL (ref 150–400)
RBC: 4.01 MIL/uL — ABNORMAL LOW (ref 4.22–5.81)
RDW: 13.2 % (ref 11.5–15.5)
WBC: 7.7 10*3/uL (ref 4.0–10.5)

## 2017-09-04 LAB — PROTIME-INR
INR: 1.01
PROTHROMBIN TIME: 13.2 s (ref 11.4–15.2)

## 2017-09-04 LAB — BASIC METABOLIC PANEL
Anion gap: 11 (ref 5–15)
BUN: 20 mg/dL (ref 6–20)
CO2: 34 mmol/L — AB (ref 22–32)
CREATININE: 0.89 mg/dL (ref 0.61–1.24)
Calcium: 9.9 mg/dL (ref 8.9–10.3)
Chloride: 95 mmol/L — ABNORMAL LOW (ref 101–111)
GFR calc Af Amer: >60 mL/min (ref >60)
GFR calc non Af Amer: >60 mL/min (ref >60)
GLUCOSE: 94 mg/dL (ref 65–99)
Potassium: 3.3 mmol/L — ABNORMAL LOW (ref 3.5–5.1)
Sodium: 140 mmol/L (ref 135–145)

## 2017-09-04 MED ORDER — MIDAZOLAM HCL 2 MG/2ML IJ SOLN
INTRAMUSCULAR | Status: AC
  Filled 2017-09-04: qty 4

## 2017-09-04 MED ORDER — FENTANYL CITRATE (PF) 100 MCG/2ML IJ SOLN
INTRAMUSCULAR | Status: AC | PRN
  Administered 2017-09-04 (×2): 50 ug via INTRAVENOUS

## 2017-09-04 MED ORDER — MIDAZOLAM HCL 2 MG/2ML IJ SOLN
INTRAMUSCULAR | Status: AC | PRN
  Administered 2017-09-04 (×2): 1 mg via INTRAVENOUS

## 2017-09-04 MED ORDER — LIDOCAINE HCL 2 % IJ SOLN
INTRAMUSCULAR | Status: AC
  Filled 2017-09-04: qty 10

## 2017-09-04 MED ORDER — FENTANYL CITRATE (PF) 100 MCG/2ML IJ SOLN
INTRAMUSCULAR | Status: AC
  Filled 2017-09-04: qty 4

## 2017-09-04 MED ORDER — SODIUM CHLORIDE 0.9 % IV SOLN: INTRAVENOUS | Status: DC

## 2017-09-04 NOTE — Procedures (Signed)
L supraclavicular LN Bx 18 g times five EBL 0 Comp 0

## 2017-09-04 NOTE — Discharge Instructions (Signed)
Moderate Conscious Sedation, Adult, Care After These instructions provide you with information about caring for yourself after your procedure. Your health care provider may also give you more specific instructions. Your treatment has been planned according to current medical practices, but problems sometimes occur. Call your health care provider if you have any problems or questions after your procedure. What can I expect after the procedure? After your procedure, it is common:  To feel sleepy for several hours.  To feel clumsy and have poor balance for several hours.  To have poor judgment for several hours.  To vomit if you eat too soon.  Follow these instructions at home: For at least 24 hours after the procedure:   Do not: ? Participate in activities where you could fall or become injured. ? Drive. ? Use heavy machinery. ? Drink alcohol. ? Take sleeping pills or medicines that cause drowsiness. ? Make important decisions or sign legal documents. ? Take care of children on your own.  Rest. Eating and drinking  Follow the diet recommended by your health care provider.  If you vomit: ? Drink water, juice, or soup when you can drink without vomiting. ? Make sure you have little or no nausea before eating solid foods. General instructions  Have a responsible adult stay with you until you are awake and alert.  Take over-the-counter and prescription medicines only as told by your health care provider.  If you smoke, do not smoke without supervision.  Keep all follow-up visits as told by your health care provider. This is important. Contact a health care provider if:  You keep feeling nauseous or you keep vomiting.  You feel light-headed.  You develop a rash.  You have a fever. Get help right away if:  You have trouble breathing. This information is not intended to replace advice given to you by your health care provider. Make sure you discuss any questions you  have with your health care provider. Document Released: 09/29/2013 Document Revised: 05/13/2016 Document Reviewed: 03/30/2016 Elsevier Interactive Patient Education  2018 [place]. Needle Biopsy, Care After Refer to this sheet in the next few weeks. These instructions provide you with information about caring for yourself after your procedure. Your health care provider may also give you more specific instructions. Your treatment has been planned according to current medical practices, but problems sometimes occur. Call your health care provider if you have any problems or questions after your procedure. What can I expect after the procedure? After your procedure, it is common to have soreness or tenderness at the puncture site. Follow these instructions at home:  Take over-the-counter and prescription medicines only as told by your health care provider.  Bathe and shower as told by your health care provider.  Follow instructions from your health care provider about: ? How to take care of your puncture site. ? When and how you should change your bandage (dressing). ? When you should remove your dressing.  Check your puncture site every day for signs of infection. Watch for: ? Redness, swelling, or worsening pain. ? Fluid, blood, or pus.  Return to your normal activities as told by your health care provider.  Keep all follow-up visits as told by your health care provider. This is important. Contact a health care provider if:  You have redness, swelling, or worsening pain at the site of your puncture.  You have fluid, blood, or pus coming from your puncture site.  You have a fever.  You have persistent nausea  or vomiting. Get help right away if:  You develop a rash.  You have difficulty breathing. This information is not intended to replace advice given to you by your health care provider. Make sure you discuss any questions you have with your health care provider. Document  Released: 06/28/2005 Document Revised: 05/16/2016 Document Reviewed: 01/16/2015 Elsevier Interactive Patient Education  [employer].

## 2017-09-04 NOTE — H&P (Signed)
Referring Physician(s): Ollis,Brandi L/Sood,V  Supervising Physician: Marybelle Killings  Patient Status:   [facility]  Chief Complaint: "I'm having a biopsy"   Subjective: Patient familiar to IR service from prior left thoracentesis on 08/08/17. He is a former long-term smoker with history of pulmonary hypertension, COPD, CHF. He has no prior history of malignancy. Recent PET scan has revealed evidence suggesting widespread malignancy, including within nodal stations, bones, left pleural space and lungs with differential including metastatic disease or lymphoproliferative process. He presents again today for image guided left supraclavicular lymph node biopsy for further evaluation. He currently denies fever, headache, back pain, nausea, vomiting or bleeding. He has had weight loss, left chest discomfort, dyspnea, cough,  abdominal discomfort. Past Medical History:  Diagnosis Date  . Anxiety   . CHF (congestive heart failure) (HCC)    x 3 episodes- [place]. pt. living here since 11-'1-16  . COPD with emphysema ([place])   . Depression   . Dyspnea   . HTN (hypertension)   . On home oxygen therapy   . Pulmonary HTN (HCC)    Dr. Marlyn Corporal is following.  . Recurrent left pleural effusion   . Sciatic leg pain    INTERMITTENT- occ. uses Hydrocodone as needed  . Seasonal allergies    Past Surgical History:  Procedure Laterality Date  . ANGIOPLASTY    . CARDIAC CATHETERIZATION     '15- [place]- no issues now.  . COLONOSCOPY WITH PROPOFOL N/A 02/09/2016   Procedure: COLONOSCOPY WITH PROPOFOL;  Surgeon: Carol Ada, MD;  Location: [facility];  Service: Endoscopy;  Laterality: N/A;  . IR THORACENTESIS ASP PLEURAL SPACE W/IMG GUIDE  08/08/2017  . MASTOIDECTOMY Left   . TONSILLECTOMY        Allergies: Patient has no known allergies.  Medications: Prior to Admission medications   Medication Sig Start Date End Date Taking? Authorizing Provider  feeding supplement (BOOST  HIGH PROTEIN) LIQD Take 1 Container by mouth daily.  09/14/15  Yes [provider]  fluticasone (FLONASE) 50 MCG/ACT nasal spray Place 2 sprays into both nostrils daily as needed for allergies or rhinitis.   Yes [provider]  ibuprofen (ADVIL,MOTRIN) 200 MG tablet Take 600 mg by mouth 3 (three) times daily as needed.   Yes [provider]  sertraline (ZOLOFT) 50 MG tablet Take 50 mg by mouth 2 (two) times daily.   Yes [provider]  sildenafil (REVATIO) 20 MG tablet Take 1 tablet (20 mg total) by mouth 3 (three) times daily. 04/16/16  Yes Chesley Mires, MD  tiotropium (SPIRIVA HANDIHALER) 18 MCG inhalation capsule Place 1 capsule (18 mcg total) into inhaler and inhale daily. 05/06/17  Yes Chesley Mires, MD  torsemide (DEMADEX) 20 MG tablet Take 1 tablet (20 mg total) by mouth as directed. Take whole tablet every Mon, Wed and Fri's; take 1/2 tablet all other days 07/15/17 10/13/17 Yes Weaver, Scott T, PA-C  albuterol (PROVENTIL) (2.5 MG/3ML) 0.083% nebulizer solution Take 3 mLs (2.5 mg total) by nebulization every 6 (six) hours as needed for wheezing or shortness of breath. 01/08/16   Chesley Mires, MD  ipratropium (ATROVENT) 0.02 % nebulizer solution Take 2.5 mLs (0.5 mg total) by nebulization 4 (four) times daily. 01/08/16   Chesley Mires, MD     Vital Signs: Blood pressure 123/78, heart rate 90, respirations 18, temp 97.9, O2 sat 100% room air    Physical Exam awake and alert. Cachectic-appearing white male. Chest with distant breath sounds bilaterally, more  so on left. Heart with regular rate and rhythm. Abdomen soft, positive bowel sounds, mild lower abdominal tenderness to palpation; no lower extremity edema  Imaging: No results found.  Labs:  CBC:  Recent Labs  09/04/17 1145  WBC 7.7  HGB 12.1*  HCT 36.9*  PLT 260    COAGS: No results for input(s): INR, APTT in the last 8760 hours.  BMP: No results for input(s): NA, K, CL, CO2, GLUCOSE, BUN,  CALCIUM, CREATININE, GFRNONAA, GFRAA in the last 8760 hours.  Invalid input(s): CMP  LIVER FUNCTION TESTS: No results for input(s): BILITOT, AST, ALT, ALKPHOS, PROT, ALBUMIN in the last 8760 hours.  Assessment and Plan: Mr. Rigg is a former long-term smoker with history of pulmonary hypertension, COPD, CHF. He has no prior history of malignancy. Recent PET scan has revealed evidence suggesting widespread malignancy, including within nodal stations, bones, left pleural space and lungs with differential including metastatic disease or lymphoproliferative process. He presents again today for image guided left supraclavicular lymph node biopsy for further evaluation.Risks and benefits discussed with the patient including, but not limited to bleeding, infection, damage to adjacent structures or low yield requiring additional tests. All of the patient's questions were answered, patient is agreeable to proceed. Consent signed and in chart.     Electronically Signed: D. Rowe Robert, PA-C 09/04/2017, 12:04 PM   I spent a total of 20 minutes at the the patient's bedside AND on the patient's hospital floor or unit, greater than 50% of which was counseling/coordinating care for image guided left supraclavicular lymph node biopsy.

## 2017-09-08 LAB — FUNGUS CULTURE WITH STAIN

## 2017-09-08 LAB — FUNGUS CULTURE RESULT

## 2017-09-08 LAB — FUNGAL ORGANISM REFLEX

## 2017-09-09 ENCOUNTER — Telehealth: Payer: Self-pay | Admitting: Pulmonary Disease

## 2017-09-09 ENCOUNTER — Encounter (HOSPITAL_COMMUNITY): Admission: RE | Admit: 2017-09-09 | Payer: Self-pay | Source: Ambulatory Visit

## 2017-09-09 NOTE — Telephone Encounter (Signed)
Called Jose Mckinney to review results of lymph node biopsy.  Pathology consistent with metastatic carcinoma, consistent with small cell carcinoma of the lung.  He reports he has had a difficult time with his breathing since his thoracentesis.  I am concerned that this is related to the underlying malignancy.    The patient has an appointment with Dr. Lebron Conners on 9/20 at [facility] for Oncology evalution.  Encouraged him to call P[facility]M if he has any new concerns / needs.   [name], NP-C [facility] Pgr: [phone] or if no answer [phone] 09/09/2017, 3:03 PM

## 2017-09-10 ENCOUNTER — Telehealth: Payer: Self-pay | Admitting: *Deleted

## 2017-09-10 NOTE — Telephone Encounter (Signed)
Oncology Nurse Navigator Documentation  Oncology Nurse Navigator Flowsheets 09/10/2017  Navigator Location [facility]  Navigator Encounter Type Telephone/per Dr. Lebron Conners, patient's appt for tomorrow is cancelled.  I called and updated patient that the appt was cancelled and that he will get a call tomorrow about re-scheduling. I left my name and phone number to call with questions.   Telephone Outgoing Call  Treatment Phase Pre-Tx/Tx Discussion  Barriers/Navigation Needs Coordination of Care  Interventions Coordination of Care  Coordination of Care Appts  Acuity Level 1  Time Spent with Patient 15

## 2017-09-11 ENCOUNTER — Ambulatory Visit: Payer: Medicare Other | Admitting: Hematology and Oncology

## 2017-09-11 ENCOUNTER — Encounter (HOSPITAL_COMMUNITY): Admission: RE | Admit: 2017-09-11 | Payer: Self-pay | Source: Ambulatory Visit

## 2017-09-11 ENCOUNTER — Telehealth: Payer: Self-pay | Admitting: *Deleted

## 2017-09-11 DIAGNOSIS — R918 Other nonspecific abnormal finding of lung field: Secondary | ICD-10-CM | POA: Insufficient documentation

## 2017-09-11 NOTE — Telephone Encounter (Signed)
Oncology Nurse Navigator Documentation  Oncology Nurse Navigator Flowsheets 09/11/2017  Navigator Location [facility]  Navigator Encounter Type Telephone  Telephone Outgoing Call/called and updated on appt with Erasmo Downer and Dr. Julien Nordmann  Treatment Phase Pre-Tx/Tx Discussion  Barriers/Navigation Needs Coordination of Care  Interventions Coordination of Care  Coordination of Care Appts  Acuity Level 1  Time Spent with Patient 30

## 2017-09-15 ENCOUNTER — Ambulatory Visit (HOSPITAL_BASED_OUTPATIENT_CLINIC_OR_DEPARTMENT_OTHER): Payer: Medicare Other | Admitting: Oncology

## 2017-09-15 ENCOUNTER — Other Ambulatory Visit: Payer: Self-pay | Admitting: Internal Medicine

## 2017-09-15 ENCOUNTER — Telehealth: Payer: Self-pay | Admitting: Oncology

## 2017-09-15 ENCOUNTER — Encounter: Payer: Self-pay | Admitting: Oncology

## 2017-09-15 ENCOUNTER — Other Ambulatory Visit (HOSPITAL_BASED_OUTPATIENT_CLINIC_OR_DEPARTMENT_OTHER): Payer: Medicare Other

## 2017-09-15 VITALS — BP 95/66 | HR 110 | Temp 98.3°F | Resp 18 | Ht 69.0 in | Wt 112.2 lb

## 2017-09-15 DIAGNOSIS — C3490 Malignant neoplasm of unspecified part of unspecified bronchus or lung: Secondary | ICD-10-CM

## 2017-09-15 DIAGNOSIS — E46 Unspecified protein-calorie malnutrition: Secondary | ICD-10-CM | POA: Diagnosis not present

## 2017-09-15 DIAGNOSIS — Z5111 Encounter for antineoplastic chemotherapy: Secondary | ICD-10-CM | POA: Insufficient documentation

## 2017-09-15 DIAGNOSIS — Z7189 Other specified counseling: Secondary | ICD-10-CM

## 2017-09-15 DIAGNOSIS — C3492 Malignant neoplasm of unspecified part of left bronchus or lung: Secondary | ICD-10-CM | POA: Diagnosis present

## 2017-09-15 DIAGNOSIS — C7951 Secondary malignant neoplasm of bone: Secondary | ICD-10-CM | POA: Diagnosis not present

## 2017-09-15 DIAGNOSIS — R918 Other nonspecific abnormal finding of lung field: Secondary | ICD-10-CM

## 2017-09-15 HISTORY — DX: Malignant neoplasm of unspecified part of left bronchus or lung: C34.92

## 2017-09-15 LAB — CBC WITH DIFFERENTIAL/PLATELET
BASO%: 0.3 % (ref 0.0–2.0)
BASOS ABS: 0 10*3/uL (ref 0.0–0.1)
EOS%: 1.5 % (ref 0.0–7.0)
Eosinophils Absolute: 0.1 10*3/uL (ref 0.0–0.5)
HCT: 38.3 % — ABNORMAL LOW (ref 38.4–49.9)
HEMOGLOBIN: 12.3 g/dL — AB (ref 13.0–17.1)
LYMPH%: 21 % (ref 14.0–49.0)
MCH: 30.8 pg (ref 27.2–33.4)
MCHC: 32.1 g/dL (ref 32.0–36.0)
MCV: 95.8 fL (ref 79.3–98.0)
MONO#: 1 10*3/uL — ABNORMAL HIGH (ref 0.1–0.9)
MONO%: 10.4 % (ref 0.0–14.0)
NEUT#: 6.3 10*3/uL (ref 1.5–6.5)
NEUT%: 66.8 % (ref 39.0–75.0)
Platelets: 302 10*3/uL (ref 140–400)
RBC: 4 10*6/uL — ABNORMAL LOW (ref 4.20–5.82)
RDW: 13.5 % (ref 11.0–14.6)
WBC: 9.5 10*3/uL (ref 4.0–10.3)
lymph#: 2 10*3/uL (ref 0.9–3.3)

## 2017-09-15 LAB — COMPREHENSIVE METABOLIC PANEL
ALT: 22 U/L (ref 0–55)
AST: 64 U/L — AB (ref 5–34)
Albumin: 3.6 g/dL (ref 3.5–5.0)
Alkaline Phosphatase: 87 U/L (ref 40–150)
Anion Gap: 11 mEq/L (ref 3–11)
BUN: 15.5 mg/dL (ref 7.0–26.0)
CHLORIDE: 96 meq/L — AB (ref 98–109)
CO2: 34 mEq/L — ABNORMAL HIGH (ref 22–29)
Calcium: 10.7 mg/dL — ABNORMAL HIGH (ref 8.4–10.4)
Creatinine: 0.9 mg/dL (ref 0.7–1.3)
EGFR: 82 mL/min/{1.73_m2} — ABNORMAL LOW (ref >90)
GLUCOSE: 130 mg/dL (ref 70–140)
POTASSIUM: 4.2 meq/L (ref 3.5–5.1)
SODIUM: 141 meq/L (ref 136–145)
Total Bilirubin: 0.39 mg/dL (ref 0.20–1.20)
Total Protein: 7.7 g/dL (ref 6.4–8.3)

## 2017-09-15 MED ORDER — LORAZEPAM 0.5 MG PO TABS: 0.5000 mg | ORAL_TABLET | Freq: Three times a day (TID) | ORAL | 0 refills | Status: DC

## 2017-09-15 MED ORDER — PROCHLORPERAZINE MALEATE 10 MG PO TABS: 10.0000 mg | ORAL_TABLET | Freq: Four times a day (QID) | ORAL | 1 refills | Status: AC | PRN

## 2017-09-15 NOTE — Telephone Encounter (Signed)
Gave avs and calendar for September - December

## 2017-09-15 NOTE — Progress Notes (Signed)
START ON PATHWAY REGIMEN - Small Cell Lung     A cycle is every 21 days:     Etoposide      Carboplatin   **Always confirm dose/schedule in your pharmacy ordering system**    Patient Characteristics: Extensive Stage, First Line Stage Grouping: Extensive AJCC T Category: T2a AJCC N Category: N3 AJCC M Category: M1c AJCC 8 Stage Grouping: IVB Line of therapy: First Line Would you be surprised if this patient died  in the next year<= I would NOT be surprised if this patient died in the next year Intent of Therapy: Non-Curative / Palliative Intent, Discussed with Patient

## 2017-09-15 NOTE — Assessment & Plan Note (Signed)
Due to the patient's weight loss, I referred the patient to the dietitian for further recommendations.

## 2017-09-15 NOTE — Assessment & Plan Note (Addendum)
The patient was seen with Dr. Julien Nordmann who had a lengthy discussion with the patient and his family today about his current disease stage, prognosis and treatment options.  Dr. Julien Nordmann discussed with the patient the goals of care and his options for treatment at this point. Explained to the patient that he has incurable condition and treatment is of a palliative nature. The patient was given the option of proceeding with systemic chemotherapy consisting of carboplatin for an AUC of 5 with etoposide 100 mg/m versus palliative care. Discussed with the patient his current prognosis with and without treatment.  The patient and his family are interested in proceeding with treatment. Carboplatin will be given on day 1 and etoposide will be given days 1 through 3. Side effects of chemotherapy were discussed with the patient and his family which include, but are not limited to myelosuppression, alopecia, nausea, and vomiting. The patient has been referred for chemotherapy education class prior to beginning chemotherapy. We will plan to begin chemotherapy on 09/22/2017. The patient will have weekly labs while receiving his chemotherapy. A prescription for Compazine 10 mg every 6 hours as needed her nausea was sent to his pharmacy. Discussed placing a Port-A-Cath for chemotherapy administration, but the patient wishes to hold off on this for now.  An MRI of the brain was ordered to evaluate for brain metastases.  The patient will return for chemotherapy on 0/12/2016 and a follow-up visit in approximately 2 weeks to evaluate side effects of treatment.  The patient and his family agreed to the current plan. The patient voices understanding of current disease status and treatment options and is in agreement with the current care plan. All questions were answered. The patient knows to call the clinic with any problems, questions or concerns. We can certainly see the patient much sooner if necessary.

## 2017-09-15 NOTE — Progress Notes (Signed)
[facility] Initial Visit:  Patient Care Team: Merwyn Katos as PCP - General (Physician Assistant)  REASON FOR CONSULTATION: 71 year old white male with new diagnosis of extensive stage small cell lung cancer.    No history exists.    HISTORY OF PRESENTING ILLNESS: Jose Mckinney 71 y.o. male presents to the clinic today with his wife and son. Jose Mckinney has a past medical history significant for  COPD (oxygen dependent), history of smoking 2-3 packs of cigarettes per day for approximately 50 years,anxiety and depression, congestive heart failure, pulmonary hypertension, seasonal allergies, and a history of hypertension. The patient reports that he has had shortness of breath going back to 2013 when he lived in [place]. He has had previous workup over the years in [place] including a PET scan in approximately 2015 which he reports as negative for malignancy. he has also had a recurrent left pleural effusion and has undergone thoracentesis about 3 times in the past. Two of these times were when he lived in [place]. He reports that he has been oxygen dependent for about 3 years. The patient relocated to [facility] approximately 2 years ago. He has been followed by his pulmonologist for his COPD. The patient developed progressive shortness of breath in July 2018 and was evaluated by pulmonology. A CT of the chest was obtained on 07/16/2017 which showed diffuse bronchial wall thickening with moderate centrilobular and paraseptal emphysema, calcified pleural plaques in the thorax bilaterally, indicative of asbestos related pleural disease, multiple pulmonary nodules in the lungs bilaterally the largest in the right lung of which measured 9 mm. A thoracentesis was performed on 08/08/2017 which she will about 120 mL of amber fluid. Cytology was negative for malignancy. A PET scan was ordered due to his history of pleural effusion, weight loss, tobacco use,  and concern for asbestos plaques on the CT scan. PET scan on 08/27/2017 showed widespread malignancy, including within nodal stations, bones, left pleural space, and lungs. The patient was sent for an ultrasound-guided core biopsy of the left supraclavicular lymph node. Pathology report showed metastatic carcinoma, consistent with small cell carcinoma of the lung (accession number [id number]). The patient was referred here to our clinic for evaluation and recommendations for management of his small cell lung cancer.  When seen today, the patient continues to have shortness of breath which is been progressive. He also reports left-sided rib/chest pain with cough. Denies hemoptysis. He has lost about 45 pounds over the past 2 years.He has a poor appetite. He denies having any headaches or visual changes. Denies nausea, vomiting, diarrhea, constipation. Denies fevers and chills. Family history is unknown because he is adopted. The patient used to work in [employer]. He quit smoking approximately 3 years ago Reports that he drinks alcohol about 3 beers per day. No history of drug abuse.  Review of Systems  Constitutional: Positive for appetite change, fatigue and unexpected weight change. Negative for chills and fever.  HENT:  Negative.   Eyes: Negative.   Respiratory: Positive for cough and shortness of breath. Negative for hemoptysis and wheezing.   Cardiovascular: Negative for leg swelling and palpitations.       Left anterior rib pain.  Gastrointestinal: Negative.   Genitourinary: Negative.    Musculoskeletal: Negative.   Skin: Negative.   Neurological: Negative.   Hematological: Negative.   Psychiatric/Behavioral: Negative for confusion and decreased concentration. The patient is nervous/anxious.     MEDICAL HISTORY: Past Medical History:  Diagnosis Date  .  Anxiety   . CHF (congestive heart failure) (HCC)    x 3 episodes- [place]. pt. living here since 11-'1-16  . COPD with  emphysema ([place])   . Depression   . Dyspnea   . HTN (hypertension)   . On home oxygen therapy   . Pulmonary HTN (HCC)    Dr. Marlyn Corporal is following.  . Recurrent left pleural effusion   . Sciatic leg pain    INTERMITTENT- occ. uses Hydrocodone as needed  . Seasonal allergies   . Small cell lung cancer, left ([place]) 09/15/2017    SURGICAL HISTORY: Past Surgical History:  Procedure Laterality Date  . ANGIOPLASTY    . CARDIAC CATHETERIZATION     '15- [place]- no issues now.  . COLONOSCOPY WITH PROPOFOL N/A 02/09/2016   Procedure: COLONOSCOPY WITH PROPOFOL;  Surgeon: Carol Ada, MD;  Location: [facility];  Service: Endoscopy;  Laterality: N/A;  . IR THORACENTESIS ASP PLEURAL SPACE W/IMG GUIDE  08/08/2017  . MASTOIDECTOMY Left   . TONSILLECTOMY      SOCIAL HISTORY: Social History   Social History  . Marital status: Married    Spouse name: N/A  . Number of children: N/A  . Years of education: N/A   Occupational History  . Retired    Social History Main Topics  . Smoking status: Former Smoker    Packs/day: 2.50    Years: 50.00    Types: Cigarettes    Quit date: 07/10/2014  . Smokeless tobacco: Never Used     Comment: Smoked 2.5-3 PPD at highest amount.   . Alcohol use 0.0 oz/week     Comment: 36 ozs of beer daily   . Drug use: No  . Sexual activity: Not on file   Other Topics Concern  . Not on file   Social History Narrative   Patient is adopted -- UNKNOWN family history    FAMILY HISTORY Family History  Problem Relation Age of Onset  . Adopted: Yes  . Family history unknown: Yes    ALLERGIES:  has No Known Allergies.  MEDICATIONS:  Current Outpatient Prescriptions  Medication Sig Dispense Refill  . albuterol (PROVENTIL) (2.5 MG/3ML) 0.083% nebulizer solution Take 3 mLs (2.5 mg total) by nebulization every 6 (six) hours as needed for wheezing or shortness of breath. 75 mL 12  . feeding supplement (BOOST HIGH PROTEIN) LIQD Take 1 Container by mouth  daily.     . fluticasone (FLONASE) 50 MCG/ACT nasal spray Place 2 sprays into both nostrils daily as needed for allergies or rhinitis.    Marland Kitchen ibuprofen (ADVIL,MOTRIN) 200 MG tablet Take 600 mg by mouth 3 (three) times daily as needed.    Marland Kitchen ipratropium (ATROVENT) 0.02 % nebulizer solution Take 2.5 mLs (0.5 mg total) by nebulization 4 (four) times daily. 75 mL 12  . LORazepam (ATIVAN) 0.5 MG tablet Take 1 tablet (0.5 mg total) by mouth every 8 (eight) hours. Take 1 tab 1 hour prior to MRI. May repeat X1 at time of MRI. 2 tablet 0  . prochlorperazine (COMPAZINE) 10 MG tablet Take 1 tablet (10 mg total) by mouth every 6 (six) hours as needed for nausea or vomiting. 30 tablet 1  . sertraline (ZOLOFT) 50 MG tablet Take 50 mg by mouth 2 (two) times daily.    . sildenafil (REVATIO) 20 MG tablet Take 1 tablet (20 mg total) by mouth 3 (three) times daily. 90 tablet 6  . tiotropium (SPIRIVA HANDIHALER) 18 MCG inhalation capsule Place 1 capsule (18  mcg total) into inhaler and inhale daily. 30 capsule 12  . torsemide (DEMADEX) 20 MG tablet Take 1 tablet (20 mg total) by mouth as directed. Take whole tablet every Mon, Wed and Fri's; take 1/2 tablet all other days 180 tablet 3   No current facility-administered medications for this visit.     PHYSICAL EXAMINATION:  ECOG PERFORMANCE STATUS: 2 - Symptomatic, <50% confined to bed   Vitals:   09/15/17 0904  BP: 95/66  Pulse: (!) 110  Resp: 18  Temp: 98.3 F (36.8 C)  SpO2: 91%    Filed Weights   09/15/17 0904  Weight: 112 lb 3.2 oz (50.9 kg)     Physical Exam  Constitutional: He is oriented to person, place, and time.  Thin, cachectic male who is in no acute distress.  HENT:  Head: Normocephalic.  Mouth/Throat: Oropharynx is clear and moist. No oropharyngeal exudate.  Eyes: Pupils are equal, round, and reactive to light. Conjunctivae and EOM are normal. Right eye exhibits no discharge. Left eye exhibits no discharge. No scleral icterus.  Neck:  Normal range of motion. Neck supple. No JVD present. No tracheal deviation present.  Cardiovascular: Regular rhythm, normal heart sounds and intact distal pulses.  Exam reveals no gallop and no friction rub.   No murmur heard. Heart rate tachycardic.  Pulmonary/Chest: Effort normal. No respiratory distress. He has no wheezes. He has no rales.  Diminished breath sounds bilaterally.  Abdominal: Soft. Bowel sounds are normal. He exhibits no distension and no mass. There is no tenderness.  Musculoskeletal: Normal range of motion. He exhibits no edema.  Lymphadenopathy:    He has no cervical adenopathy.  Neurological: He is alert and oriented to person, place, and time. He displays normal reflexes. No cranial nerve deficit. He exhibits normal muscle tone. Gait normal. Coordination normal.  Skin: Skin is warm and dry. No rash noted. No erythema. No pallor.  Psychiatric: Mood, memory, affect and judgment normal.  Vitals reviewed.    LABORATORY DATA: I have personally reviewed the data as listed:  Appointment on 09/15/2017  Component Date Value Ref Range Status  . WBC 09/15/2017 9.5  4.0 - 10.3 10e3/uL Final  . NEUT# 09/15/2017 6.3  1.5 - 6.5 10e3/uL Final  . HGB 09/15/2017 12.3* 13.0 - 17.1 g/dL Final  . HCT 09/15/2017 38.3* 38.4 - 49.9 % Final  . Platelets 09/15/2017 302  140 - 400 10e3/uL Final  . MCV 09/15/2017 95.8  79.3 - 98.0 fL Final  . MCH 09/15/2017 30.8  27.2 - 33.4 pg Final  . MCHC 09/15/2017 32.1  32.0 - 36.0 g/dL Final  . RBC 09/15/2017 4.00* 4.20 - 5.82 10e6/uL Final  . RDW 09/15/2017 13.5  11.0 - 14.6 % Final  . lymph# 09/15/2017 2.0  0.9 - 3.3 10e3/uL Final  . MONO# 09/15/2017 1.0* 0.1 - 0.9 10e3/uL Final  . Eosinophils Absolute 09/15/2017 0.1  0.0 - 0.5 10e3/uL Final  . Basophils Absolute 09/15/2017 0.0  0.0 - 0.1 10e3/uL Final  . NEUT% 09/15/2017 66.8  39.0 - 75.0 % Final  . LYMPH% 09/15/2017 21.0  14.0 - 49.0 % Final  . MONO% 09/15/2017 10.4  0.0 - 14.0 % Final  .  EOS% 09/15/2017 1.5  0.0 - 7.0 % Final  . BASO% 09/15/2017 0.3  0.0 - 2.0 % Final  . Sodium 09/15/2017 141  136 - 145 mEq/L Final  . Potassium 09/15/2017 4.2  3.5 - 5.1 mEq/L Final  . Chloride 09/15/2017 96* 98 - 109  mEq/L Final  . CO2 09/15/2017 34* 22 - 29 mEq/L Final  . Glucose 09/15/2017 130  70 - 140 mg/dl Final   Glucose reference range is for nonfasting patients. Fasting glucose reference range is 70- 100.  Marland Kitchen BUN 09/15/2017 15.5  7.0 - 26.0 mg/dL Final  . Creatinine 09/15/2017 0.9  0.7 - 1.3 mg/dL Final  . Total Bilirubin 09/15/2017 0.39  0.20 - 1.20 mg/dL Final  . Alkaline Phosphatase 09/15/2017 87  40 - 150 U/L Final  . AST 09/15/2017 64* 5 - 34 U/L Final  . ALT 09/15/2017 22  0 - 55 U/L Final  . Total Protein 09/15/2017 7.7  6.4 - 8.3 g/dL Final  . Albumin 09/15/2017 3.6  3.5 - 5.0 g/dL Final  . Calcium 09/15/2017 10.7* 8.4 - 10.4 mg/dL Final  . Anion Gap 09/15/2017 11  3 - 11 mEq/L Final  . EGFR 09/15/2017 82* >90 ml/min/1.73 m2 Final   eGFR is calculated using the CKD-EPI Creatinine Equation (2009)  Hospital Outpatient Visit on 09/04/2017  Component Date Value Ref Range Status  . WBC 09/04/2017 7.7  4.0 - 10.5 K/uL Final  . RBC 09/04/2017 4.01* 4.22 - 5.81 MIL/uL Final  . Hemoglobin 09/04/2017 12.1* 13.0 - 17.0 g/dL Final  . HCT 09/04/2017 36.9* 39.0 - 52.0 % Final  . MCV 09/04/2017 92.0  78.0 - 100.0 fL Final  . MCH 09/04/2017 30.2  26.0 - 34.0 pg Final  . MCHC 09/04/2017 32.8  30.0 - 36.0 g/dL Final  . RDW 09/04/2017 13.2  11.5 - 15.5 % Final  . Platelets 09/04/2017 260  150 - 400 K/uL Final  . Neutrophils Relative % 09/04/2017 63  % Final  . Neutro Abs 09/04/2017 4.8  1.7 - 7.7 K/uL Final  . Lymphocytes Relative 09/04/2017 23  % Final  . Lymphs Abs 09/04/2017 1.8  0.7 - 4.0 K/uL Final  . Monocytes Relative 09/04/2017 12  % Final  . Monocytes Absolute 09/04/2017 0.9  0.1 - 1.0 K/uL Final  . Eosinophils Relative 09/04/2017 2  % Final  . Eosinophils Absolute  09/04/2017 0.2  0.0 - 0.7 K/uL Final  . Basophils Relative 09/04/2017 0  % Final  . Basophils Absolute 09/04/2017 0.0  0.0 - 0.1 K/uL Final  . Prothrombin Time 09/04/2017 13.2  11.4 - 15.2 seconds Final  . INR 09/04/2017 1.01   Final  . Sodium 09/04/2017 140  135 - 145 mmol/L Final  . Potassium 09/04/2017 3.3* 3.5 - 5.1 mmol/L Final  . Chloride 09/04/2017 95* 101 - 111 mmol/L Final  . CO2 09/04/2017 34* 22 - 32 mmol/L Final  . Glucose, Bld 09/04/2017 94  65 - 99 mg/dL Final  . BUN 09/04/2017 20  6 - 20 mg/dL Final  . Creatinine, Ser 09/04/2017 0.89  0.61 - 1.24 mg/dL Final  . Calcium 09/04/2017 9.9  8.9 - 10.3 mg/dL Final  . GFR calc non Af Amer 09/04/2017 >60  >60 mL/min Final  . GFR calc Af Amer 09/04/2017 >60  >60 mL/min Final   Comment: (NOTE) The eGFR has been calculated using the CKD EPI equation. This calculation has not been validated in all clinical situations. eGFR's persistently <60 mL/min signify possible Chronic Kidney Disease.   Georgiann Hahn gap 09/04/2017 11  5 - 15 Final  Hospital Outpatient Visit on 08/27/2017  Component Date Value Ref Range Status  . Glucose-Capillary 08/27/2017 106* 65 - 99 mg/dL Final    RADIOGRAPHIC STUDIES:  Nm Pet Image Initial (pi) Skull Base  To Thigh  Result Date: 08/27/2017 CLINICAL DATA:  Initial treatment strategy for pleural effusion. Left-sided and probable right-sided rounded atelectasis. Pulmonary nodules. EXAM: NUCLEAR MEDICINE PET SKULL BASE TO THIGH TECHNIQUE: 6.4 mCi F-18 FDG was injected intravenously. Full-ring PET imaging was performed from the skull base to thigh after the radiotracer. CT data was obtained and used for attenuation correction and anatomic localization. FASTING BLOOD GLUCOSE:  Value: 106 mg/dl COMPARISON:  Chest CT 07/16/2017.  Chest radiograph 08/08/2017. FINDINGS: NECK: Hypermetabolic left supraclavicular node measures 1.4 cm and a S.U.V. max of 9.9 on image 46/series 4. Bilateral carotid atherosclerosis. CHEST:  Hypermetabolic mediastinal nodes. An example note is difficult to differentiate from the adjacent esophagus. Measures on the order of 1.0 cm and a S.U.V. max of 9.5 on image 67/series 4. Subcarinal hypermetabolism is without well-defined adenopathy but measures a S.U.V. max of 7.7. Extensive left-sided pleural hypermetabolism. This corresponds to areas of soft tissue nodularity within. Example at a S.U.V. max of 8.9 posteriorly and medially on image 69/series 4. The left lower lobe opacity, with morphology favoring rounded atelectasis, demonstrates moderate hypermetabolism. Example at a S.U.V. max of 5.5 on image 42/series 8. A right lower lobe pulmonary nodule is hypermetabolic. This measures 9 mm and a S.U.V. max of 3.2 on image 57/series 8. A nodule or node adjacent the left side of the sternum measures 1.0 cm and a S.U.V. max of 5.9 on image 81/series 4. Left-sided pulmonary volume loss. Chronic left-sided pleural effusion. Right pleural thickening with bilateral pleural calcifications. Mild cardiomegaly. Coronary artery atherosclerosis. Centrilobular emphysema. ABDOMEN/PELVIS: Hypermetabolic retroperitoneal adenopathy. Example left periaortic nodes on the order of 1.4 cm and a S.U.V. max of 9.1 on image 118/series 4. Left adrenal thickening. Bilateral renal vascular calcifications. Possible concurrent left-sided nephrolithiasis. Advanced abdominal aortic atherosclerosis. Mild prostatomegaly. SKELETON: Multifocal osseous hypermetabolism. Example lesion within the L1 vertebral body measures a S.U.V. max of 6.9. There is a lucent lesion within the left humeral head which measures 1.0 cm and a S.U.V. max of 5.8 on image 47/series 4. Diffuse osteopenia. IMPRESSION: 1. Widespread malignancy, including within nodal stations, bones, left pleural space, and lungs. Differential considerations include metastatic disease (possibly lung or pleura) or lymphoproliferative process such as lymphoma. 2. Coronary artery  atherosclerosis. Aortic Atherosclerosis (ICD10-I70.0). 3. Asbestos related pleural disease. Electronically Signed   By: Abigail Miyamoto M.D.   On: 08/27/2017 16:28   [place] Core Biopsy (lymph Nodes)  Result Date: 09/04/2017 INDICATION: Left supraclavicular adenopathy EXAM: ULTRASOUND GUIDED CORE BIOPSY OF LEFT SUPRACLAVICULAR LYMPH NODE MEDICATIONS: None. ANESTHESIA/SEDATION: Fentanyl 100 mcg IV; Versed 2 mg IV Moderate Sedation Time:  10 The patient was continuously monitored during the procedure by the interventional radiology nurse under my direct supervision. PROCEDURE: The procedure, risks, benefits, and alternatives were explained to the patient. Questions regarding the procedure were encouraged and answered. The patient understands and consents to the procedure. The left supraclavicular lesion was prepped with ChloraPrep in a sterile fashion, and a sterile drape was applied covering the operative field. A sterile gown and sterile gloves were used for the procedure. Local anesthesia was provided with 1% Lidocaine. Under sonographic guidance, 5 18 gauge core biopsies of the enlarged left supraclavicular lymph node were obtained and placed in saline. COMPLICATIONS: None immediate. FINDINGS: Imaging documents needle placement in the left supraclavicular lymph node. Final imaging demonstrates no evidence of hemorrhage. IMPRESSION: Successful ultrasound-guided core biopsy of an enlarged left supraclavicular lymph node. Electronically Signed   By: Marybelle Killings M.D.   On:  09/04/2017 15:19   PATHOLOGY:   Diagnosis Lymph node, needle/core biopsy, left supraclavicular METASTATIC CARCINOMA, CONSISTENT WITH SMALL CELL CARCINOMA OF THE LUNG. Microscopic Comment The neoplasm stains positive for ck8/18, TTF-1, synaptophysin and chromogranin, negative for prostein. The morphology and immunostains pattern support the above diagnosis. Casimer Lanius MD Pathologist, Electronic Signature (Case signed  09/09/2017)  ASSESSMENT:  This is a pleasant 71 year old white male diagnosed with extensive stage small cell lung cancer in September 2018. The patient presented with progressive shortness of breath and weight loss. He has widespread malignancy including his lymph nodes and bones.  PLAN:  Small cell lung cancer, left ([place]) The patient was seen with Dr. Julien Nordmann who had a lengthy discussion with the patient and his family today about his current disease stage, prognosis and treatment options.  Dr. Julien Nordmann discussed with the patient the goals of care and his options for treatment at this point. Explained to the patient that he has incurable condition and treatment is of a palliative nature. The patient was given the option of proceeding with systemic chemotherapy consisting of carboplatin for an AUC of 5 with etoposide 100 mg/m versus palliative care. Discussed with the patient his current prognosis with and without treatment.  The patient and his family are interested in proceeding with treatment. Carboplatin will be given on day 1 and etoposide will be given days 1 through 3. Side effects of chemotherapy were discussed with the patient and his family which include, but are not limited to myelosuppression, alopecia, nausea, and vomiting. The patient has been referred for chemotherapy education class prior to beginning chemotherapy. We will plan to begin chemotherapy on 09/22/2017. The patient will have weekly labs while receiving his chemotherapy. A prescription for Compazine 10 mg every 6 hours as needed her nausea was sent to his pharmacy. Discussed placing a Port-A-Cath for chemotherapy administration, but the patient wishes to hold off on this for now.  An MRI of the brain was ordered to evaluate for brain metastases.  The patient will return for chemotherapy on 0/12/2016 and a follow-up visit in approximately 2 weeks to evaluate side effects of treatment.  The patient and his family agreed to  the current plan. The patient voices understanding of current disease status and treatment options and is in agreement with the current care plan. All questions were answered. The patient knows to call the clinic with any problems, questions or concerns. We can certainly see the patient much sooner if necessary.  Protein calorie malnutrition ([place]) Due to the patient's weight loss, I referred the patient to the dietitian for further recommendations.   Orders Placed This Encounter  Procedures  . Jose Brain W Wo Contrast    Standing Status:   Future    Standing Expiration Date:   09/15/2018    Order Specific Question:   If indicated for the ordered procedure, I authorize the administration of contrast media per Radiology protocol    Answer:   Yes    Order Specific Question:   What is the patient's sedation requirement?    Answer:   No Sedation    Order Specific Question:   Does the patient have a pacemaker or implanted devices?    Answer:   No    Order Specific Question:   Radiology Contrast Protocol - do NOT remove file path    Answer:   [email or web address]    Order Specific Question:   Reason for Exam additional comments    Answer:   New dx of extensive  stage smalle cell lung cancer. Eval for brain metastasis.    Order Specific Question:   Preferred imaging location?    Answer:   [facility] (table limit-350 lbs)  . CBC with Differential    Standing Status:   Standing    Number of Occurrences:   18    Standing Expiration Date:   09/15/2018  . Comprehensive metabolic panel    Standing Status:   Standing    Number of Occurrences:   18    Standing Expiration Date:   09/15/2018  . Amb Referral to Nutrition and Diabetic E    Referral Priority:   Routine    Referral Type:   Consultation    Referral Reason:   Specialty Services Required    Number of Visits Requested:   1    All questions were answered. The patient knows to call the clinic with any  problems, questions or concerns.  Jose Bussing, Jose Mckinney  09/15/2017 4:12 PM   ADDENDUM: Hematology/Oncology Attending: I had a face to face encounter with the patient today. I recommended his care plan. This is a very pleasant 71 years old white male with multiple medical problems including COPD and recently diagnosed with extensive stage small cell lung cancer involving the left hemithorax as well as metastatic bone disease. I had a lengthy discussion with the patient and his family including his wife and son that were accompanied him in addition to his other son who was available by phone. I explained to the patient that he has incurable condition and on the treatment will be off palliative nature. I gave the patient the option of palliative care and hospice referral versus consideration of palliative systemic chemotherapy with carboplatin for AUC of 5 on day 1 and etoposide 100 MG/M2 on days 1, 2 and 3 with Neulasta support. I discussed with the patient adverse effect of the chemotherapy including but not limited to alopecia, myelosuppression, nausea and vomiting, peripheral neuropathy, liver or renal dysfunction. The patient and his family would like to consider the treatment with systemic chemotherapy. He is expected to start the first cycle of this treatment next week. I will arrange for the patient to have a chemotherapy education class before the first dose of his treatment. I will also complete the staging workup by ordering a MRI of the brain to rule out brain metastasis. We will see the patient back for follow-up visit in 2 weeks for evaluation and management any adverse effect of his treatment. For COPD, he will continue his current care and evaluation by his pulmonologist Dr. Chase Caller. The patient was advised to call immediately if he has any concerning symptoms in the interval.  Disclaimer: This note was dictated with voice recognition software. Similar sounding words can inadvertently be  transcribed and may be missed upon review. Eilleen Kempf, MD 09/15/17

## 2017-09-16 ENCOUNTER — Encounter (HOSPITAL_COMMUNITY): Admission: RE | Admit: 2017-09-16 | Payer: Self-pay | Source: Ambulatory Visit

## 2017-09-17 ENCOUNTER — Telehealth: Payer: Self-pay | Admitting: Medical Oncology

## 2017-09-17 ENCOUNTER — Encounter: Payer: Self-pay | Admitting: *Deleted

## 2017-09-17 ENCOUNTER — Telehealth: Payer: Self-pay | Admitting: Internal Medicine

## 2017-09-17 ENCOUNTER — Other Ambulatory Visit: Payer: Medicare Other

## 2017-09-17 NOTE — Telephone Encounter (Signed)
Returned call - I changed pharmacy per message wife left and told her it was ok for pt to receive flu vaccine but needs to see PCP for high dose flue vaccine.

## 2017-09-17 NOTE — Telephone Encounter (Signed)
Spoke with patients wife regarding his nutrition appts. She said she would call back to confirm the appointment - she didn't know if he'd want to have that appt.

## 2017-09-18 ENCOUNTER — Encounter (HOSPITAL_COMMUNITY)
Admission: RE | Admit: 2017-09-18 | Discharge: 2017-09-18 | Disposition: A | Discharge status: Home / Self Care | Payer: Self-pay | Source: Ambulatory Visit | Attending: Pulmonary Disease | Admitting: Pulmonary Disease

## 2017-09-18 ENCOUNTER — Telehealth: Payer: Self-pay | Admitting: Internal Medicine

## 2017-09-18 NOTE — Telephone Encounter (Signed)
Called regarding October appointments

## 2017-09-18 NOTE — Progress Notes (Signed)
Jose Mckinney has decided to drop from the [facility]monary [facility] Program due to his recent diagnosis of cancer. I have encouraged him to call us if we can be of assistance to him in the future.

## 2017-09-19 ENCOUNTER — Ambulatory Visit (HOSPITAL_COMMUNITY)
Admission: RE | Admit: 2017-09-19 | Discharge: 2017-09-19 | Disposition: A | Discharge status: Home / Self Care | Payer: Medicare Other | Source: Ambulatory Visit | Attending: Oncology | Admitting: Oncology

## 2017-09-19 DIAGNOSIS — I6782 Cerebral ischemia: Secondary | ICD-10-CM | POA: Insufficient documentation

## 2017-09-19 DIAGNOSIS — C3490 Malignant neoplasm of unspecified part of unspecified bronchus or lung: Secondary | ICD-10-CM | POA: Insufficient documentation

## 2017-09-19 MED ORDER — GADOBENATE DIMEGLUMINE 529 MG/ML IV SOLN
10.0000 mL | Freq: Once | INTRAVENOUS | Status: AC | PRN
  Administered 2017-09-19: 10 mL via INTRAVENOUS

## 2017-09-22 ENCOUNTER — Other Ambulatory Visit (HOSPITAL_BASED_OUTPATIENT_CLINIC_OR_DEPARTMENT_OTHER): Payer: Medicare Other

## 2017-09-22 ENCOUNTER — Ambulatory Visit (HOSPITAL_BASED_OUTPATIENT_CLINIC_OR_DEPARTMENT_OTHER): Payer: Medicare Other

## 2017-09-22 VITALS — BP 125/78 | HR 98 | Temp 98.2°F | Resp 18

## 2017-09-22 DIAGNOSIS — Z5111 Encounter for antineoplastic chemotherapy: Secondary | ICD-10-CM | POA: Diagnosis present

## 2017-09-22 DIAGNOSIS — C3492 Malignant neoplasm of unspecified part of left bronchus or lung: Secondary | ICD-10-CM | POA: Diagnosis present

## 2017-09-22 DIAGNOSIS — C3490 Malignant neoplasm of unspecified part of unspecified bronchus or lung: Secondary | ICD-10-CM

## 2017-09-22 LAB — ACID FAST CULTURE WITH REFLEXED SENSITIVITIES: ACID FAST CULTURE - AFSCU3: NEGATIVE

## 2017-09-22 LAB — COMPREHENSIVE METABOLIC PANEL
ALBUMIN: 3.6 g/dL (ref 3.5–5.0)
ALK PHOS: 86 U/L (ref 40–150)
ALT: 16 U/L (ref 0–55)
AST: 54 U/L — AB (ref 5–34)
Anion Gap: 11 mEq/L (ref 3–11)
BUN: 20.4 mg/dL (ref 7.0–26.0)
CHLORIDE: 96 meq/L — AB (ref 98–109)
CO2: 30 meq/L — AB (ref 22–29)
Calcium: 10.5 mg/dL — ABNORMAL HIGH (ref 8.4–10.4)
Creatinine: 0.9 mg/dL (ref 0.7–1.3)
EGFR: 87 mL/min/{1.73_m2} — ABNORMAL LOW (ref >90)
GLUCOSE: 100 mg/dL (ref 70–140)
POTASSIUM: 3.9 meq/L (ref 3.5–5.1)
SODIUM: 137 meq/L (ref 136–145)
Total Bilirubin: 0.34 mg/dL (ref 0.20–1.20)
Total Protein: 7.5 g/dL (ref 6.4–8.3)

## 2017-09-22 LAB — CBC WITH DIFFERENTIAL/PLATELET
BASO%: 0.6 % (ref 0.0–2.0)
BASOS ABS: 0.1 10*3/uL (ref 0.0–0.1)
EOS%: 1 % (ref 0.0–7.0)
Eosinophils Absolute: 0.1 10*3/uL (ref 0.0–0.5)
HCT: 37.2 % — ABNORMAL LOW (ref 38.4–49.9)
HEMOGLOBIN: 12.4 g/dL — AB (ref 13.0–17.1)
LYMPH%: 14.9 % (ref 14.0–49.0)
MCH: 30.8 pg (ref 27.2–33.4)
MCHC: 33.4 g/dL (ref 32.0–36.0)
MCV: 92.2 fL (ref 79.3–98.0)
MONO#: 1 10*3/uL — ABNORMAL HIGH (ref 0.1–0.9)
MONO%: 11.5 % (ref 0.0–14.0)
NEUT#: 6 10*3/uL (ref 1.5–6.5)
NEUT%: 72 % (ref 39.0–75.0)
Platelets: 245 10*3/uL (ref 140–400)
RBC: 4.03 10*6/uL — ABNORMAL LOW (ref 4.20–5.82)
RDW: 14.2 % (ref 11.0–14.6)
WBC: 8.3 10*3/uL (ref 4.0–10.3)
lymph#: 1.2 10*3/uL (ref 0.9–3.3)

## 2017-09-22 MED ORDER — SODIUM CHLORIDE 0.9 % IV SOLN: 10.0000 mg | Freq: Once | INTRAVENOUS | Status: DC

## 2017-09-22 MED ORDER — PALONOSETRON HCL INJECTION 0.25 MG/5ML
INTRAVENOUS | Status: AC
  Filled 2017-09-22: qty 5

## 2017-09-22 MED ORDER — SODIUM CHLORIDE 0.9 % IV SOLN
379.5000 mg | Freq: Once | INTRAVENOUS | Status: AC
  Administered 2017-09-22: 380 mg via INTRAVENOUS
  Filled 2017-09-22: qty 38

## 2017-09-22 MED ORDER — DEXAMETHASONE SODIUM PHOSPHATE 10 MG/ML IJ SOLN
10.0000 mg | Freq: Once | INTRAMUSCULAR | Status: AC
  Administered 2017-09-22: 10 mg via INTRAVENOUS

## 2017-09-22 MED ORDER — SODIUM CHLORIDE 0.9 % IV SOLN
Freq: Once | INTRAVENOUS | Status: AC
  Administered 2017-09-22: 14:00:00 via INTRAVENOUS

## 2017-09-22 MED ORDER — PALONOSETRON HCL INJECTION 0.25 MG/5ML
0.2500 mg | Freq: Once | INTRAVENOUS | Status: AC
  Administered 2017-09-22: 0.25 mg via INTRAVENOUS

## 2017-09-22 MED ORDER — DEXAMETHASONE SODIUM PHOSPHATE 10 MG/ML IJ SOLN
INTRAMUSCULAR | Status: AC
  Filled 2017-09-22: qty 1

## 2017-09-22 MED ORDER — SODIUM CHLORIDE 0.9 % IV SOLN
100.0000 mg/m2 | Freq: Once | INTRAVENOUS | Status: AC
  Administered 2017-09-22: 160 mg via INTRAVENOUS
  Filled 2017-09-22: qty 8

## 2017-09-22 NOTE — Patient Instructions (Signed)
[facility] Discharge Instructions for Patients Receiving Chemotherapy  Today you received the following chemotherapy agents CARBOPLATIN,VP-16 To help prevent nausea and vomiting after your treatment, we encourage you to take your nausea medication as prescribed.  If you develop nausea and vomiting that is not controlled by your nausea medication, call the clinic.   BELOW ARE SYMPTOMS THAT SHOULD BE REPORTED IMMEDIATELY:  *FEVER GREATER THAN 100.5 F  *CHILLS WITH OR WITHOUT FEVER  NAUSEA AND VOMITING THAT IS NOT CONTROLLED WITH YOUR NAUSEA MEDICATION  *UNUSUAL SHORTNESS OF BREATH  *UNUSUAL BRUISING OR BLEEDING  TENDERNESS IN MOUTH AND THROAT WITH OR WITHOUT PRESENCE OF ULCERS  *URINARY PROBLEMS  *BOWEL PROBLEMS  UNUSUAL RASH Items with * indicate a potential emergency and should be followed up as soon as possible.  Feel free to call the clinic should you have any questions or concerns. The clinic phone number is [phone].  Please show the [facility] at check-in to the Emergency Department and triage nurse.

## 2017-09-23 ENCOUNTER — Ambulatory Visit (HOSPITAL_BASED_OUTPATIENT_CLINIC_OR_DEPARTMENT_OTHER): Payer: Medicare Other

## 2017-09-23 ENCOUNTER — Ambulatory Visit: Payer: Medicare Other

## 2017-09-23 ENCOUNTER — Encounter (HOSPITAL_COMMUNITY): Payer: Self-pay

## 2017-09-23 VITALS — BP 94/65 | HR 97 | Temp 98.4°F | Resp 18

## 2017-09-23 DIAGNOSIS — Z5111 Encounter for antineoplastic chemotherapy: Secondary | ICD-10-CM | POA: Diagnosis present

## 2017-09-23 DIAGNOSIS — C3492 Malignant neoplasm of unspecified part of left bronchus or lung: Secondary | ICD-10-CM

## 2017-09-23 MED ORDER — SODIUM CHLORIDE 0.9 % IV SOLN: 10.0000 mg | Freq: Once | INTRAVENOUS | Status: DC

## 2017-09-23 MED ORDER — SODIUM CHLORIDE 0.9 % IV SOLN
Freq: Once | INTRAVENOUS | Status: AC
  Administered 2017-09-23: 13:00:00 via INTRAVENOUS

## 2017-09-23 MED ORDER — DEXAMETHASONE SODIUM PHOSPHATE 10 MG/ML IJ SOLN
INTRAMUSCULAR | Status: AC
  Filled 2017-09-23: qty 1

## 2017-09-23 MED ORDER — DEXAMETHASONE SODIUM PHOSPHATE 10 MG/ML IJ SOLN
10.0000 mg | Freq: Once | INTRAMUSCULAR | Status: AC
  Administered 2017-09-23: 10 mg via INTRAVENOUS

## 2017-09-23 MED ORDER — SODIUM CHLORIDE 0.9 % IV SOLN
100.0000 mg/m2 | Freq: Once | INTRAVENOUS | Status: AC
  Administered 2017-09-23: 160 mg via INTRAVENOUS
  Filled 2017-09-23: qty 8

## 2017-09-23 NOTE — Patient Instructions (Signed)
[facility] Discharge Instructions for Patients Receiving Chemotherapy  Today you received the following chemotherapy agents Etoposide  To help prevent nausea and vomiting after your treatment, we encourage you to take your nausea medication as directed  If you develop nausea and vomiting that is not controlled by your nausea medication, call the clinic.   BELOW ARE SYMPTOMS THAT SHOULD BE REPORTED IMMEDIATELY:  *FEVER GREATER THAN 100.5 F  *CHILLS WITH OR WITHOUT FEVER  NAUSEA AND VOMITING THAT IS NOT CONTROLLED WITH YOUR NAUSEA MEDICATION  *UNUSUAL SHORTNESS OF BREATH  *UNUSUAL BRUISING OR BLEEDING  TENDERNESS IN MOUTH AND THROAT WITH OR WITHOUT PRESENCE OF ULCERS  *URINARY PROBLEMS  *BOWEL PROBLEMS  UNUSUAL RASH Items with * indicate a potential emergency and should be followed up as soon as possible.  Feel free to call the clinic should you have any questions or concerns. The clinic phone number is [phone].  Please show the [facility] at check-in to the Emergency Department and triage nurse.    Etoposide, VP-16 capsules What is this medicine? ETOPOSIDE, VP-16 (e toe POE side) is a chemotherapy drug. It is used to treat small cell lung cancer and other cancers. This medicine may be used for other purposes; ask your health care provider or pharmacist if you have questions. COMMON BRAND NAME(S): VePesid What should I tell my health care provider before I take this medicine? They need to know if you have any of these conditions: -infection -kidney disease -liver disease -low blood counts, like low white cell, platelet, or red cell counts -an unusual or allergic reaction to etoposide, other medicines, foods, dyes, or preservatives -pregnant or trying to get pregnant -breast-feeding How should I use this medicine? Take this medicine by mouth with a glass of water. Follow the directions on the prescription label. Do not open, crush, or chew  the capsules. It is advisable to wear gloves when handling this medicine. Take your medicine at regular intervals. Do not take it more often than directed. Do not stop taking except on your doctor's advice. Talk to your pediatrician regarding the use of this medicine in children. Special care may be needed. Overdosage: If you think you have taken too much of this medicine contact a poison control center or emergency room at once. NOTE: This medicine is only for you. Do not share this medicine with others. What if I miss a dose? If you miss a dose, take it as soon as you can. If it is almost time for your next dose, take only that dose. Do not take double or extra doses. What may interact with this medicine? -aspirin -certain medications for seizures like carbamazepine, phenobarbital, phenytoin, valproic acid -cyclosporine -levamisole -valproic acid -warfarin This list may not describe all possible interactions. Give your health care provider a list of all the medicines, herbs, non-prescription drugs, or dietary supplements you use. Also tell them if you smoke, drink alcohol, or use illegal drugs. Some items may interact with your medicine. What should I watch for while using this medicine? Visit your doctor for checks on your progress. This drug may make you feel generally unwell. This is not uncommon, as chemotherapy can affect healthy cells as well as cancer cells. Report any side effects. Continue your course of treatment even though you feel ill unless your doctor tells you to stop. In some cases, you may be given additional medicines to help with side effects. Follow all directions for their use. Call your doctor or  health care professional for advice if you get a fever, chills or sore throat, or other symptoms of a cold or flu. Do not treat yourself. This drug decreases your body's ability to fight infections. Try to avoid being around people who are sick. This medicine may increase your risk  to bruise or bleed. Call your doctor or health care professional if you notice any unusual bleeding. Talk to your doctor about your risk of cancer. You may be more at risk for certain types of cancers if you take this medicine. Do not become pregnant while taking this medicine or for at least 6 months after stopping it. Women should inform their doctor if they wish to become pregnant or think they might be pregnant. Women of child-bearing potential will need to have a negative pregnancy test before starting this medicine. There is a potential for serious side effects to an unborn child. Talk to your health care professional or pharmacist for more information. Do not breast-feed an infant while taking this medicine. Men must use a latex condom during sexual contact with a woman while taking this medicine and for at least 4 months after stopping it. A latex condom is needed even if you have had a vasectomy. Contact your doctor right away if your partner becomes pregnant. Do not donate sperm while taking this medicine and for 4 months after you stop taking this medicine. Men should inform their doctors if they wish to father a child. This medicine may lower sperm counts. What side effects may I notice from receiving this medicine? Side effects that you should report to your doctor or health care professional as soon as possible: -allergic reactions like skin rash, itching or hives, swelling of the face, lips, or tongue -low blood counts - this medicine may decrease the number of white blood cells, red blood cells and platelets. You may be at increased risk for infections and bleeding. -signs of infection - fever or chills, cough, sore throat, pain or difficulty passing urine -signs of decreased platelets or bleeding - bruising, pinpoint red spots on the skin, black, tarry stools, blood in the urine -signs of decreased red blood cells - unusually weak or tired, fainting spells, lightheadedness -breathing  problems -changes in vision -mouth or throat sores or ulcers -pain, tingling, numbness in the hands or feet -redness, blistering, peeling or loosening of the skin, including inside the mouth -seizures -vomiting Side effects that usually do not require medical attention (report to your doctor or health care professional if they continue or are bothersome): -change in taste -diarrhea -hair loss -nausea -stomach pain This list may not describe all possible side effects. Call your doctor for medical advice about side effects. You may report side effects to FDA at 1-800-FDA-1088. Where should I keep my medicine? Keep out of the reach of children. Store in a refrigerator between 2 and 8 degrees C (36 and 46 degrees F). Do not freeze. Throw away any unused medicine after the expiration date. NOTE: This sheet is a summary. It may not cover all possible information. If you have questions about this medicine, talk to your doctor, pharmacist, or health care provider.  2018 Elsevier/Gold Standard (2015-12-01 11:49:52)

## 2017-09-24 ENCOUNTER — Ambulatory Visit (HOSPITAL_BASED_OUTPATIENT_CLINIC_OR_DEPARTMENT_OTHER): Payer: Medicare Other

## 2017-09-24 ENCOUNTER — Other Ambulatory Visit: Payer: Medicare Other

## 2017-09-24 VITALS — BP 98/59 | HR 94 | Temp 98.1°F | Resp 18 | Wt 114.8 lb

## 2017-09-24 DIAGNOSIS — Z5111 Encounter for antineoplastic chemotherapy: Secondary | ICD-10-CM | POA: Diagnosis present

## 2017-09-24 DIAGNOSIS — C3492 Malignant neoplasm of unspecified part of left bronchus or lung: Secondary | ICD-10-CM | POA: Diagnosis not present

## 2017-09-24 DIAGNOSIS — Z5189 Encounter for other specified aftercare: Secondary | ICD-10-CM | POA: Diagnosis not present

## 2017-09-24 MED ORDER — SODIUM CHLORIDE 0.9 % IV SOLN
100.0000 mg/m2 | Freq: Once | INTRAVENOUS | Status: AC
  Administered 2017-09-24: 160 mg via INTRAVENOUS
  Filled 2017-09-24: qty 8

## 2017-09-24 MED ORDER — SODIUM CHLORIDE 0.9 % IV SOLN
Freq: Once | INTRAVENOUS | Status: AC
  Administered 2017-09-24: 13:00:00 via INTRAVENOUS

## 2017-09-24 MED ORDER — PEGFILGRASTIM 6 MG/0.6ML ~~LOC~~ PSKT
6.0000 mg | PREFILLED_SYRINGE | Freq: Once | SUBCUTANEOUS | Status: AC
  Administered 2017-09-24: 6 mg via SUBCUTANEOUS
  Filled 2017-09-24: qty 0.6

## 2017-09-24 MED ORDER — DEXAMETHASONE SODIUM PHOSPHATE 10 MG/ML IJ SOLN
INTRAMUSCULAR | Status: AC
  Filled 2017-09-24: qty 1

## 2017-09-24 MED ORDER — DEXAMETHASONE SODIUM PHOSPHATE 10 MG/ML IJ SOLN
10.0000 mg | Freq: Once | INTRAMUSCULAR | Status: AC
  Administered 2017-09-24: 10 mg via INTRAVENOUS

## 2017-09-24 NOTE — Patient Instructions (Signed)
[facility] Discharge Instructions for Patients Receiving Chemotherapy  Today you received the following chemotherapy agents Etoposide and Neulasta  To help prevent nausea and vomiting after your treatment, we encourage you to take your nausea medication as directed  If you develop nausea and vomiting that is not controlled by your nausea medication, call the clinic.   BELOW ARE SYMPTOMS THAT SHOULD BE REPORTED IMMEDIATELY:  *FEVER GREATER THAN 100.5 F  *CHILLS WITH OR WITHOUT FEVER  NAUSEA AND VOMITING THAT IS NOT CONTROLLED WITH YOUR NAUSEA MEDICATION  *UNUSUAL SHORTNESS OF BREATH  *UNUSUAL BRUISING OR BLEEDING  TENDERNESS IN MOUTH AND THROAT WITH OR WITHOUT PRESENCE OF ULCERS  *URINARY PROBLEMS  *BOWEL PROBLEMS  UNUSUAL RASH Items with * indicate a potential emergency and should be followed up as soon as possible.  Feel free to call the clinic should you have any questions or concerns. The clinic phone number is [phone].  Please show the [facility] at check-in to the Emergency Department and triage nurse.   Pegfilgrastim injection What is this medicine? PEGFILGRASTIM (PEG fil gra stim) is a long-acting granulocyte colony-stimulating factor that stimulates the growth of neutrophils, a type of white blood cell important in the body's fight against infection. It is used to reduce the incidence of fever and infection in patients with certain types of cancer who are receiving chemotherapy that affects the bone marrow, and to increase survival after being exposed to high doses of radiation. This medicine may be used for other purposes; ask your health care provider or pharmacist if you have questions. COMMON BRAND NAME(S): Neulasta What should I tell my health care provider before I take this medicine? They need to know if you have any of these conditions: -kidney disease -latex allergy -ongoing radiation therapy -sickle cell disease -skin  reactions to acrylic adhesives (On-Body Injector only) -an unusual or allergic reaction to pegfilgrastim, filgrastim, other medicines, foods, dyes, or preservatives -pregnant or trying to get pregnant -breast-feeding How should I use this medicine? This medicine is for injection under the skin. If you get this medicine at home, you will be taught how to prepare and give the pre-filled syringe or how to use the On-body Injector. Refer to the patient Instructions for Use for detailed instructions. Use exactly as directed. Tell your healthcare provider immediately if you suspect that the On-body Injector may not have performed as intended or if you suspect the use of the On-body Injector resulted in a missed or partial dose. It is important that you put your used needles and syringes in a special sharps container. Do not put them in a trash can. If you do not have a sharps container, call your pharmacist or healthcare provider to get one. Talk to your pediatrician regarding the use of this medicine in children. While this drug may be prescribed for selected conditions, precautions do apply. Overdosage: If you think you have taken too much of this medicine contact a poison control center or emergency room at once. NOTE: This medicine is only for you. Do not share this medicine with others. What if I miss a dose? It is important not to miss your dose. Call your doctor or health care professional if you miss your dose. If you miss a dose due to an On-body Injector failure or leakage, a new dose should be administered as soon as possible using a single prefilled syringe for manual use. What may interact with this medicine? Interactions have not been studied. Give  your health care provider a list of all the medicines, herbs, non-prescription drugs, or dietary supplements you use. Also tell them if you smoke, drink alcohol, or use illegal drugs. Some items may interact with your medicine. This list may not  describe all possible interactions. Give your health care provider a list of all the medicines, herbs, non-prescription drugs, or dietary supplements you use. Also tell them if you smoke, drink alcohol, or use illegal drugs. Some items may interact with your medicine. What should I watch for while using this medicine? You may need blood work done while you are taking this medicine. If you are going to need a MRI, CT scan, or other procedure, tell your doctor that you are using this medicine (On-Body Injector only). What side effects may I notice from receiving this medicine? Side effects that you should report to your doctor or health care professional as soon as possible: -allergic reactions like skin rash, itching or hives, swelling of the face, lips, or tongue -dizziness -fever -pain, redness, or irritation at site where injected -pinpoint red spots on the skin -red or dark-brown urine -shortness of breath or breathing problems -stomach or side pain, or pain at the shoulder -swelling -tiredness -trouble passing urine or change in the amount of urine Side effects that usually do not require medical attention (report to your doctor or health care professional if they continue or are bothersome): -bone pain -muscle pain This list may not describe all possible side effects. Call your doctor for medical advice about side effects. You may report side effects to FDA at 1-800-FDA-1088. Where should I keep my medicine? Keep out of the reach of children. Store pre-filled syringes in a refrigerator between 2 and 8 degrees C (36 and 46 degrees F). Do not freeze. Keep in carton to protect from light. Throw away this medicine if it is left out of the refrigerator for more than 48 hours. Throw away any unused medicine after the expiration date. NOTE: This sheet is a summary. It may not cover all possible information. If you have questions about this medicine, talk to your doctor, pharmacist, or health  care provider.  2018 Elsevier/Gold Standard (2016-12-05 12:58:03)

## 2017-09-24 NOTE — Progress Notes (Signed)
Neulasta video and instructions reviewed with patient and pt son. Both verbalized understanding.

## 2017-09-25 ENCOUNTER — Encounter (HOSPITAL_COMMUNITY): Payer: Self-pay

## 2017-09-29 ENCOUNTER — Other Ambulatory Visit (HOSPITAL_BASED_OUTPATIENT_CLINIC_OR_DEPARTMENT_OTHER): Payer: Medicare Other

## 2017-09-29 ENCOUNTER — Telehealth: Payer: Self-pay | Admitting: Medical Oncology

## 2017-09-29 ENCOUNTER — Telehealth: Payer: Self-pay | Admitting: Internal Medicine

## 2017-09-29 ENCOUNTER — Other Ambulatory Visit: Payer: Medicare Other

## 2017-09-29 ENCOUNTER — Encounter: Payer: Self-pay | Admitting: Oncology

## 2017-09-29 ENCOUNTER — Ambulatory Visit (HOSPITAL_BASED_OUTPATIENT_CLINIC_OR_DEPARTMENT_OTHER): Payer: Medicare Other | Admitting: Oncology

## 2017-09-29 VITALS — BP 85/52 | HR 102 | Temp 97.9°F | Resp 16 | Ht 69.0 in | Wt 114.1 lb

## 2017-09-29 DIAGNOSIS — C3492 Malignant neoplasm of unspecified part of left bronchus or lung: Secondary | ICD-10-CM

## 2017-09-29 DIAGNOSIS — C3490 Malignant neoplasm of unspecified part of unspecified bronchus or lung: Secondary | ICD-10-CM

## 2017-09-29 LAB — COMPREHENSIVE METABOLIC PANEL
ALT: 13 U/L (ref 0–55)
AST: 29 U/L (ref 5–34)
Albumin: 3.4 g/dL — ABNORMAL LOW (ref 3.5–5.0)
Alkaline Phosphatase: 101 U/L (ref 40–150)
Anion Gap: 7 mEq/L (ref 3–11)
BUN: 14.7 mg/dL (ref 7.0–26.0)
CALCIUM: 9.9 mg/dL (ref 8.4–10.4)
CHLORIDE: 95 meq/L — AB (ref 98–109)
CO2: 38 meq/L — AB (ref 22–29)
CREATININE: 0.8 mg/dL (ref 0.7–1.3)
EGFR: >90 mL/min/{1.73_m2} (ref >90)
GLUCOSE: 110 mg/dL (ref 70–140)
Potassium: 3.5 mEq/L (ref 3.5–5.1)
Sodium: 140 mEq/L (ref 136–145)
Total Bilirubin: 0.64 mg/dL (ref 0.20–1.20)
Total Protein: 6.8 g/dL (ref 6.4–8.3)

## 2017-09-29 LAB — CBC WITH DIFFERENTIAL/PLATELET
BASO%: 0.4 % (ref 0.0–2.0)
Basophils Absolute: 0 10*3/uL (ref 0.0–0.1)
EOS%: 1.7 % (ref 0.0–7.0)
Eosinophils Absolute: 0.1 10*3/uL (ref 0.0–0.5)
HEMATOCRIT: 33.8 % — AB (ref 38.4–49.9)
HEMOGLOBIN: 10.6 g/dL — AB (ref 13.0–17.1)
LYMPH#: 1.3 10*3/uL (ref 0.9–3.3)
LYMPH%: 18.7 % (ref 14.0–49.0)
MCH: 30.5 pg (ref 27.2–33.4)
MCHC: 31.4 g/dL — AB (ref 32.0–36.0)
MCV: 97.4 fL (ref 79.3–98.0)
MONO#: 0.1 10*3/uL (ref 0.1–0.9)
MONO%: 0.9 % (ref 0.0–14.0)
NEUT#: 5.4 10*3/uL (ref 1.5–6.5)
NEUT%: 78.3 % — AB (ref 39.0–75.0)
Platelets: 143 10*3/uL (ref 140–400)
RBC: 3.47 10*6/uL — ABNORMAL LOW (ref 4.20–5.82)
RDW: 13.5 % (ref 11.0–14.6)
WBC: 6.9 10*3/uL (ref 4.0–10.3)

## 2017-09-29 NOTE — Progress Notes (Signed)
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 The patient presented with progressive shortness of breath and weight loss. He has widespread malignancy including his lymph nodes and bones.  SUMMARY OF ONCOLOGIC HISTORY:  No history exists.    CURRENT THERAPY: Carboplatin for an AUC of 5 with etoposide 100 mg meter squared started 09/22/2017. The patient is status post 1 cycle.  INTERVAL HISTORY: Jose Mckinney 71 y.o. male returns for routine follow-up with his family members. The patient reports he tolerated his chemotherapy well overall. He had very minimal nausea. He uses Compazine as needed which is effective. He thinks his breathing may have improved some. Denies fevers and chills. Denies chest pain, shortness of breath at rest, hemoptysis. He has dyspnea on exertion remains on O2 at 2 L. He has a nonproductive cough. He denies nausea, vomiting, diarrhea, constipation today. The patient is here for evaluation and management of side effects related to therapy.   Patient Active Problem List   Diagnosis Date Noted  . Small cell lung cancer, left ([place]) 09/15/2017  . Goals of care, counseling/discussion 09/15/2017  . Encounter for antineoplastic chemotherapy 09/15/2017  . Protein calorie malnutrition ([place]) 09/15/2017  . Lung mass 09/11/2017  . Cor pulmonale, chronic ([place]) 07/15/2017  . 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 LBP (low back pain) 02/17/2015  . Pulmonary hypertension ([place]) 02/17/2015  . 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 Chronic obstructive pulmonary disease ([place]) 11/09/2014  . 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 Bilateral pleural effusion 06/10/2014  . Essential (primary) hypertension 05/17/2014    has No Known Allergies.  MEDICAL HISTORY: Past Medical History:  Diagnosis Date  .  Anxiety   . CHF (congestive heart failure) (HCC)    x 3 episodes- [place]. pt. living here since 11-'1-16  . COPD with emphysema ([place])   . Depression   . Dyspnea   . HTN (hypertension)   . On home oxygen therapy   . Pulmonary HTN (HCC)    Dr. Marlyn Corporal is following.  . Recurrent left pleural effusion   . Sciatic leg pain    INTERMITTENT- occ. uses Hydrocodone as needed  . Seasonal allergies   . Small cell lung cancer, left ([place]) 09/15/2017    SURGICAL HISTORY: Past Surgical History:  Procedure Laterality Date  . ANGIOPLASTY    . CARDIAC CATHETERIZATION     '15- [place]- no issues now.  . COLONOSCOPY WITH PROPOFOL N/A 02/09/2016   Procedure: COLONOSCOPY WITH PROPOFOL;  Surgeon: Carol Ada, MD;  Location: [facility];  Service: Endoscopy;  Laterality: N/A;  . IR THORACENTESIS ASP PLEURAL SPACE W/IMG GUIDE  08/08/2017  . MASTOIDECTOMY Left   . TONSILLECTOMY      SOCIAL HISTORY: Social History   Social History  . Marital status: Married    Spouse name: N/A  . Number of children: N/A  . Years of education: N/A   Occupational History  . Retired    Social History Main Topics  . Smoking status: Former Smoker    Packs/day: 2.50    Years: 50.00    Types: Cigarettes    Quit date: 07/10/2014  . Smokeless tobacco: Never Used     Comment: Smoked 2.5-3 PPD at highest amount.   . Alcohol use 0.0 oz/week     Comment: 36 ozs of beer daily   .  Drug use: No  . Sexual activity: Not on file   Other Topics Concern  . Not on file   Social History Narrative   Patient is adopted -- UNKNOWN family history    FAMILY HISTORY: Family History  Problem Relation Age of Onset  . Adopted: Yes  . Family history unknown: Yes    Review of Systems  Constitutional: Positive for fatigue. Negative for chills, fever and unexpected weight change.  HENT:  Negative.   Eyes: Negative.   Respiratory: Positive for cough. Negative for hemoptysis.        Dyspnea on exertion.   Cardiovascular: Negative.   Gastrointestinal: Negative.   Genitourinary: Negative.    Musculoskeletal: Negative.   Skin: Negative.   Neurological: Negative.   Psychiatric/Behavioral: Negative.       PHYSICAL EXAMINATION  ECOG PERFORMANCE STATUS: 1 - Symptomatic but completely ambulatory  Vitals:   09/29/17 1116  BP: (!) 85/52  Pulse: (!) 102  Resp: 16  Temp: 97.9 F (36.6 C)  SpO2: 96%    Physical Exam  Constitutional: He is oriented to person, place, and time.  Thin, frail male who is in no acute distress.  HENT:  Head: Normocephalic.  Mouth/Throat: Oropharynx is clear and moist. No oropharyngeal exudate.  Eyes: Conjunctivae are normal. Right eye exhibits no discharge. Left eye exhibits no discharge. No scleral icterus.  Neck: Normal range of motion. Neck supple.  Cardiovascular: Normal rate, regular rhythm, normal heart sounds and intact distal pulses.   Pulmonary/Chest: Effort normal. No respiratory distress. He has no wheezes. He has no rales.  Diminished breath sounds bilaterally.  Abdominal: Soft. Bowel sounds are normal. He exhibits no distension and no mass. There is no tenderness.  Musculoskeletal: Normal range of motion. He exhibits no edema.  Lymphadenopathy:    He has no cervical adenopathy.  Neurological: He is alert and oriented to person, place, and time. He exhibits normal muscle tone. Coordination normal.  Skin: Skin is warm and dry. No rash noted. No erythema. No pallor.  Psychiatric: Mood, memory, affect and judgment normal.  Vitals reviewed.   LABORATORY DATA:  CBC    Component Value Date/Time   WBC 6.9 09/29/2017 1100   WBC 7.7 09/04/2017 1145   RBC 3.47 (L) 09/29/2017 1100   RBC 4.01 (L) 09/04/2017 1145   HGB 10.6 (L) 09/29/2017 1100   HCT 33.8 (L) 09/29/2017 1100   PLT 143 09/29/2017 1100   MCV 97.4 09/29/2017 1100   MCH 30.5 09/29/2017 1100   MCH 30.2 09/04/2017 1145   MCHC 31.4 (L) 09/29/2017 1100   MCHC 32.8 09/04/2017 1145    RDW 13.5 09/29/2017 1100   LYMPHSABS 1.3 09/29/2017 1100   MONOABS 0.1 09/29/2017 1100   EOSABS 0.1 09/29/2017 1100   BASOSABS 0.0 09/29/2017 1100    CMP     Component Value Date/Time   NA 140 09/29/2017 1100   K 3.5 09/29/2017 1100   CL 95 (L) 09/04/2017 1145   CO2 38 (H) 09/29/2017 1100   GLUCOSE 110 09/29/2017 1100   BUN 14.7 09/29/2017 1100   CREATININE 0.8 09/29/2017 1100   CALCIUM 9.9 09/29/2017 1100   PROT 6.8 09/29/2017 1100   ALBUMIN 3.4 (L) 09/29/2017 1100   AST 29 09/29/2017 1100   ALT 13 09/29/2017 1100   ALKPHOS 101 09/29/2017 1100   BILITOT 0.64 09/29/2017 1100   GFRNONAA >60 09/04/2017 1145   GFRAA >60 09/04/2017 1145     RADIOGRAPHIC STUDIES:  Mr Jeri Cos Wo Contrast  Result Date: 09/19/2017 CLINICAL DATA:  Small cell lung cancer, here for staging. History of hypertension. EXAM: MRI HEAD WITHOUT AND WITH CONTRAST TECHNIQUE: Multiplanar, multiecho pulse sequences of the brain and surrounding structures were obtained without and with intravenous contrast. CONTRAST:  37mL MULTIHANCE GADOBENATE DIMEGLUMINE 529 MG/ML IV SOLN COMPARISON:  None. FINDINGS: Mildly motion degraded examination. INTRACRANIAL CONTENTS: No reduced diffusion to suggest acute ischemia or hypercellular tumor. No susceptibility artifact to suggest hemorrhage. The ventricles and sulci are normal for patient's age. Patchy to confluent supratentorial pontine white matter FLAIR T2 hyperintensities. No suspicious parenchymal signal, masses, mass effect. No abnormal intraparenchymal or extra-axial enhancement. LEFT frontal developmental venous anomaly, benign finding. No abnormal extra-axial fluid collections. No extra-axial masses. VASCULAR: Major intracranial vascular flow voids present at skull base, umbilical ectasia seen with chronic hypertension. SKULL AND UPPER CERVICAL SPINE: No abnormal sellar expansion. No suspicious calvarial bone marrow signal. Craniocervical junction maintained. SINUSES/ORBITS:  The mastoid air-cells and included paranasal sinuses are well-aerated.The included ocular globes and orbital contents are non-suspicious. OTHER: Patient is edentulous. IMPRESSION: No acute intracranial process or metastasis on this motion degraded examination. Moderate to severe chronic small vessel ischemic disease. Electronically Signed   By: Elon Alas M.D.   On: 09/19/2017 21:42   [place] Core Biopsy (lymph Nodes)  Result Date: 09/04/2017 INDICATION: Left supraclavicular adenopathy EXAM: ULTRASOUND GUIDED CORE BIOPSY OF LEFT SUPRACLAVICULAR LYMPH NODE MEDICATIONS: None. ANESTHESIA/SEDATION: Fentanyl 100 mcg IV; Versed 2 mg IV Moderate Sedation Time:  10 The patient was continuously monitored during the procedure by the interventional radiology nurse under my direct supervision. PROCEDURE: The procedure, risks, benefits, and alternatives were explained to the patient. Questions regarding the procedure were encouraged and answered. The patient understands and consents to the procedure. The left supraclavicular lesion was prepped with ChloraPrep in a sterile fashion, and a sterile drape was applied covering the operative field. A sterile gown and sterile gloves were used for the procedure. Local anesthesia was provided with 1% Lidocaine. Under sonographic guidance, 5 18 gauge core biopsies of the enlarged left supraclavicular lymph node were obtained and placed in saline. COMPLICATIONS: None immediate. FINDINGS: Imaging documents needle placement in the left supraclavicular lymph node. Final imaging demonstrates no evidence of hemorrhage. IMPRESSION: Successful ultrasound-guided core biopsy of an enlarged left supraclavicular lymph node. Electronically Signed   By: Marybelle Killings M.D.   On: 09/04/2017 15:19    ASSESSMENT and THERAPY PLAN:   Small cell lung cancer, left [facility]) This is a pleasant 71 year old white male diagnosed with extensive stage small cell lung cancer in September 2018. The patient  presented with progressive shortness of breath and weight loss. He has widespread malignancy including his lymph nodes and bones.  The patient is currently on systemic chemotherapy consisting of carboplatin for an AUC of 5 with etoposide 100 mg/m. The patient is status post 1 cycle which he tolerated well.    The patient was seen with Dr. Julien Nordmann. Recommend the patient continue with weekly labs. Continue antiemetics as needed.The patient will return in 2 weeks for evaluation prior to cycle 2 of his chemotherapy.   No orders of the defined types were placed in this encounter.   All questions were answered. The patient knows to call the clinic with any problems, questions or concerns. We can certainly see the patient much sooner if necessary.  Mikey Bussing, NP 09/29/2017   ADDENDUM: Hematology/Oncology Attending: I had a face to face encounter with the patient. I recommended his care plan. This is  a very pleasant 71 years old white male recently diagnosed with extensive stage small cell lung cancer and currently undergoing systemic chemotherapy with carboplatin and etoposide status post 1 cycle given last week. The patient tolerated the first week of his treatment fairly well with no significant adverse effects. He started noticing some improvement in his breathing. He denied having any current nausea, vomiting, diarrhea or constipation. He has no chest pain but continues to have the baseline shortness of breath and he is currently on home oxygen. I recommended for the patient to proceed with cycle #2 when 2 weeks as scheduled. We will see him back for follow-up visit at that time. He was given neutropenic precautions and was advised to be careful with any sick people around. He was advised to call immediately if he has any concerning symptoms in the interval.  Disclaimer: This note was dictated with voice recognition software. Similar sounding words can inadvertently be transcribed and may  be missed upon review. Eilleen Kempf, MD 09/29/17

## 2017-09-29 NOTE — Assessment & Plan Note (Signed)
This is a pleasant 71 year old white male diagnosed with extensive stage small cell lung cancer in September 2018. The patient presented with progressive shortness of breath and weight loss. He has widespread malignancy including his lymph nodes and bones.  The patient is currently on systemic chemotherapy consisting of carboplatin for an AUC of 5 with etoposide 100 mg/m. The patient is status post 1 cycle which he tolerated well.    The patient was seen with Dr. Julien Nordmann. Recommend the patient continue with weekly labs. Continue antiemetics as needed.The patient will return in 2 weeks for evaluation prior to cycle 2 of his chemotherapy.

## 2017-09-29 NOTE — Telephone Encounter (Signed)
Cancelled 10/15 nutrition appointment and added q3w f/u appointments per 10/8 los. Patient patient already on schedule for treatment appointments and weekly labs.   Left message for patient re above and confirmed next two appointments for 10/15 and 10/22. Patient to get updated schedule 10/15.

## 2017-09-29 NOTE — Telephone Encounter (Signed)
Does he need to take "water pill?'. Per Cyril Mourning , wife instructed to hold Demadex today due to low BP and f/u with cardiologist for further instructions.

## 2017-09-30 ENCOUNTER — Encounter (HOSPITAL_COMMUNITY): Payer: Self-pay

## 2017-10-01 ENCOUNTER — Telehealth: Payer: Self-pay | Admitting: Cardiovascular Disease

## 2017-10-01 NOTE — Telephone Encounter (Signed)
Left message to call back  

## 2017-10-01 NOTE — Telephone Encounter (Signed)
Pt c/o medication issue:  1. Name of Medication: torsimide  2. How are you currently taking this medication (dosage and times per day)? Unknown   3. Are you having a reaction (difficulty breathing--STAT)? no  4. What is your medication issue? Low bp

## 2017-10-02 ENCOUNTER — Encounter (HOSPITAL_COMMUNITY): Payer: Self-pay

## 2017-10-02 NOTE — Telephone Encounter (Signed)
New Message   pt wife verbalized that she is returning call for rn   Its about his torsimide   She want to know if pt can take it pt not take it: P wife want this call routed to Carol/Scott

## 2017-10-02 NOTE — Telephone Encounter (Signed)
Follow up     Please call pt wife back, she is returning your call

## 2017-10-02 NOTE — Telephone Encounter (Signed)
I s/w pt's wife DPR who has concerns about pt's BP and Torsemide. Pt saw Oncologist 10/8 with BP 85/52 and was advised to contact Cardiology for instructions as to if he should or should not take his Torsemide. Pt's wife states pt has not taken his Torsemide this week and would like further advice from [place], [place]. Pt denies any increased edema or sob, pt states his weight has been fine with no increase in weight gain. BP today was 103/78. I advised pt's wife that Richardson Dopp, [place] is out of the office though he will be back tomorrow. Advised I will call back once I have new recommendations. Pt's wife thanked me for our help.

## 2017-10-02 NOTE — Telephone Encounter (Signed)
F/u Message  Pt states she is all back to f/u with RN .please call back to discuss

## 2017-10-03 MED ORDER — TORSEMIDE 20 MG PO TABS: 10.0000 mg | ORAL_TABLET | ORAL | Status: DC | PRN

## 2017-10-03 NOTE — Telephone Encounter (Signed)
See note from yesterday 10/02/17. I s/w pt's wife (DPR) today and went over the recommendations per Richardson Dopp, PAC. Recommendation is to hold Torsemide and take PRN. Pt can take Torsemide 10 mg if his weight is up 3 lb's x 1 day or 5 lb's x 1 week, increased swelling or increased sob. Pt's wife thanked me for my call back and our help.

## 2017-10-03 NOTE — Telephone Encounter (Signed)
Ok to hold Torsemide and take only if needed.  If he gains more than 3 lbs in 1 day or more than 5 lbs in 1 week and feels more short of breath or notices increased swelling, he can take a Torsemide 10 mg that day.   Richardson Dopp, PA-C    10/03/2017 2:36 PM

## 2017-10-06 ENCOUNTER — Other Ambulatory Visit (HOSPITAL_BASED_OUTPATIENT_CLINIC_OR_DEPARTMENT_OTHER): Payer: Medicare Other

## 2017-10-06 ENCOUNTER — Encounter: Payer: Medicare Other | Admitting: Nutrition

## 2017-10-06 DIAGNOSIS — C3492 Malignant neoplasm of unspecified part of left bronchus or lung: Secondary | ICD-10-CM

## 2017-10-06 DIAGNOSIS — C3490 Malignant neoplasm of unspecified part of unspecified bronchus or lung: Secondary | ICD-10-CM

## 2017-10-06 LAB — COMPREHENSIVE METABOLIC PANEL
ALT: 17 U/L (ref 0–55)
ANION GAP: 9 meq/L (ref 3–11)
AST: 22 U/L (ref 5–34)
Albumin: 3.5 g/dL (ref 3.5–5.0)
Alkaline Phosphatase: 224 U/L — ABNORMAL HIGH (ref 40–150)
BUN: 9.2 mg/dL (ref 7.0–26.0)
CALCIUM: 9.8 mg/dL (ref 8.4–10.4)
CHLORIDE: 97 meq/L — AB (ref 98–109)
CO2: 34 mEq/L — ABNORMAL HIGH (ref 22–29)
Creatinine: 0.8 mg/dL (ref 0.7–1.3)
EGFR: >60 mL/min/{1.73_m2} (ref >60)
Glucose: 100 mg/dl (ref 70–140)
POTASSIUM: 3.7 meq/L (ref 3.5–5.1)
Sodium: 140 mEq/L (ref 136–145)
Total Bilirubin: <0.22 mg/dL (ref 0.20–1.20)
Total Protein: 6.7 g/dL (ref 6.4–8.3)

## 2017-10-06 LAB — CBC WITH DIFFERENTIAL/PLATELET
BASO%: 0.1 % (ref 0.0–2.0)
BASOS ABS: 0 10*3/uL (ref 0.0–0.1)
EOS%: 0.4 % (ref 0.0–7.0)
Eosinophils Absolute: 0.1 10*3/uL (ref 0.0–0.5)
HEMATOCRIT: 28.6 % — AB (ref 38.4–49.9)
HGB: 9.1 g/dL — ABNORMAL LOW (ref 13.0–17.1)
LYMPH#: 2.2 10*3/uL (ref 0.9–3.3)
LYMPH%: 14.3 % (ref 14.0–49.0)
MCH: 30.4 pg (ref 27.2–33.4)
MCHC: 31.8 g/dL — AB (ref 32.0–36.0)
MCV: 95.7 fL (ref 79.3–98.0)
MONO#: 1 10*3/uL — ABNORMAL HIGH (ref 0.1–0.9)
MONO%: 6.1 % (ref 0.0–14.0)
NEUT#: 12.3 10*3/uL — ABNORMAL HIGH (ref 1.5–6.5)
NEUT%: 79.1 % — AB (ref 39.0–75.0)
Platelets: 44 10*3/uL — ABNORMAL LOW (ref 140–400)
RBC: 2.99 10*6/uL — ABNORMAL LOW (ref 4.20–5.82)
RDW: 13.8 % (ref 11.0–14.6)
WBC: 15.6 10*3/uL — ABNORMAL HIGH (ref 4.0–10.3)

## 2017-10-07 ENCOUNTER — Other Ambulatory Visit: Payer: Self-pay | Admitting: *Deleted

## 2017-10-07 ENCOUNTER — Encounter (HOSPITAL_COMMUNITY): Payer: Self-pay

## 2017-10-07 MED ORDER — DRONABINOL 2.5 MG PO CAPS: 2.5000 mg | ORAL_CAPSULE | Freq: Two times a day (BID) | ORAL | 0 refills | Status: AC

## 2017-10-09 ENCOUNTER — Encounter (HOSPITAL_COMMUNITY): Payer: Self-pay

## 2017-10-13 ENCOUNTER — Ambulatory Visit (HOSPITAL_BASED_OUTPATIENT_CLINIC_OR_DEPARTMENT_OTHER): Payer: Medicare Other

## 2017-10-13 ENCOUNTER — Ambulatory Visit (HOSPITAL_BASED_OUTPATIENT_CLINIC_OR_DEPARTMENT_OTHER): Payer: Medicare Other | Admitting: Oncology

## 2017-10-13 ENCOUNTER — Telehealth: Payer: Self-pay | Admitting: Oncology

## 2017-10-13 ENCOUNTER — Other Ambulatory Visit (HOSPITAL_BASED_OUTPATIENT_CLINIC_OR_DEPARTMENT_OTHER): Payer: Medicare Other

## 2017-10-13 ENCOUNTER — Encounter: Payer: Self-pay | Admitting: Oncology

## 2017-10-13 VITALS — BP 105/52 | HR 109 | Temp 98.5°F | Resp 17 | Ht 69.0 in | Wt 114.9 lb

## 2017-10-13 DIAGNOSIS — C7951 Secondary malignant neoplasm of bone: Secondary | ICD-10-CM

## 2017-10-13 DIAGNOSIS — Z5111 Encounter for antineoplastic chemotherapy: Secondary | ICD-10-CM

## 2017-10-13 DIAGNOSIS — Z23 Encounter for immunization: Secondary | ICD-10-CM

## 2017-10-13 DIAGNOSIS — D6481 Anemia due to antineoplastic chemotherapy: Secondary | ICD-10-CM | POA: Diagnosis not present

## 2017-10-13 DIAGNOSIS — E46 Unspecified protein-calorie malnutrition: Secondary | ICD-10-CM | POA: Diagnosis not present

## 2017-10-13 DIAGNOSIS — C3492 Malignant neoplasm of unspecified part of left bronchus or lung: Secondary | ICD-10-CM

## 2017-10-13 DIAGNOSIS — T451X5A Adverse effect of antineoplastic and immunosuppressive drugs, initial encounter: Secondary | ICD-10-CM

## 2017-10-13 DIAGNOSIS — C3490 Malignant neoplasm of unspecified part of unspecified bronchus or lung: Secondary | ICD-10-CM

## 2017-10-13 LAB — CBC WITH DIFFERENTIAL/PLATELET
BASO%: 0.1 % (ref 0.0–2.0)
BASOS ABS: 0 10*3/uL (ref 0.0–0.1)
EOS%: 0.2 % (ref 0.0–7.0)
Eosinophils Absolute: 0 10*3/uL (ref 0.0–0.5)
HCT: 29.4 % — ABNORMAL LOW (ref 38.4–49.9)
HGB: 9.9 g/dL — ABNORMAL LOW (ref 13.0–17.1)
LYMPH%: 13.4 % — AB (ref 14.0–49.0)
MCH: 30.9 pg (ref 27.2–33.4)
MCHC: 33.5 g/dL (ref 32.0–36.0)
MCV: 92.1 fL (ref 79.3–98.0)
MONO#: 1.2 10*3/uL — ABNORMAL HIGH (ref 0.1–0.9)
MONO%: 6 % (ref 0.0–14.0)
NEUT#: 16.3 10*3/uL — ABNORMAL HIGH (ref 1.5–6.5)
NEUT%: 80.3 % — AB (ref 39.0–75.0)
Platelets: 453 10*3/uL — ABNORMAL HIGH (ref 140–400)
RBC: 3.19 10*6/uL — AB (ref 4.20–5.82)
RDW: 14.1 % (ref 11.0–14.6)
WBC: 20.3 10*3/uL — ABNORMAL HIGH (ref 4.0–10.3)
lymph#: 2.7 10*3/uL (ref 0.9–3.3)

## 2017-10-13 LAB — COMPREHENSIVE METABOLIC PANEL
ALT: 13 U/L (ref 0–55)
AST: 21 U/L (ref 5–34)
Albumin: 3.6 g/dL (ref 3.5–5.0)
Alkaline Phosphatase: 188 U/L — ABNORMAL HIGH (ref 40–150)
Anion Gap: 9 mEq/L (ref 3–11)
BUN: 13.3 mg/dL (ref 7.0–26.0)
CALCIUM: 9.7 mg/dL (ref 8.4–10.4)
CHLORIDE: 98 meq/L (ref 98–109)
CO2: 31 meq/L — AB (ref 22–29)
CREATININE: 0.8 mg/dL (ref 0.7–1.3)
EGFR: >60 mL/min/{1.73_m2} (ref >60)
Glucose: 94 mg/dl (ref 70–140)
POTASSIUM: 4.4 meq/L (ref 3.5–5.1)
Sodium: 138 mEq/L (ref 136–145)
Total Bilirubin: <0.22 mg/dL (ref 0.20–1.20)
Total Protein: 7.3 g/dL (ref 6.4–8.3)

## 2017-10-13 MED ORDER — SODIUM CHLORIDE 0.9 % IV SOLN
376.0000 mg | Freq: Once | INTRAVENOUS | Status: AC
  Administered 2017-10-13: 380 mg via INTRAVENOUS
  Filled 2017-10-13: qty 38

## 2017-10-13 MED ORDER — INFLUENZA VAC SPLIT QUAD 0.5 ML IM SUSY
0.5000 mL | PREFILLED_SYRINGE | Freq: Once | INTRAMUSCULAR | Status: AC
  Administered 2017-10-13: 0.5 mL via INTRAMUSCULAR
  Filled 2017-10-13: qty 0.5

## 2017-10-13 MED ORDER — PALONOSETRON HCL INJECTION 0.25 MG/5ML
INTRAVENOUS | Status: AC
  Filled 2017-10-13: qty 5

## 2017-10-13 MED ORDER — DEXAMETHASONE SODIUM PHOSPHATE 10 MG/ML IJ SOLN
10.0000 mg | Freq: Once | INTRAMUSCULAR | Status: AC
  Administered 2017-10-13: 10 mg via INTRAVENOUS

## 2017-10-13 MED ORDER — SODIUM CHLORIDE 0.9 % IV SOLN
100.0000 mg/m2 | Freq: Once | INTRAVENOUS | Status: AC
  Administered 2017-10-13: 160 mg via INTRAVENOUS
  Filled 2017-10-13: qty 8

## 2017-10-13 MED ORDER — SODIUM CHLORIDE 0.9 % IV SOLN
Freq: Once | INTRAVENOUS | Status: AC
Start: 2017-10-13 — End: 2017-10-13
  Administered 2017-10-13: 11:00:00 via INTRAVENOUS

## 2017-10-13 MED ORDER — DEXAMETHASONE SODIUM PHOSPHATE 10 MG/ML IJ SOLN
INTRAMUSCULAR | Status: AC
  Filled 2017-10-13: qty 1

## 2017-10-13 MED ORDER — PALONOSETRON HCL INJECTION 0.25 MG/5ML
0.2500 mg | Freq: Once | INTRAVENOUS | Status: AC
  Administered 2017-10-13: 0.25 mg via INTRAVENOUS

## 2017-10-13 NOTE — Patient Instructions (Signed)
[facility] Discharge Instructions for Patients Receiving Chemotherapy  Today you received the following chemotherapy agents: Carboplatin and Etoposide.  To help prevent nausea and vomiting after your treatment, we encourage you to take your nausea medication as directed by your doctor.   If you develop nausea and vomiting that is not controlled by your nausea medication, call the clinic.   BELOW ARE SYMPTOMS THAT SHOULD BE REPORTED IMMEDIATELY:  *FEVER GREATER THAN 100.5 F  *CHILLS WITH OR WITHOUT FEVER  NAUSEA AND VOMITING THAT IS NOT CONTROLLED WITH YOUR NAUSEA MEDICATION  *UNUSUAL SHORTNESS OF BREATH  *UNUSUAL BRUISING OR BLEEDING  TENDERNESS IN MOUTH AND THROAT WITH OR WITHOUT PRESENCE OF ULCERS  *URINARY PROBLEMS  *BOWEL PROBLEMS  UNUSUAL RASH Items with * indicate a potential emergency and should be followed up as soon as possible.  Feel free to call the clinic should you have any questions or concerns. The clinic phone number is [phone].  Please show the [place] at check-in to the Emergency Department and triage nurse.

## 2017-10-13 NOTE — Assessment & Plan Note (Signed)
The patient's weight is stable. He will continue Marinol 2.5 mg twice a day.

## 2017-10-13 NOTE — Assessment & Plan Note (Signed)
This is a pleasant 71 year old white male diagnosed with extensive stage small cell lung cancer in September 2018. The patient presented with progressive shortness of breath and weight loss. He has widespread malignancy including his lymph nodes and bones.  Recommend that he proceed with cycle 2 of systemic chemotherapy consisting of carboplatin for an AUC of 5 with etoposide 100 mg/m today as scheduled.   Recommend the patient continue with weekly labs. Continue antiemetics as needed.The patient will have a restaging CT scan of the chest, abdomen, and pelvis prior to his next visit. The patient will return in 3 weeks for evaluation prior to cycle 3 of his chemotherapy and to review his restaging CT scan results.

## 2017-10-13 NOTE — Assessment & Plan Note (Signed)
The patient's hemoglobin today is 9.9. Anemia is related to his chemotherapy. We will follow this closely and transfuse if his hemoglobin drops to 8.0 or less.

## 2017-10-13 NOTE — Telephone Encounter (Signed)
Per 10/22 no los 

## 2017-10-13 NOTE — Progress Notes (Signed)
[facility] Follow up:    [name], PA-C [address] [place] [address]   DIAGNOSIS: Extensive stage small cell lung cancer in September 2018. The patient presented with progressive shortness of breath and weight loss. He has widespread malignancy including his lymph nodes and bones.  SUMMARY OF ONCOLOGIC HISTORY:  No history exists.    CURRENT THERAPY:  Carboplatin for an AUC of 5 with etoposide 100 mg meter squared started 09/22/2017. The patient is status post 1 cycle.  INTERVAL HISTORY: Jose Mckinney 71 y.o. male returns for routine follow-up with his family members. The patient is feeling fine today with the exception of fatigue. He had very minimal nausea following his last cycle of chemotherapy and has no nausea today. He uses Compazine as needed which is effective. He thinks his breathing is at baseline. Denies fevers and chills. Denies chest pain, shortness of breath at rest, hemoptysis. He has dyspnea on exertion remains on O2 at 2 L. He has a nonproductive cough. He denies nausea, vomiting, diarrhea, constipation today. Appetite is fair and he has not lost any more weight. The patient is here for evaluation prior to cycle 2 of his chemotherapy.   Patient Active Problem List   Diagnosis Date Noted  . Anemia due to antineoplastic chemotherapy 10/13/2017  . Need for prophylactic vaccination and inoculation against influenza 10/13/2017  . Small cell lung cancer, left ([place]) 09/15/2017  . Goals of care, counseling/discussion 09/15/2017  . Encounter for antineoplastic chemotherapy 09/15/2017  . Protein calorie malnutrition ([place]) 09/15/2017  . Lung mass 09/11/2017  . Cor pulmonale, chronic ([place]) 07/15/2017  . 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 LBP (low back pain) 02/17/2015  . Pulmonary hypertension ([place]) 02/17/2015  . 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 Chronic obstructive pulmonary disease ([place])  11/09/2014  . 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 Bilateral pleural effusion 06/10/2014  . Essential (primary) hypertension 05/17/2014    has No Known Allergies.  MEDICAL HISTORY: Past Medical History:  Diagnosis Date  . Anxiety   . CHF (congestive heart failure) (HCC)    x 3 episodes- [place]. pt. living here since 11-'1-16  . COPD with emphysema ([place])   . Depression   . Dyspnea   . HTN (hypertension)   . On home oxygen therapy   . Pulmonary HTN (HCC)    Dr. Marlyn Corporal is following.  . Recurrent left pleural effusion   . Sciatic leg pain    INTERMITTENT- occ. uses Hydrocodone as needed  . Seasonal allergies   . Small cell lung cancer, left ([place]) 09/15/2017    SURGICAL HISTORY: Past Surgical History:  Procedure Laterality Date  . ANGIOPLASTY    . CARDIAC CATHETERIZATION     '15- [place]- no issues now.  . COLONOSCOPY WITH PROPOFOL N/A 02/09/2016   Procedure: COLONOSCOPY WITH PROPOFOL;  Surgeon: Carol Ada, MD;  Location: [facility];  Service: Endoscopy;  Laterality: N/A;  . IR THORACENTESIS ASP PLEURAL SPACE W/IMG GUIDE  08/08/2017  . MASTOIDECTOMY Left   . TONSILLECTOMY      SOCIAL HISTORY: Social History   Social History  . Marital status: Married    Spouse name: N/A  . Number of children: N/A  . Years of education: N/A   Occupational History  . Retired    Social History Main Topics  . Smoking status: Former Smoker    Packs/day: 2.50    Years: 50.00  Types: Cigarettes    Quit date: 07/10/2014  . Smokeless tobacco: Never Used     Comment: Smoked 2.5-3 PPD at highest amount.   . Alcohol use 0.0 oz/week     Comment: 36 ozs of beer daily   . Drug use: No  . Sexual activity: Not on file   Other Topics Concern  . Not on file   Social History Narrative   Patient is adopted -- UNKNOWN family history    FAMILY HISTORY: Family History  Problem Relation Age of Onset  . Adopted: Yes  . Family history unknown: Yes    Review  of Systems  Constitutional: Positive for fatigue. Negative for appetite change, chills, fever and unexpected weight change.  HENT:  Negative.   Eyes: Negative.   Respiratory: Positive for cough. Negative for hemoptysis.        Shortness of breath with exertion  Cardiovascular: Negative.   Gastrointestinal: Negative.   Genitourinary: Negative.    Musculoskeletal: Negative.   Skin: Negative.   Neurological: Negative.   Hematological: Negative.   Psychiatric/Behavioral: Negative.       PHYSICAL EXAMINATION  ECOG PERFORMANCE STATUS: 1 - Symptomatic but completely ambulatory  Vitals:   10/13/17 1008  BP: (!) 105/52  Pulse: (!) 109  Resp: 17  Temp: 98.5 F (36.9 C)  SpO2: 92%    Physical Exam  Constitutional: He is oriented to person, place, and time.  Thin, frail male who is in no acute distress.  HENT:  Head: Normocephalic and atraumatic.  Mouth/Throat: Oropharynx is clear and moist. No oropharyngeal exudate.  Eyes: Conjunctivae are normal. Right eye exhibits no discharge. Left eye exhibits no discharge. No scleral icterus.  Neck: Normal range of motion. Neck supple.  Cardiovascular: Normal rate, regular rhythm, normal heart sounds and intact distal pulses.   Pulmonary/Chest: Effort normal. No respiratory distress. He has no wheezes. He has no rales.  Diminished breath sounds bilaterally.  Abdominal: Soft. Bowel sounds are normal. He exhibits no distension and no mass. There is no tenderness.  Musculoskeletal: Normal range of motion. He exhibits no edema.  Lymphadenopathy:    He has no cervical adenopathy.  Neurological: He is alert and oriented to person, place, and time. He exhibits normal muscle tone. Gait normal. Coordination normal.  Skin: Skin is warm and dry. No rash noted. No erythema. No pallor.  Psychiatric: Mood, affect and judgment normal.  Vitals reviewed.   LABORATORY DATA:  CBC    Component Value Date/Time   WBC 20.3 (H) 10/13/2017 0944   WBC 7.7  09/04/2017 1145   RBC 3.19 (L) 10/13/2017 0944   RBC 4.01 (L) 09/04/2017 1145   HGB 9.9 (L) 10/13/2017 0944   HCT 29.4 (L) 10/13/2017 0944   PLT 453 (H) 10/13/2017 0944   MCV 92.1 10/13/2017 0944   MCH 30.9 10/13/2017 0944   MCH 30.2 09/04/2017 1145   MCHC 33.5 10/13/2017 0944   MCHC 32.8 09/04/2017 1145   RDW 14.1 10/13/2017 0944   LYMPHSABS 2.7 10/13/2017 0944   MONOABS 1.2 (H) 10/13/2017 0944   EOSABS 0.0 10/13/2017 0944   BASOSABS 0.0 10/13/2017 0944    CMP     Component Value Date/Time   NA 138 10/13/2017 0944   K 4.4 10/13/2017 0944   CL 95 (L) 09/04/2017 1145   CO2 31 (H) 10/13/2017 0944   GLUCOSE 94 10/13/2017 0944   BUN 13.3 10/13/2017 0944   CREATININE 0.8 10/13/2017 0944   CALCIUM 9.7 10/13/2017 0944   PROT  7.3 10/13/2017 0944   ALBUMIN 3.6 10/13/2017 0944   AST 21 10/13/2017 0944   ALT 13 10/13/2017 0944   ALKPHOS 188 (H) 10/13/2017 0944   BILITOT <0.22 10/13/2017 0944   GFRNONAA >60 09/04/2017 1145   GFRAA >60 09/04/2017 1145   RADIOGRAPHIC STUDIES:  No results found.  ASSESSMENT and THERAPY PLAN:   Small cell lung cancer, left [facility]) This is a pleasant 71 year old white male diagnosed with extensive stage small cell lung cancer in September 2018. The patient presented with progressive shortness of breath and weight loss. He has widespread malignancy including his lymph nodes and bones.  Recommend that he proceed with cycle 2 of systemic chemotherapy consisting of carboplatin for an AUC of 5 with etoposide 100 mg/m  today as scheduled.   Recommend the patient continue with weekly labs. Continue antiemetics as needed.The patient will have a restaging CT scan of the chest, abdomen, and pelvis prior to his next visit. The patient will return in 3 weeks for evaluation prior to cycle 3 of his chemotherapy and to review his restaging CT scan results.   Anemia due to antineoplastic chemotherapy The patient's hemoglobin today is 9.9. Anemia is related to  his chemotherapy. We will follow this closely and transfuse if his hemoglobin drops to 8.0 or less.  Need for prophylactic vaccination and inoculation against influenza Influenza vaccine will be administered in infusion today per the patient request.  Protein calorie malnutrition ([place]) The patient's weight is stable. He will continue Marinol 2.5 mg twice a day.   Orders Placed This Encounter  Procedures  . CT ABDOMEN PELVIS W CONTRAST    Standing Status:   Future    Standing Expiration Date:   10/13/2018    Order Specific Question:   If indicated for the ordered procedure, I authorize the administration of contrast media per Radiology protocol    Answer:   Yes    Order Specific Question:   Preferred imaging location?    Answer:   [facility]    Order Specific Question:   Radiology Contrast Protocol - do NOT remove file path    Answer:   \\[email or web address]    Order Specific Question:   Reason for Exam additional comments    Answer:   Extensive stage small cell lung cancer. Restaging.  . CT CHEST W CONTRAST    Standing Status:   Future    Standing Expiration Date:   10/13/2018    Order Specific Question:   If indicated for the ordered procedure, I authorize the administration of contrast media per Radiology protocol    Answer:   Yes    Order Specific Question:   Preferred imaging location?    Answer:   [facility]    Order Specific Question:   Radiology Contrast Protocol - do NOT remove file path    Answer:   \\[email or web address]    Order Specific Question:   Reason for Exam additional comments    Answer:   Extensive stage small cell lung cancer. Restaging.    All questions were answered. The patient knows to call the clinic with any problems, questions or concerns. We can certainly see the patient much sooner if necessary.  Mikey Bussing, NP 10/13/2017

## 2017-10-13 NOTE — Assessment & Plan Note (Signed)
Influenza vaccine will be administered in infusion today per the patient request.

## 2017-10-14 ENCOUNTER — Encounter (HOSPITAL_COMMUNITY): Payer: Self-pay

## 2017-10-14 ENCOUNTER — Ambulatory Visit (HOSPITAL_BASED_OUTPATIENT_CLINIC_OR_DEPARTMENT_OTHER): Payer: Medicare Other

## 2017-10-14 VITALS — BP 142/87 | HR 92 | Temp 98.0°F | Resp 18

## 2017-10-14 DIAGNOSIS — C3492 Malignant neoplasm of unspecified part of left bronchus or lung: Secondary | ICD-10-CM

## 2017-10-14 DIAGNOSIS — Z5111 Encounter for antineoplastic chemotherapy: Secondary | ICD-10-CM | POA: Diagnosis present

## 2017-10-14 MED ORDER — DEXAMETHASONE SODIUM PHOSPHATE 10 MG/ML IJ SOLN
INTRAMUSCULAR | Status: AC
  Filled 2017-10-14: qty 1

## 2017-10-14 MED ORDER — DEXAMETHASONE SODIUM PHOSPHATE 10 MG/ML IJ SOLN
10.0000 mg | Freq: Once | INTRAMUSCULAR | Status: AC
  Administered 2017-10-14: 10 mg via INTRAVENOUS

## 2017-10-14 MED ORDER — SODIUM CHLORIDE 0.9 % IV SOLN
Freq: Once | INTRAVENOUS | Status: AC
  Administered 2017-10-14: 12:00:00 via INTRAVENOUS

## 2017-10-14 MED ORDER — SODIUM CHLORIDE 0.9 % IV SOLN
100.0000 mg/m2 | Freq: Once | INTRAVENOUS | Status: AC
  Administered 2017-10-14: 160 mg via INTRAVENOUS
  Filled 2017-10-14: qty 8

## 2017-10-15 ENCOUNTER — Ambulatory Visit (HOSPITAL_BASED_OUTPATIENT_CLINIC_OR_DEPARTMENT_OTHER): Payer: Medicare Other

## 2017-10-15 VITALS — BP 97/69 | HR 96 | Temp 98.6°F | Resp 18 | Wt 117.4 lb

## 2017-10-15 DIAGNOSIS — C3492 Malignant neoplasm of unspecified part of left bronchus or lung: Secondary | ICD-10-CM | POA: Diagnosis not present

## 2017-10-15 DIAGNOSIS — Z5111 Encounter for antineoplastic chemotherapy: Secondary | ICD-10-CM

## 2017-10-15 DIAGNOSIS — Z5189 Encounter for other specified aftercare: Secondary | ICD-10-CM

## 2017-10-15 MED ORDER — SODIUM CHLORIDE 0.9 % IV SOLN
Freq: Once | INTRAVENOUS | Status: AC
  Administered 2017-10-15: 11:00:00 via INTRAVENOUS

## 2017-10-15 MED ORDER — SODIUM CHLORIDE 0.9 % IV SOLN
100.0000 mg/m2 | Freq: Once | INTRAVENOUS | Status: AC
  Administered 2017-10-15: 160 mg via INTRAVENOUS
  Filled 2017-10-15: qty 8

## 2017-10-15 MED ORDER — DEXAMETHASONE SODIUM PHOSPHATE 10 MG/ML IJ SOLN
INTRAMUSCULAR | Status: AC
  Filled 2017-10-15: qty 1

## 2017-10-15 MED ORDER — DEXAMETHASONE SODIUM PHOSPHATE 10 MG/ML IJ SOLN
10.0000 mg | Freq: Once | INTRAMUSCULAR | Status: AC
  Administered 2017-10-15: 10 mg via INTRAVENOUS

## 2017-10-15 MED ORDER — PEGFILGRASTIM 6 MG/0.6ML ~~LOC~~ PSKT
6.0000 mg | PREFILLED_SYRINGE | Freq: Once | SUBCUTANEOUS | Status: AC
  Administered 2017-10-15: 6 mg via SUBCUTANEOUS
  Filled 2017-10-15: qty 0.6

## 2017-10-15 NOTE — Patient Instructions (Addendum)
[facility] Discharge Instructions for Patients Receiving Chemotherapy  Today you received the following chemotherapy agents Etoposide  To help prevent nausea and vomiting after your treatment, we encourage you to take your nausea medication as directed   If you develop nausea and vomiting that is not controlled by your nausea medication, call the clinic.   BELOW ARE SYMPTOMS THAT SHOULD BE REPORTED IMMEDIATELY:  *FEVER GREATER THAN 100.5 F  *CHILLS WITH OR WITHOUT FEVER  NAUSEA AND VOMITING THAT IS NOT CONTROLLED WITH YOUR NAUSEA MEDICATION  *UNUSUAL SHORTNESS OF BREATH  *UNUSUAL BRUISING OR BLEEDING  TENDERNESS IN MOUTH AND THROAT WITH OR WITHOUT PRESENCE OF ULCERS  *URINARY PROBLEMS  *BOWEL PROBLEMS  UNUSUAL RASH Items with * indicate a potential emergency and should be followed up as soon as possible.  Feel free to call the clinic should you have any questions or concerns. The clinic phone number is [phone].  Please show the [facility] [facility] at check-in to the Emergency Department and triage nurse.    Etoposide, VP-16 capsules What is this medicine? ETOPOSIDE, VP-16 (e toe POE side) is a chemotherapy drug. It is used to treat small cell lung cancer and other cancers. This medicine may be used for other purposes; ask your health care provider or pharmacist if you have questions. COMMON BRAND NAME(S): VePesid What should I tell my health care provider before I take this medicine? They need to know if you have any of these conditions: -infection -kidney disease -liver disease -low blood counts, like low white cell, platelet, or red cell counts -an unusual or allergic reaction to etoposide, other medicines, foods, dyes, or preservatives -pregnant or trying to get pregnant -breast-feeding How should I use this medicine? Take this medicine by mouth with a glass of water. Follow the directions on the prescription label. Do not open, crush, or chew  the capsules. It is advisable to wear gloves when handling this medicine. Take your medicine at regular intervals. Do not take it more often than directed. Do not stop taking except on your doctor's advice. Talk to your pediatrician regarding the use of this medicine in children. Special care may be needed. Overdosage: If you think you have taken too much of this medicine contact a poison control center or emergency room at once. NOTE: This medicine is only for you. Do not share this medicine with others. What if I miss a dose? If you miss a dose, take it as soon as you can. If it is almost time for your next dose, take only that dose. Do not take double or extra doses. What may interact with this medicine? -aspirin -certain medications for seizures like carbamazepine, phenobarbital, phenytoin, valproic acid -cyclosporine -levamisole -valproic acid -warfarin This list may not describe all possible interactions. Give your health care provider a list of all the medicines, herbs, non-prescription drugs, or dietary supplements you use. Also tell them if you smoke, drink alcohol, or use illegal drugs. Some items may interact with your medicine. What should I watch for while using this medicine? Visit your doctor for checks on your progress. This drug may make you feel generally unwell. This is not uncommon, as chemotherapy can affect healthy cells as well as cancer cells. Report any side effects. Continue your course of treatment even though you feel ill unless your doctor tells you to stop. In some cases, you may be given additional medicines to help with side effects. Follow all directions for their use. Call your doctor  or health care professional for advice if you get a fever, chills or sore throat, or other symptoms of a cold or flu. Do not treat yourself. This drug decreases your body's ability to fight infections. Try to avoid being around people who are sick. This medicine may increase your risk  to bruise or bleed. Call your doctor or health care professional if you notice any unusual bleeding. Talk to your doctor about your risk of cancer. You may be more at risk for certain types of cancers if you take this medicine. Do not become pregnant while taking this medicine or for at least 6 months after stopping it. Women should inform their doctor if they wish to become pregnant or think they might be pregnant. Women of child-bearing potential will need to have a negative pregnancy test before starting this medicine. There is a potential for serious side effects to an unborn child. Talk to your health care professional or pharmacist for more information. Do not breast-feed an infant while taking this medicine. Men must use a latex condom during sexual contact with a woman while taking this medicine and for at least 4 months after stopping it. A latex condom is needed even if you have had a vasectomy. Contact your doctor right away if your partner becomes pregnant. Do not donate sperm while taking this medicine and for 4 months after you stop taking this medicine. Men should inform their doctors if they wish to father a child. This medicine may lower sperm counts. What side effects may I notice from receiving this medicine? Side effects that you should report to your doctor or health care professional as soon as possible: -allergic reactions like skin rash, itching or hives, swelling of the face, lips, or tongue -low blood counts - this medicine may decrease the number of white blood cells, red blood cells and platelets. You may be at increased risk for infections and bleeding. -signs of infection - fever or chills, cough, sore throat, pain or difficulty passing urine -signs of decreased platelets or bleeding - bruising, pinpoint red spots on the skin, black, tarry stools, blood in the urine -signs of decreased red blood cells - unusually weak or tired, fainting spells, lightheadedness -breathing  problems -changes in vision -mouth or throat sores or ulcers -pain, tingling, numbness in the hands or feet -redness, blistering, peeling or loosening of the skin, including inside the mouth -seizures -vomiting Side effects that usually do not require medical attention (report to your doctor or health care professional if they continue or are bothersome): -change in taste -diarrhea -hair loss -nausea -stomach pain This list may not describe all possible side effects. Call your doctor for medical advice about side effects. You may report side effects to FDA at 1-800-FDA-1088. Where should I keep my medicine? Keep out of the reach of children. Store in a refrigerator between 2 and 8 degrees C (36 and 46 degrees F). Do not freeze. Throw away any unused medicine after the expiration date. NOTE: This sheet is a summary. It may not cover all possible information. If you have questions about this medicine, talk to your doctor, pharmacist, or health care provider.  2018 Elsevier/Gold Standard (2015-12-01 11:49:52)

## 2017-10-16 ENCOUNTER — Encounter (HOSPITAL_COMMUNITY): Payer: Self-pay

## 2017-10-20 ENCOUNTER — Other Ambulatory Visit (HOSPITAL_BASED_OUTPATIENT_CLINIC_OR_DEPARTMENT_OTHER): Payer: Medicare Other

## 2017-10-20 DIAGNOSIS — C3492 Malignant neoplasm of unspecified part of left bronchus or lung: Secondary | ICD-10-CM | POA: Diagnosis not present

## 2017-10-20 DIAGNOSIS — C3490 Malignant neoplasm of unspecified part of unspecified bronchus or lung: Secondary | ICD-10-CM

## 2017-10-20 LAB — CBC WITH DIFFERENTIAL/PLATELET
BASO%: 0.3 % (ref 0.0–2.0)
Basophils Absolute: 0.1 10*3/uL (ref 0.0–0.1)
EOS%: 0.1 % (ref 0.0–7.0)
Eosinophils Absolute: 0 10*3/uL (ref 0.0–0.5)
HEMATOCRIT: 29.6 % — AB (ref 38.4–49.9)
HEMOGLOBIN: 9.3 g/dL — AB (ref 13.0–17.1)
LYMPH#: 1.8 10*3/uL (ref 0.9–3.3)
LYMPH%: 7 % — ABNORMAL LOW (ref 14.0–49.0)
MCH: 30.5 pg (ref 27.2–33.4)
MCHC: 31.4 g/dL — ABNORMAL LOW (ref 32.0–36.0)
MCV: 97 fL (ref 79.3–98.0)
MONO#: 0.6 10*3/uL (ref 0.1–0.9)
MONO%: 2.3 % (ref 0.0–14.0)
NEUT#: 23.1 10*3/uL — ABNORMAL HIGH (ref 1.5–6.5)
NEUT%: 90.3 % — AB (ref 39.0–75.0)
PLATELETS: 581 10*3/uL — AB (ref 140–400)
RBC: 3.05 10*6/uL — ABNORMAL LOW (ref 4.20–5.82)
RDW: 14.8 % — AB (ref 11.0–14.6)
WBC: 25.6 10*3/uL — ABNORMAL HIGH (ref 4.0–10.3)

## 2017-10-20 LAB — COMPREHENSIVE METABOLIC PANEL
ALBUMIN: 3.8 g/dL (ref 3.5–5.0)
ALK PHOS: 212 U/L — AB (ref 40–150)
ALT: 27 U/L (ref 0–55)
AST: 29 U/L (ref 5–34)
Anion Gap: 9 mEq/L (ref 3–11)
BILIRUBIN TOTAL: 0.65 mg/dL (ref 0.20–1.20)
BUN: 15.9 mg/dL (ref 7.0–26.0)
CALCIUM: 10.1 mg/dL (ref 8.4–10.4)
CO2: 34 mEq/L — ABNORMAL HIGH (ref 22–29)
CREATININE: 0.7 mg/dL (ref 0.7–1.3)
Chloride: 95 mEq/L — ABNORMAL LOW (ref 98–109)
EGFR: >60 mL/min/{1.73_m2} (ref >60)
Glucose: 86 mg/dl (ref 70–140)
Potassium: 4 mEq/L (ref 3.5–5.1)
Sodium: 138 mEq/L (ref 136–145)
TOTAL PROTEIN: 7.3 g/dL (ref 6.4–8.3)

## 2017-10-21 ENCOUNTER — Encounter (HOSPITAL_COMMUNITY): Payer: Self-pay

## 2017-10-23 ENCOUNTER — Encounter (HOSPITAL_COMMUNITY): Payer: Self-pay

## 2017-10-25 ENCOUNTER — Telehealth: Payer: Self-pay

## 2017-10-25 NOTE — Telephone Encounter (Signed)
Called and left a message with new appt time for chemo on 11/13 to allow for longer 5hr pt tx  Jose Mckinney

## 2017-10-27 ENCOUNTER — Other Ambulatory Visit (HOSPITAL_BASED_OUTPATIENT_CLINIC_OR_DEPARTMENT_OTHER): Payer: Medicare Other

## 2017-10-27 DIAGNOSIS — C3492 Malignant neoplasm of unspecified part of left bronchus or lung: Secondary | ICD-10-CM | POA: Diagnosis not present

## 2017-10-27 DIAGNOSIS — C3490 Malignant neoplasm of unspecified part of unspecified bronchus or lung: Secondary | ICD-10-CM

## 2017-10-27 LAB — COMPREHENSIVE METABOLIC PANEL
ALT: 27 U/L (ref 0–55)
AST: 26 U/L (ref 5–34)
Albumin: 3.7 g/dL (ref 3.5–5.0)
Alkaline Phosphatase: 167 U/L — ABNORMAL HIGH (ref 40–150)
Anion Gap: 7 mEq/L (ref 3–11)
BUN: 14.1 mg/dL (ref 7.0–26.0)
CALCIUM: 10 mg/dL (ref 8.4–10.4)
CHLORIDE: 97 meq/L — AB (ref 98–109)
CO2: 35 meq/L — AB (ref 22–29)
CREATININE: 0.8 mg/dL (ref 0.7–1.3)
EGFR: >60 mL/min/{1.73_m2} (ref >60)
Glucose: 98 mg/dl (ref 70–140)
Potassium: 4 mEq/L (ref 3.5–5.1)
Sodium: 139 mEq/L (ref 136–145)
TOTAL PROTEIN: 7.2 g/dL (ref 6.4–8.3)
Total Bilirubin: <0.22 mg/dL (ref 0.20–1.20)

## 2017-10-27 LAB — CBC WITH DIFFERENTIAL/PLATELET
BASO%: 0.4 % (ref 0.0–2.0)
BASOS ABS: 0 10*3/uL (ref 0.0–0.1)
EOS ABS: 0.1 10*3/uL (ref 0.0–0.5)
EOS%: 0.6 % (ref 0.0–7.0)
HCT: 25.4 % — ABNORMAL LOW (ref 38.4–49.9)
HGB: 8.3 g/dL — ABNORMAL LOW (ref 13.0–17.1)
LYMPH%: 15.4 % (ref 14.0–49.0)
MCH: 30.5 pg (ref 27.2–33.4)
MCHC: 32.6 g/dL (ref 32.0–36.0)
MCV: 93.7 fL (ref 79.3–98.0)
MONO#: 0.7 10*3/uL (ref 0.1–0.9)
MONO%: 5.4 % (ref 0.0–14.0)
NEUT#: 9.6 10*3/uL — ABNORMAL HIGH (ref 1.5–6.5)
NEUT%: 78.2 % — ABNORMAL HIGH (ref 39.0–75.0)
Platelets: 105 10*3/uL — ABNORMAL LOW (ref 140–400)
RBC: 2.71 10*6/uL — AB (ref 4.20–5.82)
RDW: 14.4 % (ref 11.0–14.6)
WBC: 12.3 10*3/uL — AB (ref 4.0–10.3)
lymph#: 1.9 10*3/uL (ref 0.9–3.3)

## 2017-10-28 ENCOUNTER — Encounter (HOSPITAL_COMMUNITY): Payer: Self-pay

## 2017-10-30 ENCOUNTER — Encounter (HOSPITAL_COMMUNITY): Payer: Self-pay

## 2017-10-31 ENCOUNTER — Ambulatory Visit (HOSPITAL_COMMUNITY)
Admission: RE | Admit: 2017-10-31 | Discharge: 2017-10-31 | Disposition: A | Discharge status: Home / Self Care | Payer: Medicare Other | Source: Ambulatory Visit | Attending: Oncology | Admitting: Oncology

## 2017-10-31 DIAGNOSIS — C3492 Malignant neoplasm of unspecified part of left bronchus or lung: Secondary | ICD-10-CM

## 2017-10-31 DIAGNOSIS — I700 Atherosclerosis of aorta: Secondary | ICD-10-CM | POA: Diagnosis not present

## 2017-10-31 DIAGNOSIS — C7951 Secondary malignant neoplasm of bone: Secondary | ICD-10-CM | POA: Insufficient documentation

## 2017-10-31 DIAGNOSIS — C772 Secondary and unspecified malignant neoplasm of intra-abdominal lymph nodes: Secondary | ICD-10-CM | POA: Insufficient documentation

## 2017-10-31 DIAGNOSIS — K449 Diaphragmatic hernia without obstruction or gangrene: Secondary | ICD-10-CM | POA: Insufficient documentation

## 2017-10-31 MED ORDER — IOPAMIDOL (ISOVUE-300) INJECTION 61%
100.0000 mL | Freq: Once | INTRAVENOUS | Status: AC | PRN
  Administered 2017-10-31: 100 mL via INTRAVENOUS

## 2017-10-31 MED ORDER — IOPAMIDOL (ISOVUE-300) INJECTION 61%
INTRAVENOUS | Status: AC
  Filled 2017-10-31: qty 100

## 2017-11-03 ENCOUNTER — Encounter: Payer: Self-pay | Admitting: Internal Medicine

## 2017-11-03 ENCOUNTER — Ambulatory Visit (HOSPITAL_BASED_OUTPATIENT_CLINIC_OR_DEPARTMENT_OTHER): Payer: Medicare Other | Admitting: Internal Medicine

## 2017-11-03 ENCOUNTER — Ambulatory Visit (HOSPITAL_BASED_OUTPATIENT_CLINIC_OR_DEPARTMENT_OTHER): Payer: Medicare Other

## 2017-11-03 ENCOUNTER — Other Ambulatory Visit (HOSPITAL_BASED_OUTPATIENT_CLINIC_OR_DEPARTMENT_OTHER): Payer: Medicare Other

## 2017-11-03 VITALS — BP 118/69 | HR 81 | Temp 97.5°F | Resp 18 | Ht 69.0 in | Wt 113.9 lb

## 2017-11-03 DIAGNOSIS — C3492 Malignant neoplasm of unspecified part of left bronchus or lung: Secondary | ICD-10-CM

## 2017-11-03 DIAGNOSIS — Z5111 Encounter for antineoplastic chemotherapy: Secondary | ICD-10-CM

## 2017-11-03 DIAGNOSIS — C778 Secondary and unspecified malignant neoplasm of lymph nodes of multiple regions: Secondary | ICD-10-CM

## 2017-11-03 DIAGNOSIS — D6481 Anemia due to antineoplastic chemotherapy: Secondary | ICD-10-CM | POA: Diagnosis not present

## 2017-11-03 DIAGNOSIS — C3490 Malignant neoplasm of unspecified part of unspecified bronchus or lung: Secondary | ICD-10-CM

## 2017-11-03 DIAGNOSIS — C7951 Secondary malignant neoplasm of bone: Secondary | ICD-10-CM

## 2017-11-03 DIAGNOSIS — I10 Essential (primary) hypertension: Secondary | ICD-10-CM

## 2017-11-03 LAB — CBC WITH DIFFERENTIAL/PLATELET
BASO%: 0.4 % (ref 0.0–2.0)
Basophils Absolute: 0.1 10*3/uL (ref 0.0–0.1)
EOS ABS: 0.2 10*3/uL (ref 0.0–0.5)
EOS%: 0.9 % (ref 0.0–7.0)
HEMATOCRIT: 26.2 % — AB (ref 38.4–49.9)
HEMOGLOBIN: 8.2 g/dL — AB (ref 13.0–17.1)
LYMPH#: 4 10*3/uL — AB (ref 0.9–3.3)
LYMPH%: 16.2 % (ref 14.0–49.0)
MCH: 30.8 pg (ref 27.2–33.4)
MCHC: 31.3 g/dL — ABNORMAL LOW (ref 32.0–36.0)
MCV: 98.5 fL — AB (ref 79.3–98.0)
MONO#: 1.3 10*3/uL — AB (ref 0.1–0.9)
MONO%: 5.4 % (ref 0.0–14.0)
NEUT%: 77.1 % — ABNORMAL HIGH (ref 39.0–75.0)
NEUTROS ABS: 19 10*3/uL — AB (ref 1.5–6.5)
PLATELETS: 246 10*3/uL (ref 140–400)
RBC: 2.66 10*6/uL — ABNORMAL LOW (ref 4.20–5.82)
RDW: 17.1 % — ABNORMAL HIGH (ref 11.0–14.6)
WBC: 24.6 10*3/uL — AB (ref 4.0–10.3)

## 2017-11-03 LAB — COMPREHENSIVE METABOLIC PANEL
ALBUMIN: 3.6 g/dL (ref 3.5–5.0)
ALT: 21 U/L (ref 0–55)
AST: 25 U/L (ref 5–34)
Alkaline Phosphatase: 128 U/L (ref 40–150)
Anion Gap: 9 mEq/L (ref 3–11)
BUN: 10.6 mg/dL (ref 7.0–26.0)
CHLORIDE: 98 meq/L (ref 98–109)
CO2: 32 meq/L — AB (ref 22–29)
CREATININE: 0.8 mg/dL (ref 0.7–1.3)
Calcium: 9.8 mg/dL (ref 8.4–10.4)
EGFR: >60 mL/min/{1.73_m2} (ref >60)
GLUCOSE: 78 mg/dL (ref 70–140)
POTASSIUM: 4.4 meq/L (ref 3.5–5.1)
Sodium: 138 mEq/L (ref 136–145)
TOTAL PROTEIN: 7.1 g/dL (ref 6.4–8.3)
Total Bilirubin: 0.25 mg/dL (ref 0.20–1.20)

## 2017-11-03 MED ORDER — SODIUM CHLORIDE 0.9 % IV SOLN
Freq: Once | INTRAVENOUS | Status: AC
  Administered 2017-11-03: 12:00:00 via INTRAVENOUS

## 2017-11-03 MED ORDER — PALONOSETRON HCL INJECTION 0.25 MG/5ML
INTRAVENOUS | Status: AC
  Filled 2017-11-03: qty 5

## 2017-11-03 MED ORDER — HEPARIN SOD (PORK) LOCK FLUSH 100 UNIT/ML IV SOLN
500.0000 [IU] | Freq: Once | INTRAVENOUS | Status: DC | PRN
  Filled 2017-11-03: qty 5

## 2017-11-03 MED ORDER — SODIUM CHLORIDE 0.9 % IV SOLN
380.0000 mg | Freq: Once | INTRAVENOUS | Status: AC
  Administered 2017-11-03: 380 mg via INTRAVENOUS
  Filled 2017-11-03: qty 38

## 2017-11-03 MED ORDER — SODIUM CHLORIDE 0.9 % IV SOLN
100.0000 mg/m2 | Freq: Once | INTRAVENOUS | Status: AC
  Administered 2017-11-03: 160 mg via INTRAVENOUS
  Filled 2017-11-03: qty 8

## 2017-11-03 MED ORDER — SODIUM CHLORIDE 0.9% FLUSH
10.0000 mL | INTRAVENOUS | Status: DC | PRN
  Filled 2017-11-03: qty 10

## 2017-11-03 MED ORDER — PALONOSETRON HCL INJECTION 0.25 MG/5ML
0.2500 mg | Freq: Once | INTRAVENOUS | Status: AC
  Administered 2017-11-03: 0.25 mg via INTRAVENOUS

## 2017-11-03 MED ORDER — DEXAMETHASONE SODIUM PHOSPHATE 10 MG/ML IJ SOLN
INTRAMUSCULAR | Status: AC
  Filled 2017-11-03: qty 1

## 2017-11-03 MED ORDER — DEXAMETHASONE SODIUM PHOSPHATE 10 MG/ML IJ SOLN
10.0000 mg | Freq: Once | INTRAMUSCULAR | Status: AC
  Administered 2017-11-03: 10 mg via INTRAVENOUS

## 2017-11-03 NOTE — Patient Instructions (Signed)
[facility] Discharge Instructions for Patients Receiving Chemotherapy  Today you received the following chemotherapy agents: Carboplatin and Etoposide.  To help prevent nausea and vomiting after your treatment, we encourage you to take your nausea medication as directed by your doctor.   If you develop nausea and vomiting that is not controlled by your nausea medication, call the clinic.   BELOW ARE SYMPTOMS THAT SHOULD BE REPORTED IMMEDIATELY:  *FEVER GREATER THAN 100.5 F  *CHILLS WITH OR WITHOUT FEVER  NAUSEA AND VOMITING THAT IS NOT CONTROLLED WITH YOUR NAUSEA MEDICATION  *UNUSUAL SHORTNESS OF BREATH  *UNUSUAL BRUISING OR BLEEDING  TENDERNESS IN MOUTH AND THROAT WITH OR WITHOUT PRESENCE OF ULCERS  *URINARY PROBLEMS  *BOWEL PROBLEMS  UNUSUAL RASH Items with * indicate a potential emergency and should be followed up as soon as possible.  Feel free to call the clinic should you have any questions or concerns. The clinic phone number is [phone].  Please show the [facility] at check-in to the Emergency Department and triage nurse.

## 2017-11-03 NOTE — Progress Notes (Signed)
[facility] [phone]   [phone]  OFFICE PROGRESS NOTE  [name], PA-C [address] [place]  [address]  DIAGNOSIS: Extensive stage (T3, N3, M1c) small cell lung cancer presented with widespread malignancy including nodal stations, bones, left pleural space and lungs diagnosed in September 2018.  PRIOR THERAPY: None  CURRENT THERAPY: Systemic chemotherapy with carboplatin for AUC 5 on day 1 and etoposide 100 mg/M2 on days 1, 2 and 3 with Neulasta support.  Status post 2 cycles.  INTERVAL HISTORY: Jose Mckinney 71 y.o. male returns to the clinic today for follow-up visit accompanied by his wife and son.  The patient is feeling fine today with no specific complaints except for fatigue as well as shortness of breath at baseline increased with exertion.  He continues to tolerate his treatment with systemic chemotherapy with carboplatin and etoposide fairly well.  He denied having any current chest pain, cough or hemoptysis.  He denied having any fever or chills.  He has no nausea, vomiting, diarrhea or constipation.  He had repeat CT scan of the chest, abdomen and pelvis performed recently and he is here for evaluation and discussion of his discuss results.  MEDICAL HISTORY: Past Medical History:  Diagnosis Date  . Anxiety   . CHF (congestive heart failure) (HCC)    x 3 episodes- [place]. pt. living here since 11-'1-16  . 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 Depression   . Dyspnea   . HTN (hypertension)   . On home oxygen therapy   . Pulmonary HTN (HCC)    Dr. Marlyn Corporal is following.  . Recurrent left pleural effusion   . Sciatic leg pain    INTERMITTENT- occ. uses Hydrocodone as needed  . Seasonal allergies   . Small cell lung cancer, left ([facility]) 09/15/2017    ALLERGIES:  has No Known Allergies.  MEDICATIONS:  Current Outpatient Medications  Medication Sig Dispense Refill  . albuterol (PROVENTIL) (2.5 MG/3ML)  0.083% nebulizer solution Take 3 mLs (2.5 mg total) by nebulization every 6 (six) hours as needed for wheezing or shortness of breath. 75 mL 12  . dronabinol (MARINOL) 2.5 MG capsule Take 1 capsule (2.5 mg total) by mouth 2 (two) times daily before a meal. 60 capsule 0  . feeding supplement (BOOST HIGH PROTEIN) LIQD Take 1 Container by mouth daily.     . fluticasone (FLONASE) 50 MCG/ACT nasal spray Place 2 sprays into both nostrils daily as needed for allergies or rhinitis.    Marland Kitchen ibuprofen (ADVIL,MOTRIN) 200 MG tablet Take 600 mg by mouth 3 (three) times daily as needed.    Marland Kitchen ipratropium (ATROVENT) 0.02 % nebulizer solution Take 2.5 mLs (0.5 mg total) by nebulization 4 (four) times daily. 75 mL 12  . LORazepam (ATIVAN) 0.5 MG tablet Take 1 tablet (0.5 mg total) by mouth every 8 (eight) hours. Take 1 tab 1 hour prior to MRI. May repeat X1 at time of MRI. 2 tablet 0  . prochlorperazine (COMPAZINE) 10 MG tablet Take 1 tablet (10 mg total) by mouth every 6 (six) hours as needed for nausea or vomiting. 30 tablet 1  . sertraline (ZOLOFT) 50 MG tablet Take 50 mg by mouth 2 (two) times daily.    . sildenafil (REVATIO) 20 MG tablet Take 1 tablet (20 mg total) by mouth 3 (three) times daily. 90 tablet 6  . tiotropium (SPIRIVA HANDIHALER) 18 MCG inhalation capsule Place 1 capsule (18 mcg total) into  inhaler and inhale daily. 30 capsule 12  . torsemide (DEMADEX) 20 MG tablet Take 0.5 tablets (10 mg total) by mouth as needed (SWELLING, SOB).     No current facility-administered medications for this visit.     SURGICAL HISTORY:  Past Surgical History:  Procedure Laterality Date  . ANGIOPLASTY    . CARDIAC CATHETERIZATION     '15- [place]- no issues now.  . IR THORACENTESIS ASP PLEURAL SPACE W/IMG GUIDE  08/08/2017  . MASTOIDECTOMY Left   . TONSILLECTOMY      REVIEW OF SYSTEMS:  Constitutional: positive for fatigue Eyes: negative Ears, nose, mouth, throat, and face: negative Respiratory:  positive for dyspnea on exertion Cardiovascular: negative Gastrointestinal: negative Genitourinary:negative Integument/breast: negative Hematologic/lymphatic: negative Musculoskeletal:positive for muscle weakness Neurological: negative Behavioral/Psych: negative Endocrine: negative Allergic/Immunologic: negative   PHYSICAL EXAMINATION: General appearance: alert, cooperative, fatigued and no distress Head: Normocephalic, without obvious abnormality, atraumatic Neck: no adenopathy, no JVD, supple, symmetrical, trachea midline and thyroid not enlarged, symmetric, no tenderness/mass/nodules Lymph nodes: Cervical, supraclavicular, and axillary nodes normal. Resp: wheezes bilaterally Back: symmetric, no curvature. ROM normal. No CVA tenderness. Cardio: regular rate and rhythm, S1, S2 normal, no murmur, click, rub or gallop GI: soft, non-tender; bowel sounds normal; no masses,  no organomegaly Extremities: extremities normal, atraumatic, no cyanosis or edema Neurologic: Alert and oriented X 3, normal strength and tone. Normal symmetric reflexes. Normal coordination and gait  ECOG PERFORMANCE STATUS: 1 - Symptomatic but completely ambulatory  Blood pressure 118/69, pulse 81, temperature (!) 97.5 F (36.4 C), temperature source Oral, resp. rate 18, height 5\' 9"  (1.753 m), weight 113 lb 14.4 oz (51.7 kg), SpO2 93 %.  LABORATORY DATA: Lab Results  Component Value Date   WBC 24.6 (H) 11/03/2017   HGB 8.2 (L) 11/03/2017   HCT 26.2 (L) 11/03/2017   MCV 98.5 (H) 11/03/2017   PLT 246 11/03/2017      Chemistry      Component Value Date/Time   NA 138 11/03/2017 1044   K 4.4 11/03/2017 1044   CL 95 (L) 09/04/2017 1145   CO2 32 (H) 11/03/2017 1044   BUN 10.6 11/03/2017 1044   CREATININE 0.8 11/03/2017 1044      Component Value Date/Time   CALCIUM 9.8 11/03/2017 1044   ALKPHOS 128 11/03/2017 1044   AST 25 11/03/2017 1044   ALT 21 11/03/2017 1044   BILITOT 0.25 11/03/2017 1044        RADIOGRAPHIC STUDIES: Ct Chest W Contrast  Result Date: 10/31/2017 CLINICAL DATA:  Followup small cell lung cancer EXAM: CT CHEST, ABDOMEN, AND PELVIS WITH CONTRAST TECHNIQUE: Multidetector CT imaging of the chest, abdomen and pelvis was performed following the standard protocol during bolus administration of intravenous contrast. CONTRAST:  125mL ISOVUE-300 IOPAMIDOL (ISOVUE-300) INJECTION 61% COMPARISON:  PET-CT from 08/27/2017. FINDINGS: CT CHEST FINDINGS Cardiovascular: The heart size is mildly enlarged. Aortic atherosclerosis noted. Calcification within the LAD and RCA coronary artery identified. No pericardial effusion. Mediastinum/Nodes: The index left supraclavicular node measures 0.9 cm, image 9 of series 2. Previously 1.4 cm. Previous index lymph node within the posterior mediastinum adjacent to the esophagus has resolved in the interval. Within the prevascular portion of the anterior mediastinum there is a 7 mm lymph node, image 23 of series 2. Previously 1.1 cm. 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 The appearance is unchanged from previous exam. Enhancing nodularity within the posterior right lower lobe measures 2.4 by 2.4 cm, image 37 of series  2. Previously this measured the same. Index right lower lobe lung nodule measures 0.8 cm, image 109 of series 6. Previously 0.9 cm. Musculoskeletal: 2.1 cm sclerotic lesion within the left humeral head corresponds to previous lytic metastasis, image 10 of series 6. Compatible with interval healing. Mixed lytic and sclerotic bone lesion within the posterior T10 vertebral body is identified, image 70 of series 5. Previously there was a pure lytic lesion. Within the mid body of the sternum there is a sclerotic lesion measuring 1.5 cm, image 76 of series 5. This corresponds to a previous lytic lesion. CT ABDOMEN PELVIS FINDINGS Hepatobiliary: Medial left lobe of liver cyst is unchanged  measuring 5 mm. No suspicious liver lesions identified. Gallbladder normal. No significant biliary dilatation. Pancreas: Fluid attenuating lesion within the head of pancreas measures 6 mm and is unchanged from previous exam. Not hypermetabolic on previous PET-CT. No pancreatic inflammation identified. Spleen: The spleen measures 10 cm in length. No focal splenic abnormality. Adrenals/Urinary Tract: Normal appearance of the right adrenal gland. Similar size of left left adrenal nodule measuring 1.9 cm. Previously hypermetabolic. No kidney mass or hydronephrosis. Nonobstructing left scratch set nonobstructing bilateral renal calculi noted. Urinary bladder is normal Stomach/Bowel: Small hiatal hernia. No pathologic dilatation of the small bowel loops. Distal colonic diverticulosis without acute inflammation. Vascular/Lymphatic: Aortic atherosclerosis. Index left periaortic node measures 9 mm, image 66 of series 2. Previously 1.4 cm. Left periaortic node Measures 9 mm, image 73 of series 2. Previously 1.3 cm. Reproductive: Prostate is unremarkable. Other: No abdominal wall hernia or abnormality. No abdominopelvic ascites. Musculoskeletal: There is areas of increased scleroses corresponding to previous lytic bone metastases compatible with interval healing. IMPRESSION: 1. Interval response to therapy. Thoracic and abdominal nodal metastases have decreased in size from previous exam. 2. Multifocal lytic bone metastases now appear more sclerotic compatible with interval healing. 3. No change in chronic bilateral partially calcified fibrothoraces 4.  Aortic Atherosclerosis (ICD10-I70.0). 5. Small hiatal hernia Electronically Signed   By: Kerby Moors M.D.   On: 10/31/2017 17:04   Ct Abdomen Pelvis W Contrast  Result Date: 10/31/2017 CLINICAL DATA:  Followup small cell lung cancer EXAM: CT CHEST, ABDOMEN, AND PELVIS WITH CONTRAST TECHNIQUE: Multidetector CT imaging of the chest, abdomen and pelvis was performed  following the standard protocol during bolus administration of intravenous contrast. CONTRAST:  162mL ISOVUE-300 IOPAMIDOL (ISOVUE-300) INJECTION 61% COMPARISON:  PET-CT from 08/27/2017. FINDINGS: CT CHEST FINDINGS Cardiovascular: The heart size is mildly enlarged. Aortic atherosclerosis noted. Calcification within the LAD and RCA coronary artery identified. No pericardial effusion. Mediastinum/Nodes: The index left supraclavicular node measures 0.9 cm, image 9 of series 2. Previously 1.4 cm. Previous index lymph node within the posterior mediastinum adjacent to the esophagus has resolved in the interval. Within the prevascular portion of the anterior mediastinum there is a 7 mm lymph node, image 23 of series 2. Previously 1.1 cm. 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 The appearance is unchanged from previous exam. Enhancing nodularity within the posterior right lower lobe measures 2.4 by 2.4 cm, image 37 of series 2. Previously this measured the same. Index right lower lobe lung nodule measures 0.8 cm, image 109 of series 6. Previously 0.9 cm. Musculoskeletal: 2.1 cm sclerotic lesion within the left humeral head corresponds to previous lytic metastasis, image 10 of series 6. Compatible with interval healing. Mixed lytic and sclerotic bone lesion within the posterior T10 vertebral body is identified, image 70 of series 5. Previously there was  a pure lytic lesion. Within the mid body of the sternum there is a sclerotic lesion measuring 1.5 cm, image 76 of series 5. This corresponds to a previous lytic lesion. CT ABDOMEN PELVIS FINDINGS Hepatobiliary: Medial left lobe of liver cyst is unchanged measuring 5 mm. No suspicious liver lesions identified. Gallbladder normal. No significant biliary dilatation. Pancreas: Fluid attenuating lesion within the head of pancreas measures 6 mm and is unchanged from previous exam. Not hypermetabolic on previous  PET-CT. No pancreatic inflammation identified. Spleen: The spleen measures 10 cm in length. No focal splenic abnormality. Adrenals/Urinary Tract: Normal appearance of the right adrenal gland. Similar size of left left adrenal nodule measuring 1.9 cm. Previously hypermetabolic. No kidney mass or hydronephrosis. Nonobstructing left scratch set nonobstructing bilateral renal calculi noted. Urinary bladder is normal Stomach/Bowel: Small hiatal hernia. No pathologic dilatation of the small bowel loops. Distal colonic diverticulosis without acute inflammation. Vascular/Lymphatic: Aortic atherosclerosis. Index left periaortic node measures 9 mm, image 66 of series 2. Previously 1.4 cm. Left periaortic node Measures 9 mm, image 73 of series 2. Previously 1.3 cm. Reproductive: Prostate is unremarkable. Other: No abdominal wall hernia or abnormality. No abdominopelvic ascites. Musculoskeletal: There is areas of increased scleroses corresponding to previous lytic bone metastases compatible with interval healing. IMPRESSION: 1. Interval response to therapy. Thoracic and abdominal nodal metastases have decreased in size from previous exam. 2. Multifocal lytic bone metastases now appear more sclerotic compatible with interval healing. 3. No change in chronic bilateral partially calcified fibrothoraces 4.  Aortic Atherosclerosis (ICD10-I70.0). 5. Small hiatal hernia Electronically Signed   By: Kerby Moors M.D.   On: 10/31/2017 17:04    ASSESSMENT AND PLAN: This is a very pleasant 71 years old white male recently diagnosed with extensive stage small cell lung cancer and currently undergoing systemic chemotherapy with carboplatin and etoposide status post 2 cycles. The patient has been tolerating this treatment fairly well with no significant adverse effects. He had repeat CT scan of the chest, abdomen and pelvis performed recently.  I personally and independently reviewed the scan images and discussed the results with the  patient and his family.  He has a scan showed improvement of his disease with decrease in the size of the thoracic and abdominal nodal metastasis as well as healing of the bone disease. I recommended for the patient to proceed with cycle #3 today as a schedule. I will see him back for follow-up visit in 3 weeks for evaluation before starting cycle #4. For the chemotherapy-induced anemia, I will arrange for the patient to receive 2 units of PRBCs transfusion this week. He was advised to call immediately if he has any concerning symptoms in the interval. The patient voices understanding of current disease status and treatment options and is in agreement with the current care plan.  All questions were answered. The patient knows to call the clinic with any problems, questions or concerns. We can certainly see the patient much sooner if necessary.  I spent 15 minutes counseling the patient face to face. The total time spent in the appointment was 25 minutes.  Disclaimer: This note was dictated with voice recognition software. Similar sounding words can inadvertently be transcribed and may not be corrected upon review.

## 2017-11-04 ENCOUNTER — Telehealth: Payer: Self-pay | Admitting: Internal Medicine

## 2017-11-04 ENCOUNTER — Ambulatory Visit (HOSPITAL_BASED_OUTPATIENT_CLINIC_OR_DEPARTMENT_OTHER): Payer: Medicare Other

## 2017-11-04 ENCOUNTER — Encounter (HOSPITAL_COMMUNITY): Payer: Self-pay

## 2017-11-04 ENCOUNTER — Other Ambulatory Visit: Payer: Self-pay | Admitting: Medical Oncology

## 2017-11-04 ENCOUNTER — Ambulatory Visit (HOSPITAL_COMMUNITY)
Admission: RE | Admit: 2017-11-04 | Discharge: 2017-11-04 | Disposition: A | Discharge status: Home / Self Care | Payer: Medicare Other | Source: Ambulatory Visit | Attending: Internal Medicine | Admitting: Internal Medicine

## 2017-11-04 VITALS — BP 123/75 | HR 91 | Temp 98.1°F | Resp 20

## 2017-11-04 DIAGNOSIS — C3490 Malignant neoplasm of unspecified part of unspecified bronchus or lung: Secondary | ICD-10-CM

## 2017-11-04 DIAGNOSIS — D6481 Anemia due to antineoplastic chemotherapy: Secondary | ICD-10-CM | POA: Insufficient documentation

## 2017-11-04 DIAGNOSIS — C3492 Malignant neoplasm of unspecified part of left bronchus or lung: Secondary | ICD-10-CM | POA: Diagnosis present

## 2017-11-04 DIAGNOSIS — T451X5A Adverse effect of antineoplastic and immunosuppressive drugs, initial encounter: Secondary | ICD-10-CM

## 2017-11-04 DIAGNOSIS — Z5111 Encounter for antineoplastic chemotherapy: Secondary | ICD-10-CM | POA: Diagnosis present

## 2017-11-04 LAB — CBC WITH DIFFERENTIAL/PLATELET
BASO%: 0.1 % (ref 0.0–2.0)
BASOS ABS: 0 10*3/uL (ref 0.0–0.1)
EOS ABS: 0 10*3/uL (ref 0.0–0.5)
EOS%: 0 % (ref 0.0–7.0)
HCT: 27.3 % — ABNORMAL LOW (ref 38.4–49.9)
HGB: 8.7 g/dL — ABNORMAL LOW (ref 13.0–17.1)
LYMPH%: 12.5 % — AB (ref 14.0–49.0)
MCH: 31.2 pg (ref 27.2–33.4)
MCHC: 31.9 g/dL — AB (ref 32.0–36.0)
MCV: 97.8 fL (ref 79.3–98.0)
MONO#: 1 10*3/uL — AB (ref 0.1–0.9)
MONO%: 3.7 % (ref 0.0–14.0)
NEUT%: 83.7 % — AB (ref 39.0–75.0)
NEUTROS ABS: 22.2 10*3/uL — AB (ref 1.5–6.5)
PLATELETS: 347 10*3/uL (ref 140–400)
RBC: 2.79 10*6/uL — AB (ref 4.20–5.82)
RDW: 17.4 % — ABNORMAL HIGH (ref 11.0–14.6)
WBC: 26.5 10*3/uL — AB (ref 4.0–10.3)
lymph#: 3.3 10*3/uL (ref 0.9–3.3)

## 2017-11-04 LAB — COMPREHENSIVE METABOLIC PANEL
ALT: 16 U/L (ref 0–55)
ANION GAP: 7 meq/L (ref 3–11)
AST: 20 U/L (ref 5–34)
Albumin: 3.6 g/dL (ref 3.5–5.0)
Alkaline Phosphatase: 116 U/L (ref 40–150)
BILIRUBIN TOTAL: 0.28 mg/dL (ref 0.20–1.20)
BUN: 11.1 mg/dL (ref 7.0–26.0)
CHLORIDE: 98 meq/L (ref 98–109)
CO2: 32 meq/L — AB (ref 22–29)
Calcium: 10 mg/dL (ref 8.4–10.4)
Creatinine: 0.8 mg/dL (ref 0.7–1.3)
GLUCOSE: 95 mg/dL (ref 70–140)
POTASSIUM: 4.1 meq/L (ref 3.5–5.1)
SODIUM: 137 meq/L (ref 136–145)
TOTAL PROTEIN: 7.1 g/dL (ref 6.4–8.3)

## 2017-11-04 LAB — ABO/RH: ABO/RH(D): A NEG

## 2017-11-04 LAB — PREPARE RBC (CROSSMATCH)

## 2017-11-04 MED ORDER — ACETAMINOPHEN 325 MG PO TABS
650.0000 mg | ORAL_TABLET | Freq: Once | ORAL | Status: AC
  Administered 2017-11-04: 650 mg via ORAL

## 2017-11-04 MED ORDER — SODIUM CHLORIDE 0.9 % IV SOLN: 250.0000 mL | Freq: Once | INTRAVENOUS | Status: DC

## 2017-11-04 MED ORDER — ACETAMINOPHEN 325 MG PO TABS
ORAL_TABLET | ORAL | Status: AC
Start: 2017-11-04 — End: ?
  Filled 2017-11-04: qty 2

## 2017-11-04 MED ORDER — DEXAMETHASONE SODIUM PHOSPHATE 10 MG/ML IJ SOLN
INTRAMUSCULAR | Status: AC
  Filled 2017-11-04: qty 1

## 2017-11-04 MED ORDER — SODIUM CHLORIDE 0.9 % IV SOLN
Freq: Once | INTRAVENOUS | Status: AC
  Administered 2017-11-04: 14:00:00 via INTRAVENOUS

## 2017-11-04 MED ORDER — DIPHENHYDRAMINE HCL 25 MG PO CAPS
ORAL_CAPSULE | ORAL | Status: AC
  Filled 2017-11-04: qty 1

## 2017-11-04 MED ORDER — DEXAMETHASONE SODIUM PHOSPHATE 10 MG/ML IJ SOLN
10.0000 mg | Freq: Once | INTRAMUSCULAR | Status: AC
  Administered 2017-11-04: 10 mg via INTRAVENOUS

## 2017-11-04 MED ORDER — SODIUM CHLORIDE 0.9 % IV SOLN
100.0000 mg/m2 | Freq: Once | INTRAVENOUS | Status: AC
  Administered 2017-11-04: 160 mg via INTRAVENOUS
  Filled 2017-11-04: qty 8

## 2017-11-04 MED ORDER — DIPHENHYDRAMINE HCL 25 MG PO CAPS
25.0000 mg | ORAL_CAPSULE | Freq: Once | ORAL | Status: AC
  Administered 2017-11-04: 25 mg via ORAL

## 2017-11-04 NOTE — Patient Instructions (Signed)

## 2017-11-04 NOTE — Telephone Encounter (Signed)
Called patient regarding current schedule, I will also mail a letter.

## 2017-11-05 ENCOUNTER — Ambulatory Visit (HOSPITAL_BASED_OUTPATIENT_CLINIC_OR_DEPARTMENT_OTHER): Payer: Medicare Other

## 2017-11-05 ENCOUNTER — Ambulatory Visit: Payer: Medicare Other

## 2017-11-05 ENCOUNTER — Telehealth: Payer: Self-pay | Admitting: Internal Medicine

## 2017-11-05 VITALS — BP 112/94 | HR 85 | Temp 97.7°F | Resp 19 | Wt 119.0 lb

## 2017-11-05 DIAGNOSIS — Z5111 Encounter for antineoplastic chemotherapy: Secondary | ICD-10-CM

## 2017-11-05 DIAGNOSIS — Z5189 Encounter for other specified aftercare: Secondary | ICD-10-CM

## 2017-11-05 DIAGNOSIS — C3492 Malignant neoplasm of unspecified part of left bronchus or lung: Secondary | ICD-10-CM

## 2017-11-05 DIAGNOSIS — D6481 Anemia due to antineoplastic chemotherapy: Secondary | ICD-10-CM

## 2017-11-05 DIAGNOSIS — T451X5A Adverse effect of antineoplastic and immunosuppressive drugs, initial encounter: Principal | ICD-10-CM

## 2017-11-05 LAB — PREPARE RBC (CROSSMATCH)

## 2017-11-05 MED ORDER — DIPHENHYDRAMINE HCL 25 MG PO CAPS
ORAL_CAPSULE | ORAL | Status: AC
  Filled 2017-11-05: qty 1

## 2017-11-05 MED ORDER — ACETAMINOPHEN 325 MG PO TABS
650.0000 mg | ORAL_TABLET | Freq: Once | ORAL | Status: AC
  Administered 2017-11-05: 650 mg via ORAL

## 2017-11-05 MED ORDER — DEXAMETHASONE SODIUM PHOSPHATE 10 MG/ML IJ SOLN
10.0000 mg | Freq: Once | INTRAMUSCULAR | Status: AC
  Administered 2017-11-05: 10 mg via INTRAVENOUS

## 2017-11-05 MED ORDER — DIPHENHYDRAMINE HCL 25 MG PO CAPS
25.0000 mg | ORAL_CAPSULE | Freq: Once | ORAL | Status: AC
Start: 2017-11-05 — End: 2017-11-05
  Administered 2017-11-05: 25 mg via ORAL

## 2017-11-05 MED ORDER — PEGFILGRASTIM 6 MG/0.6ML ~~LOC~~ PSKT
6.0000 mg | PREFILLED_SYRINGE | Freq: Once | SUBCUTANEOUS | Status: AC
  Administered 2017-11-05: 6 mg via SUBCUTANEOUS
  Filled 2017-11-05: qty 0.6

## 2017-11-05 MED ORDER — DEXAMETHASONE SODIUM PHOSPHATE 10 MG/ML IJ SOLN
INTRAMUSCULAR | Status: AC
  Filled 2017-11-05: qty 1

## 2017-11-05 MED ORDER — SODIUM CHLORIDE 0.9 % IV SOLN
Freq: Once | INTRAVENOUS | Status: AC
  Administered 2017-11-05: 11:00:00 via INTRAVENOUS

## 2017-11-05 MED ORDER — ACETAMINOPHEN 325 MG PO TABS
ORAL_TABLET | ORAL | Status: AC
Start: 2017-11-05 — End: ?
  Filled 2017-11-05: qty 2

## 2017-11-05 MED ORDER — SODIUM CHLORIDE 0.9 % IV SOLN
100.0000 mg/m2 | Freq: Once | INTRAVENOUS | Status: AC
  Administered 2017-11-05: 160 mg via INTRAVENOUS
  Filled 2017-11-05: qty 8

## 2017-11-05 MED ORDER — SODIUM CHLORIDE 0.9 % IV SOLN
250.0000 mL | Freq: Once | INTRAVENOUS | Status: AC
Start: 2017-11-05 — End: 2017-11-05
  Administered 2017-11-05: 250 mL via INTRAVENOUS

## 2017-11-05 NOTE — Telephone Encounter (Signed)
Spoke with patient's wife regarding adding blood appt per 11/13 sch msg. She was confused regarding coming in today - tried to schedule at both sickle cell and infusion today and they were both capped. Scheduled with sickle cell tomorrow - Diane is going to call patient's wife back regarding appts.

## 2017-11-05 NOTE — Patient Instructions (Signed)
[facility] Discharge Instructions for Patients Receiving Chemotherapy  Today you received the following chemotherapy agents:  Etoposide.  To help prevent nausea and vomiting after your treatment, we encourage you to take your nausea medication as directed by your doctor.   If you develop nausea and vomiting that is not controlled by your nausea medication, call the clinic.   BELOW ARE SYMPTOMS THAT SHOULD BE REPORTED IMMEDIATELY:  *FEVER GREATER THAN 100.5 F  *CHILLS WITH OR WITHOUT FEVER  NAUSEA AND VOMITING THAT IS NOT CONTROLLED WITH YOUR NAUSEA MEDICATION  *UNUSUAL SHORTNESS OF BREATH  *UNUSUAL BRUISING OR BLEEDING  TENDERNESS IN MOUTH AND THROAT WITH OR WITHOUT PRESENCE OF ULCERS  *URINARY PROBLEMS  *BOWEL PROBLEMS  UNUSUAL RASH Items with * indicate a potential emergency and should be followed up as soon as possible.  Feel free to call the clinic should you have any questions or concerns. The clinic phone number is [phone].  Please show the [facility] at check-in to the Emergency Department and triage nurse.   Blood Transfusion, Care After This sheet gives you information about how to care for yourself after your procedure. Your doctor may also give you more specific instructions. If you have problems or questions, contact your doctor. Follow these instructions at home:  Take over-the-counter and prescription medicines only as told by your doctor.  Go back to your normal activities as told by your doctor.  Follow instructions from your doctor about how to take care of the area where an IV tube was put into your vein (insertion site). Make sure you: ? Wash your hands with soap and water before you change your bandage (dressing). If there is no soap and water, use hand sanitizer. ? Change your bandage as told by your doctor.  Check your IV insertion site every day for signs of infection. Check for: ? More redness, swelling, or  pain. ? More fluid or blood. ? Warmth. ? Pus or a bad smell. Contact a doctor if:  You have more redness, swelling, or pain around the IV insertion site..  You have more fluid or blood coming from the IV insertion site.  Your IV insertion site feels warm to the touch.  You have pus or a bad smell coming from the IV insertion site.  Your pee (urine) turns pink, red, or brown.  You feel weak after doing your normal activities. Get help right away if:  You have signs of a serious allergic or body defense (immune) system reaction, including: ? Itchiness. ? Hives. ? Trouble breathing. ? Anxiety. ? Pain in your chest or lower back. ? Fever, flushing, and chills. ? Fast pulse. ? Rash. ? Watery poop (diarrhea). ? Throwing up (vomiting). ? Dark pee. ? Serious headache. ? Dizziness. ? Stiff neck. ? Yellow color in your face or the white parts of your eyes (jaundice). Summary  After a blood transfusion, return to your normal activities as told by your doctor.  Every day, check for signs of infection where the IV tube was put into your vein.  Some signs of infection are warm skin, more redness and pain, more fluid or blood, and pus or a bad smell where the needle went in.  Contact your doctor if you feel weak or have any unusual symptoms. This information is not intended to replace advice given to you by your health care provider. Make sure you discuss any questions you have with your health care provider. Document Released: 12/30/2014 Document  Revised: 08/02/2016 Document Reviewed: 08/02/2016 Elsevier Interactive Patient Education  2017 [employer].

## 2017-11-05 NOTE — Telephone Encounter (Signed)
Pt will get I unit of blood today after chemo.Blood appt cancelled for tomorrow.

## 2017-11-06 ENCOUNTER — Encounter (HOSPITAL_COMMUNITY): Payer: Medicare Other

## 2017-11-06 ENCOUNTER — Encounter (HOSPITAL_COMMUNITY): Payer: Self-pay

## 2017-11-06 LAB — TYPE AND SCREEN
ABO/RH(D): A NEG
ANTIBODY SCREEN: NEGATIVE
UNIT DIVISION: 0
UNIT DIVISION: 0

## 2017-11-06 LAB — BPAM RBC
BLOOD PRODUCT EXPIRATION DATE: 201811202359
Blood Product Expiration Date: 201811302359
ISSUE DATE / TIME: 201811131456
ISSUE DATE / TIME: 201811141159
UNIT TYPE AND RH: 600
Unit Type and Rh: 600

## 2017-11-10 ENCOUNTER — Other Ambulatory Visit: Payer: Medicare Other

## 2017-11-10 ENCOUNTER — Other Ambulatory Visit (HOSPITAL_BASED_OUTPATIENT_CLINIC_OR_DEPARTMENT_OTHER): Payer: Medicare Other

## 2017-11-10 DIAGNOSIS — C3492 Malignant neoplasm of unspecified part of left bronchus or lung: Secondary | ICD-10-CM

## 2017-11-10 DIAGNOSIS — C3490 Malignant neoplasm of unspecified part of unspecified bronchus or lung: Secondary | ICD-10-CM

## 2017-11-10 LAB — CBC WITH DIFFERENTIAL/PLATELET
BASO%: 0.1 % (ref 0.0–2.0)
Basophils Absolute: 0 10*3/uL (ref 0.0–0.1)
EOS ABS: 0.1 10*3/uL (ref 0.0–0.5)
EOS%: 0.4 % (ref 0.0–7.0)
HEMATOCRIT: 28.4 % — AB (ref 38.4–49.9)
HGB: 9.4 g/dL — ABNORMAL LOW (ref 13.0–17.1)
LYMPH#: 1.1 10*3/uL (ref 0.9–3.3)
LYMPH%: 4.7 % — AB (ref 14.0–49.0)
MCH: 31.2 pg (ref 27.2–33.4)
MCHC: 33.2 g/dL (ref 32.0–36.0)
MCV: 93.8 fL (ref 79.3–98.0)
MONO#: 0.1 10*3/uL (ref 0.1–0.9)
MONO%: 0.4 % (ref 0.0–14.0)
NEUT%: 94.4 % — AB (ref 39.0–75.0)
NEUTROS ABS: 22.9 10*3/uL — AB (ref 1.5–6.5)
PLATELETS: 293 10*3/uL (ref 140–400)
RBC: 3.02 10*6/uL — AB (ref 4.20–5.82)
RDW: 16.2 % — ABNORMAL HIGH (ref 11.0–14.6)
WBC: 24.3 10*3/uL — AB (ref 4.0–10.3)

## 2017-11-10 LAB — COMPREHENSIVE METABOLIC PANEL
ALT: 15 U/L (ref 0–55)
AST: 19 U/L (ref 5–34)
Albumin: 3.7 g/dL (ref 3.5–5.0)
Alkaline Phosphatase: 156 U/L — ABNORMAL HIGH (ref 40–150)
Anion Gap: 8 mEq/L (ref 3–11)
BUN: 22.2 mg/dL (ref 7.0–26.0)
CHLORIDE: 97 meq/L — AB (ref 98–109)
CO2: 33 mEq/L — ABNORMAL HIGH (ref 22–29)
Calcium: 9.9 mg/dL (ref 8.4–10.4)
Creatinine: 0.7 mg/dL (ref 0.7–1.3)
GLUCOSE: 101 mg/dL (ref 70–140)
POTASSIUM: 4.2 meq/L (ref 3.5–5.1)
SODIUM: 138 meq/L (ref 136–145)
Total Bilirubin: 0.7 mg/dL (ref 0.20–1.20)
Total Protein: 7 g/dL (ref 6.4–8.3)

## 2017-11-11 ENCOUNTER — Encounter (HOSPITAL_COMMUNITY): Payer: Self-pay

## 2017-11-17 ENCOUNTER — Other Ambulatory Visit (HOSPITAL_BASED_OUTPATIENT_CLINIC_OR_DEPARTMENT_OTHER): Payer: Medicare Other

## 2017-11-17 DIAGNOSIS — C3490 Malignant neoplasm of unspecified part of unspecified bronchus or lung: Secondary | ICD-10-CM

## 2017-11-17 DIAGNOSIS — C3492 Malignant neoplasm of unspecified part of left bronchus or lung: Secondary | ICD-10-CM | POA: Diagnosis not present

## 2017-11-17 LAB — CBC WITH DIFFERENTIAL/PLATELET
BASO%: 0.8 % (ref 0.0–2.0)
Basophils Absolute: 0.1 10*3/uL (ref 0.0–0.1)
EOS%: 0.9 % (ref 0.0–7.0)
Eosinophils Absolute: 0.1 10*3/uL (ref 0.0–0.5)
HEMATOCRIT: 25.4 % — AB (ref 38.4–49.9)
HEMOGLOBIN: 8.1 g/dL — AB (ref 13.0–17.1)
LYMPH%: 23 % (ref 14.0–49.0)
MCH: 31.2 pg (ref 27.2–33.4)
MCHC: 31.9 g/dL — ABNORMAL LOW (ref 32.0–36.0)
MCV: 97.7 fL (ref 79.3–98.0)
MONO#: 0.5 10*3/uL (ref 0.1–0.9)
MONO%: 7.3 % (ref 0.0–14.0)
NEUT%: 68 % (ref 39.0–75.0)
NEUTROS ABS: 4.4 10*3/uL (ref 1.5–6.5)
PLATELETS: 45 10*3/uL — AB (ref 140–400)
RBC: 2.6 10*6/uL — ABNORMAL LOW (ref 4.20–5.82)
RDW: 16.7 % — AB (ref 11.0–14.6)
WBC: 6.4 10*3/uL (ref 4.0–10.3)
lymph#: 1.5 10*3/uL (ref 0.9–3.3)

## 2017-11-17 LAB — COMPREHENSIVE METABOLIC PANEL
ALT: 15 U/L (ref 0–55)
ANION GAP: 7 meq/L (ref 3–11)
AST: 18 U/L (ref 5–34)
Albumin: 3.7 g/dL (ref 3.5–5.0)
Alkaline Phosphatase: 120 U/L (ref 40–150)
BUN: 12.1 mg/dL (ref 7.0–26.0)
CALCIUM: 9.9 mg/dL (ref 8.4–10.4)
CO2: 35 mEq/L — ABNORMAL HIGH (ref 22–29)
Chloride: 97 mEq/L — ABNORMAL LOW (ref 98–109)
Creatinine: 0.7 mg/dL (ref 0.7–1.3)
EGFR: >60 mL/min/{1.73_m2} (ref >60)
Glucose: 85 mg/dl (ref 70–140)
POTASSIUM: 3.8 meq/L (ref 3.5–5.1)
Sodium: 138 mEq/L (ref 136–145)
Total Bilirubin: 0.24 mg/dL (ref 0.20–1.20)
Total Protein: 6.9 g/dL (ref 6.4–8.3)

## 2017-11-18 ENCOUNTER — Encounter (HOSPITAL_COMMUNITY): Payer: Self-pay

## 2017-11-20 ENCOUNTER — Encounter (HOSPITAL_COMMUNITY): Payer: Self-pay

## 2017-11-20 ENCOUNTER — Telehealth: Payer: Self-pay | Admitting: Medical Oncology

## 2017-11-20 DIAGNOSIS — D649 Anemia, unspecified: Secondary | ICD-10-CM

## 2017-11-20 NOTE — Telephone Encounter (Signed)
Explained cbc/diff.clot to hold added to Mondays labs. Requested blood transfusion appt for Monday .

## 2017-11-24 ENCOUNTER — Other Ambulatory Visit (HOSPITAL_BASED_OUTPATIENT_CLINIC_OR_DEPARTMENT_OTHER): Payer: Medicare Other

## 2017-11-24 ENCOUNTER — Ambulatory Visit (HOSPITAL_BASED_OUTPATIENT_CLINIC_OR_DEPARTMENT_OTHER): Payer: Medicare Other

## 2017-11-24 ENCOUNTER — Encounter: Payer: Self-pay | Admitting: Oncology

## 2017-11-24 ENCOUNTER — Ambulatory Visit (HOSPITAL_COMMUNITY)
Admission: RE | Admit: 2017-11-24 | Discharge: 2017-11-24 | Disposition: A | Discharge status: Home / Self Care | Payer: Medicare Other | Source: Ambulatory Visit | Attending: Internal Medicine | Admitting: Internal Medicine

## 2017-11-24 ENCOUNTER — Ambulatory Visit (HOSPITAL_BASED_OUTPATIENT_CLINIC_OR_DEPARTMENT_OTHER): Payer: Medicare Other | Admitting: Oncology

## 2017-11-24 VITALS — BP 127/61 | HR 82 | Temp 98.2°F | Resp 16

## 2017-11-24 VITALS — BP 89/50 | HR 84 | Temp 97.7°F | Resp 17 | Ht 69.0 in | Wt 116.1 lb

## 2017-11-24 DIAGNOSIS — D6481 Anemia due to antineoplastic chemotherapy: Secondary | ICD-10-CM

## 2017-11-24 DIAGNOSIS — T451X5A Adverse effect of antineoplastic and immunosuppressive drugs, initial encounter: Secondary | ICD-10-CM | POA: Insufficient documentation

## 2017-11-24 DIAGNOSIS — D649 Anemia, unspecified: Secondary | ICD-10-CM

## 2017-11-24 DIAGNOSIS — C3492 Malignant neoplasm of unspecified part of left bronchus or lung: Secondary | ICD-10-CM

## 2017-11-24 DIAGNOSIS — C7951 Secondary malignant neoplasm of bone: Secondary | ICD-10-CM | POA: Diagnosis not present

## 2017-11-24 DIAGNOSIS — C3490 Malignant neoplasm of unspecified part of unspecified bronchus or lung: Secondary | ICD-10-CM

## 2017-11-24 DIAGNOSIS — C7800 Secondary malignant neoplasm of unspecified lung: Secondary | ICD-10-CM

## 2017-11-24 DIAGNOSIS — Z5111 Encounter for antineoplastic chemotherapy: Secondary | ICD-10-CM

## 2017-11-24 LAB — CBC WITH DIFFERENTIAL/PLATELET
BASO%: 0.1 % (ref 0.0–2.0)
Basophils Absolute: 0 10*3/uL (ref 0.0–0.1)
EOS%: 1.6 % (ref 0.0–7.0)
Eosinophils Absolute: 0.2 10*3/uL (ref 0.0–0.5)
HEMATOCRIT: 25.4 % — AB (ref 38.4–49.9)
HGB: 8.3 g/dL — ABNORMAL LOW (ref 13.0–17.1)
LYMPH#: 1.7 10*3/uL (ref 0.9–3.3)
LYMPH%: 12.9 % — ABNORMAL LOW (ref 14.0–49.0)
MCH: 31.4 pg (ref 27.2–33.4)
MCHC: 32.6 g/dL (ref 32.0–36.0)
MCV: 96.2 fL (ref 79.3–98.0)
MONO#: 0.8 10*3/uL (ref 0.1–0.9)
MONO%: 5.7 % (ref 0.0–14.0)
NEUT%: 79.7 % — AB (ref 39.0–75.0)
NEUTROS ABS: 10.5 10*3/uL — AB (ref 1.5–6.5)
PLATELETS: 307 10*3/uL (ref 140–400)
RBC: 2.64 10*6/uL — ABNORMAL LOW (ref 4.20–5.82)
RDW: 18.1 % — ABNORMAL HIGH (ref 11.0–14.6)
WBC: 13.2 10*3/uL — AB (ref 4.0–10.3)

## 2017-11-24 LAB — COMPREHENSIVE METABOLIC PANEL
ALBUMIN: 3.7 g/dL (ref 3.5–5.0)
ALT: 11 U/L (ref 0–55)
ANION GAP: 9 meq/L (ref 3–11)
AST: 17 U/L (ref 5–34)
Alkaline Phosphatase: 108 U/L (ref 40–150)
BILIRUBIN TOTAL: 0.22 mg/dL (ref 0.20–1.20)
BUN: 12.2 mg/dL (ref 7.0–26.0)
CALCIUM: 9.8 mg/dL (ref 8.4–10.4)
CO2: 33 mEq/L — ABNORMAL HIGH (ref 22–29)
CREATININE: 0.8 mg/dL (ref 0.7–1.3)
Chloride: 98 mEq/L (ref 98–109)
EGFR: >60 mL/min/{1.73_m2} (ref >60)
Glucose: 97 mg/dl (ref 70–140)
Potassium: 4 mEq/L (ref 3.5–5.1)
Sodium: 141 mEq/L (ref 136–145)
TOTAL PROTEIN: 6.9 g/dL (ref 6.4–8.3)

## 2017-11-24 LAB — PREPARE RBC (CROSSMATCH)

## 2017-11-24 MED ORDER — ACETAMINOPHEN 325 MG PO TABS
650.0000 mg | ORAL_TABLET | Freq: Once | ORAL | Status: AC
  Administered 2017-11-24: 650 mg via ORAL

## 2017-11-24 MED ORDER — DEXAMETHASONE SODIUM PHOSPHATE 10 MG/ML IJ SOLN
10.0000 mg | Freq: Once | INTRAMUSCULAR | Status: AC
  Administered 2017-11-24: 10 mg via INTRAVENOUS

## 2017-11-24 MED ORDER — DIPHENHYDRAMINE HCL 25 MG PO CAPS
ORAL_CAPSULE | ORAL | Status: AC
  Filled 2017-11-24: qty 1

## 2017-11-24 MED ORDER — PALONOSETRON HCL INJECTION 0.25 MG/5ML
INTRAVENOUS | Status: AC
  Filled 2017-11-24: qty 5

## 2017-11-24 MED ORDER — SODIUM CHLORIDE 0.9 % IV SOLN
376.0000 mg | Freq: Once | INTRAVENOUS | Status: AC
  Administered 2017-11-24: 380 mg via INTRAVENOUS
  Filled 2017-11-24: qty 38

## 2017-11-24 MED ORDER — DIPHENHYDRAMINE HCL 25 MG PO CAPS
25.0000 mg | ORAL_CAPSULE | Freq: Once | ORAL | Status: AC
  Administered 2017-11-24: 25 mg via ORAL

## 2017-11-24 MED ORDER — SODIUM CHLORIDE 0.9 % IV SOLN
100.0000 mg/m2 | Freq: Once | INTRAVENOUS | Status: AC
  Administered 2017-11-24: 160 mg via INTRAVENOUS
  Filled 2017-11-24: qty 8

## 2017-11-24 MED ORDER — DEXAMETHASONE SODIUM PHOSPHATE 10 MG/ML IJ SOLN
INTRAMUSCULAR | Status: AC
Start: 2017-11-24 — End: ?
  Filled 2017-11-24: qty 1

## 2017-11-24 MED ORDER — ACETAMINOPHEN 325 MG PO TABS
ORAL_TABLET | ORAL | Status: AC
  Filled 2017-11-24: qty 2

## 2017-11-24 MED ORDER — SODIUM CHLORIDE 0.9 % IV SOLN
Freq: Once | INTRAVENOUS | Status: AC
  Administered 2017-11-24: 12:00:00 via INTRAVENOUS

## 2017-11-24 MED ORDER — PALONOSETRON HCL INJECTION 0.25 MG/5ML
0.2500 mg | Freq: Once | INTRAVENOUS | Status: AC
  Administered 2017-11-24: 0.25 mg via INTRAVENOUS

## 2017-11-24 NOTE — Assessment & Plan Note (Signed)
This is a very pleasant 71 year old white male recently diagnosed with extensive stage small cell lung cancer and currently undergoing systemic chemotherapy with carboplatin and etoposide status post 3 cycles. The patient has been tolerating this treatment fairly well with the exception of fatigue. Recommend for the patient to proceed with cycle #4 today as a schedule.  The patient will have a restaging CT scan of the chest, abdomen, pelvis prior to his next cycle of chemotherapy.  He will seen back for follow-up in 3 weeks for evaluation prior to cycle #5 and to review his restaging CT scan results.  For the chemotherapy-induced anemia, I will arrange for the patient to receive 2 units of PRBCs transfusion this week.  He was advised to call immediately if he has any concerning symptoms in the interval. The patient voices understanding of current disease status and treatment options and is in agreement with the current care plan.  All questions were answered. The patient knows to call the clinic with any problems, questions or concerns. We can certainly see the patient much sooner if necessary.

## 2017-11-24 NOTE — Patient Instructions (Signed)
[facility] Discharge Instructions for Patients Receiving Chemotherapy  Today you received the following chemotherapy agents carboplatin/etoposide   To help prevent nausea and vomiting after your treatment, we encourage you to take your nausea medication as directed  If you develop nausea and vomiting that is not controlled by your nausea medication, call the clinic.   BELOW ARE SYMPTOMS THAT SHOULD BE REPORTED IMMEDIATELY:  *FEVER GREATER THAN 100.5 F  *CHILLS WITH OR WITHOUT FEVER  NAUSEA AND VOMITING THAT IS NOT CONTROLLED WITH YOUR NAUSEA MEDICATION  *UNUSUAL SHORTNESS OF BREATH  *UNUSUAL BRUISING OR BLEEDING  TENDERNESS IN MOUTH AND THROAT WITH OR WITHOUT PRESENCE OF ULCERS  *URINARY PROBLEMS  *BOWEL PROBLEMS  UNUSUAL RASH Items with * indicate a potential emergency and should be followed up as soon as possible.  Feel free to call the clinic you have any questions or concerns. The clinic phone number is [phone].

## 2017-11-24 NOTE — Progress Notes (Signed)
[place] OFFICE PROGRESS NOTE  [name], PA-C [address] [place] [address]  DIAGNOSIS: Extensive stage (T3, N3, M1c) small cell lung cancer presented with widespread malignancy including nodal stations, bones, left pleural space and lungs diagnosed in September 2018.  PRIOR THERAPY: None  CURRENT THERAPY: Systemic chemotherapy with carboplatin for AUC 5 on day 1 and etoposide 100 mg/M2 on days 1, 2 and 3 with Neulasta support.  Status post 3 cycles.  INTERVAL HISTORY: Jose Mckinney 71 y.o. male returns for routine follow-up visit accompanied by his wife and son.  The patient is feeling fine today except for fatigue as well as his baseline shortness of breath which is increased with exertion.  He continues to tolerate his chemotherapy well overall.  Denies fevers and chills.  Denies chest pain, cough, hemoptysis.  Denies nausea, vomiting, constipation, diarrhea.  The patient is here for evaluation prior to cycle 3 of his chemotherapy.  MEDICAL HISTORY: Past Medical History:  Diagnosis Date  . Anxiety   . CHF (congestive heart failure) (HCC)    x 3 episodes- [place]. pt. living here since 11-'1-16  . COPD with emphysema ([place])   . Depression   . Dyspnea   . HTN (hypertension)   . On home oxygen therapy   . Pulmonary HTN (HCC)    Dr. Marlyn Corporal is following.  . Recurrent left pleural effusion   . Sciatic leg pain    INTERMITTENT- occ. uses Hydrocodone as needed  . Seasonal allergies   . Small cell lung cancer, left ([place]) 09/15/2017    ALLERGIES:  has No Known Allergies.  MEDICATIONS:  Current Outpatient Medications  Medication Sig Dispense Refill  . albuterol (PROVENTIL) (2.5 MG/3ML) 0.083% nebulizer solution Take 3 mLs (2.5 mg total) by nebulization every 6 (six) hours as needed for wheezing or shortness of breath. 75 mL 12  . dronabinol (MARINOL) 2.5 MG capsule Take 1 capsule (2.5 mg total) by mouth 2 (two) times daily before a  meal. 60 capsule 0  . feeding supplement (BOOST HIGH PROTEIN) LIQD Take 1 Container by mouth daily.     . fluticasone (FLONASE) 50 MCG/ACT nasal spray Place 2 sprays into both nostrils daily as needed for allergies or rhinitis.    Marland Kitchen ibuprofen (ADVIL,MOTRIN) 200 MG tablet Take 600 mg by mouth 3 (three) times daily as needed.    Marland Kitchen ipratropium (ATROVENT) 0.02 % nebulizer solution Take 2.5 mLs (0.5 mg total) by nebulization 4 (four) times daily. 75 mL 12  . LORazepam (ATIVAN) 0.5 MG tablet Take 1 tablet (0.5 mg total) by mouth every 8 (eight) hours. Take 1 tab 1 hour prior to MRI. May repeat X1 at time of MRI. 2 tablet 0  . prochlorperazine (COMPAZINE) 10 MG tablet Take 1 tablet (10 mg total) by mouth every 6 (six) hours as needed for nausea or vomiting. 30 tablet 1  . sertraline (ZOLOFT) 50 MG tablet Take 50 mg by mouth 2 (two) times daily.    . sildenafil (REVATIO) 20 MG tablet Take 1 tablet (20 mg total) by mouth 3 (three) times daily. 90 tablet 6  . tiotropium (SPIRIVA HANDIHALER) 18 MCG inhalation capsule Place 1 capsule (18 mcg total) into inhaler and inhale daily. 30 capsule 12  . torsemide (DEMADEX) 20 MG tablet Take 0.5 tablets (10 mg total) by mouth as needed (SWELLING, SOB).     No current facility-administered medications for this visit.     SURGICAL HISTORY:  Past Surgical  History:  Procedure Laterality Date  . ANGIOPLASTY    . CARDIAC CATHETERIZATION     '15- [place]- no issues now.  . COLONOSCOPY WITH PROPOFOL N/A 02/09/2016   Procedure: COLONOSCOPY WITH PROPOFOL;  Surgeon: Carol Ada, MD;  Location: [facility];  Service: Endoscopy;  Laterality: N/A;  . IR THORACENTESIS ASP PLEURAL SPACE W/IMG GUIDE  08/08/2017  . MASTOIDECTOMY Left   . TONSILLECTOMY      REVIEW OF SYSTEMS:   Review of Systems  Constitutional: Negative for appetite change, chills, fever and unexpected weight change. Positive for fatigue. HENT:   Negative for mouth sores, nosebleeds, sore throat  and trouble swallowing.   Eyes: Negative for eye problems and icterus.  Respiratory: Negative for cough, hemoptysis, and wheezing.  Positive for baseline shortness of breath.  Cardiovascular: Negative for chest pain and leg swelling.  Gastrointestinal: Negative for abdominal pain, constipation, diarrhea, nausea and vomiting.  Genitourinary: Negative for bladder incontinence, difficulty urinating, dysuria, frequency and hematuria.   Musculoskeletal: Negative for back pain, gait problem, neck pain and neck stiffness.  Skin: Negative for itching and rash.  Neurological: Negative for dizziness, extremity weakness, gait problem, headaches, light-headedness and seizures.  Hematological: Negative for adenopathy. Does not bruise/bleed easily.  Psychiatric/Behavioral: Negative for confusion, depression and sleep disturbance. The patient is not nervous/anxious.     PHYSICAL EXAMINATION:  Blood pressure (!) 89/50, pulse 84, temperature 97.7 F (36.5 C), temperature source Oral, resp. rate 17, height 5\' 9"  (1.753 m), weight 116 lb 1.6 oz (52.7 kg), SpO2 94 %.  ECOG PERFORMANCE STATUS: 1 - Symptomatic but completely ambulatory  Physical Exam  Constitutional: Oriented to person, place, and time. No distress.  HENT:  Head: Normocephalic and atraumatic.  Mouth/Throat: Oropharynx is clear and moist. No oropharyngeal exudate.  Eyes: Conjunctivae are normal. Right eye exhibits no discharge. Left eye exhibits no discharge. No scleral icterus.  Neck: Normal range of motion. Neck supple.  Cardiovascular: Normal rate, regular rhythm, normal heart sounds and intact distal pulses.   Pulmonary/Chest: Effort normal. No respiratory distress. No wheezes. No rales. Diminished breath sounds bilaterally. Abdominal: Soft. Bowel sounds are normal. Exhibits no distension and no mass. There is no tenderness.  Musculoskeletal: Normal range of motion. Exhibits no edema.  Lymphadenopathy:    No cervical adenopathy.   Neurological: Alert and oriented to person, place, and time. Exhibits normal muscle tone. Gait normal. Coordination normal.  Skin: Skin is warm and dry. No rash noted. Not diaphoretic. No erythema. No pallor.  Psychiatric: Mood, memory and judgment normal.  Vitals reviewed.  LABORATORY DATA: Lab Results  Component Value Date   WBC 13.2 (H) 11/24/2017   HGB 8.3 (L) 11/24/2017   HCT 25.4 (L) 11/24/2017   MCV 96.2 11/24/2017   PLT 307 11/24/2017      Chemistry      Component Value Date/Time   NA 141 11/24/2017 1014   K 4.0 11/24/2017 1014   CL 95 (L) 09/04/2017 1145   CO2 33 (H) 11/24/2017 1014   BUN 12.2 11/24/2017 1014   CREATININE 0.8 11/24/2017 1014      Component Value Date/Time   CALCIUM 9.8 11/24/2017 1014   ALKPHOS 108 11/24/2017 1014   AST 17 11/24/2017 1014   ALT 11 11/24/2017 1014   BILITOT 0.22 11/24/2017 1014       RADIOGRAPHIC STUDIES:  Ct Chest W Contrast  Result Date: 10/31/2017 CLINICAL DATA:  Followup small cell lung cancer EXAM: CT CHEST, ABDOMEN, AND PELVIS WITH CONTRAST TECHNIQUE: Multidetector  CT imaging of the chest, abdomen and pelvis was performed following the standard protocol during bolus administration of intravenous contrast. CONTRAST:  139mL ISOVUE-300 IOPAMIDOL (ISOVUE-300) INJECTION 61% COMPARISON:  PET-CT from 08/27/2017. FINDINGS: CT CHEST FINDINGS Cardiovascular: The heart size is mildly enlarged. Aortic atherosclerosis noted. Calcification within the LAD and RCA coronary artery identified. No pericardial effusion. Mediastinum/Nodes: The index left supraclavicular node measures 0.9 cm, image 9 of series 2. Previously 1.4 cm. Previous index lymph node within the posterior mediastinum adjacent to the esophagus has resolved in the interval. Within the prevascular portion of the anterior mediastinum there is a 7 mm lymph node, image 23 of series 2. Previously 1.1 cm. 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 The appearance is unchanged from previous exam. Enhancing nodularity within the posterior right lower lobe measures 2.4 by 2.4 cm, image 37 of series 2. Previously this measured the same. Index right lower lobe lung nodule measures 0.8 cm, image 109 of series 6. Previously 0.9 cm. Musculoskeletal: 2.1 cm sclerotic lesion within the left humeral head corresponds to previous lytic metastasis, image 10 of series 6. Compatible with interval healing. Mixed lytic and sclerotic bone lesion within the posterior T10 vertebral body is identified, image 70 of series 5. Previously there was a pure lytic lesion. Within the mid body of the sternum there is a sclerotic lesion measuring 1.5 cm, image 76 of series 5. This corresponds to a previous lytic lesion. CT ABDOMEN PELVIS FINDINGS Hepatobiliary: Medial left lobe of liver cyst is unchanged measuring 5 mm. No suspicious liver lesions identified. Gallbladder normal. No significant biliary dilatation. Pancreas: Fluid attenuating lesion within the head of pancreas measures 6 mm and is unchanged from previous exam. Not hypermetabolic on previous PET-CT. No pancreatic inflammation identified. Spleen: The spleen measures 10 cm in length. No focal splenic abnormality. Adrenals/Urinary Tract: Normal appearance of the right adrenal gland. Similar size of left left adrenal nodule measuring 1.9 cm. Previously hypermetabolic. No kidney mass or hydronephrosis. Nonobstructing left scratch set nonobstructing bilateral renal calculi noted. Urinary bladder is normal Stomach/Bowel: Small hiatal hernia. No pathologic dilatation of the small bowel loops. Distal colonic diverticulosis without acute inflammation. Vascular/Lymphatic: Aortic atherosclerosis. Index left periaortic node measures 9 mm, image 66 of series 2. Previously 1.4 cm. Left periaortic node Measures 9 mm, image 73 of series 2. Previously 1.3 cm. Reproductive: Prostate is unremarkable. Other: No abdominal  wall hernia or abnormality. No abdominopelvic ascites. Musculoskeletal: There is areas of increased scleroses corresponding to previous lytic bone metastases compatible with interval healing. IMPRESSION: 1. Interval response to therapy. Thoracic and abdominal nodal metastases have decreased in size from previous exam. 2. Multifocal lytic bone metastases now appear more sclerotic compatible with interval healing. 3. No change in chronic bilateral partially calcified fibrothoraces 4.  Aortic Atherosclerosis (ICD10-I70.0). 5. Small hiatal hernia Electronically Signed   By: Kerby Moors M.D.   On: 10/31/2017 17:04   Ct Abdomen Pelvis W Contrast  Result Date: 10/31/2017 CLINICAL DATA:  Followup small cell lung cancer EXAM: CT CHEST, ABDOMEN, AND PELVIS WITH CONTRAST TECHNIQUE: Multidetector CT imaging of the chest, abdomen and pelvis was performed following the standard protocol during bolus administration of intravenous contrast. CONTRAST:  119mL ISOVUE-300 IOPAMIDOL (ISOVUE-300) INJECTION 61% COMPARISON:  PET-CT from 08/27/2017. FINDINGS: CT CHEST FINDINGS Cardiovascular: The heart size is mildly enlarged. Aortic atherosclerosis noted. Calcification within the LAD and RCA coronary artery identified. No pericardial effusion. Mediastinum/Nodes: The index left supraclavicular node measures 0.9 cm, image  9 of series 2. Previously 1.4 cm. Previous index lymph node within the posterior mediastinum adjacent to the esophagus has resolved in the interval. Within the prevascular portion of the anterior mediastinum there is a 7 mm lymph node, image 23 of series 2. Previously 1.1 cm. 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 The appearance is unchanged from previous exam. Enhancing nodularity within the posterior right lower lobe measures 2.4 by 2.4 cm, image 37 of series 2. Previously this measured the same. Index right lower lobe lung nodule measures 0.8 cm,  image 109 of series 6. Previously 0.9 cm. Musculoskeletal: 2.1 cm sclerotic lesion within the left humeral head corresponds to previous lytic metastasis, image 10 of series 6. Compatible with interval healing. Mixed lytic and sclerotic bone lesion within the posterior T10 vertebral body is identified, image 70 of series 5. Previously there was a pure lytic lesion. Within the mid body of the sternum there is a sclerotic lesion measuring 1.5 cm, image 76 of series 5. This corresponds to a previous lytic lesion. CT ABDOMEN PELVIS FINDINGS Hepatobiliary: Medial left lobe of liver cyst is unchanged measuring 5 mm. No suspicious liver lesions identified. Gallbladder normal. No significant biliary dilatation. Pancreas: Fluid attenuating lesion within the head of pancreas measures 6 mm and is unchanged from previous exam. Not hypermetabolic on previous PET-CT. No pancreatic inflammation identified. Spleen: The spleen measures 10 cm in length. No focal splenic abnormality. Adrenals/Urinary Tract: Normal appearance of the right adrenal gland. Similar size of left left adrenal nodule measuring 1.9 cm. Previously hypermetabolic. No kidney mass or hydronephrosis. Nonobstructing left scratch set nonobstructing bilateral renal calculi noted. Urinary bladder is normal Stomach/Bowel: Small hiatal hernia. No pathologic dilatation of the small bowel loops. Distal colonic diverticulosis without acute inflammation. Vascular/Lymphatic: Aortic atherosclerosis. Index left periaortic node measures 9 mm, image 66 of series 2. Previously 1.4 cm. Left periaortic node Measures 9 mm, image 73 of series 2. Previously 1.3 cm. Reproductive: Prostate is unremarkable. Other: No abdominal wall hernia or abnormality. No abdominopelvic ascites. Musculoskeletal: There is areas of increased scleroses corresponding to previous lytic bone metastases compatible with interval healing. IMPRESSION: 1. Interval response to therapy. Thoracic and abdominal nodal  metastases have decreased in size from previous exam. 2. Multifocal lytic bone metastases now appear more sclerotic compatible with interval healing. 3. No change in chronic bilateral partially calcified fibrothoraces 4.  Aortic Atherosclerosis (ICD10-I70.0). 5. Small hiatal hernia Electronically Signed   By: Kerby Moors M.D.   On: 10/31/2017 17:04     ASSESSMENT/PLAN:  Small cell lung cancer, left [facility]) This is a very pleasant 71 year old white male recently diagnosed with extensive stage small cell lung cancer and currently undergoing systemic chemotherapy with carboplatin and etoposide status post 3 cycles. The patient has been tolerating this treatment fairly well with the exception of fatigue. Recommend for the patient to proceed with cycle #4 today as a schedule.  The patient will have a restaging CT scan of the chest, abdomen, pelvis prior to his next cycle of chemotherapy.  He will seen back for follow-up in 3 weeks for evaluation prior to cycle #5 and to review his restaging CT scan results.  For the chemotherapy-induced anemia, I will arrange for the patient to receive 2 units of PRBCs transfusion this week.  He was advised to call immediately if he has any concerning symptoms in the interval. The patient voices understanding of current disease status and treatment options and is in agreement  with the current care plan.  All questions were answered. The patient knows to call the clinic with any problems, questions or concerns. We can certainly see the patient much sooner if necessary.  Orders Placed This Encounter  Procedures  . CT ABDOMEN PELVIS W CONTRAST    Standing Status:   Future    Standing Expiration Date:   11/24/2018    Order Specific Question:   If indicated for the ordered procedure, I authorize the administration of contrast media per Radiology protocol    Answer:   Yes    Order Specific Question:   Preferred imaging location?    Answer:   [facility]     Order Specific Question:   Radiology Contrast Protocol - do NOT remove file path    Answer:   [email or web address]    Order Specific Question:   Reason for Exam additional comments    Answer:   Small cell lung cancer.  Restaging.  . CT CHEST W CONTRAST    Standing Status:   Future    Standing Expiration Date:   11/24/2018    Order Specific Question:   If indicated for the ordered procedure, I authorize the administration of contrast media per Radiology protocol    Answer:   Yes    Order Specific Question:   Preferred imaging location?    Answer:   [facility]    Order Specific Question:   Radiology Contrast Protocol - do NOT remove file path    Answer:   [email or web address]    Order Specific Question:   Reason for Exam additional comments    Answer:   Small cell lung cancer.  Restaging.  . Practitioner attestation of consent    I, the ordering practitioner, attest that I have discussed with the patient the benefits, risks, side effects, alternatives, likelihood of achieving goals and potential problems during recovery for the procedure listed.    Standing Status:   Future    Standing Expiration Date:   11/24/2018    Order Specific Question:   Procedure    Answer:   Blood Product(s)  . Complete patient signature process for consent form    Standing Status:   Future    Standing Expiration Date:   11/24/2018  . Care order/instruction    Transfuse Parameters    Standing Status:   Future    Standing Expiration Date:   11/24/2018  . Practitioner attestation of consent    I, the ordering practitioner, attest that I have discussed with the patient the benefits, risks, side effects, alternatives, likelihood of achieving goals and potential problems during recovery for the procedure listed.    Standing Status:   Future    Standing Expiration Date:   11/24/2018    Order Specific Question:   Procedure    Answer:   Blood Product(s)   . Complete patient signature process for consent form    Standing Status:   Future    Standing Expiration Date:   11/24/2018  . Care order/instruction    Transfuse Parameters    Standing Status:   Future    Standing Expiration Date:   11/24/2018  . Type and screen    Standing Status:   Future    Number of Occurrences:   1    Standing Expiration Date:   11/24/2018     Mikey Bussing, DNP, AGPCNP-BC, AOCNP 11/24/17

## 2017-11-25 ENCOUNTER — Encounter (HOSPITAL_COMMUNITY): Payer: Self-pay

## 2017-11-25 ENCOUNTER — Ambulatory Visit (HOSPITAL_BASED_OUTPATIENT_CLINIC_OR_DEPARTMENT_OTHER): Payer: Medicare Other

## 2017-11-25 ENCOUNTER — Other Ambulatory Visit: Payer: Self-pay | Admitting: *Deleted

## 2017-11-25 VITALS — BP 114/64 | HR 83 | Temp 98.3°F | Resp 20

## 2017-11-25 DIAGNOSIS — D6481 Anemia due to antineoplastic chemotherapy: Secondary | ICD-10-CM | POA: Diagnosis not present

## 2017-11-25 DIAGNOSIS — T451X5A Adverse effect of antineoplastic and immunosuppressive drugs, initial encounter: Secondary | ICD-10-CM

## 2017-11-25 DIAGNOSIS — C3492 Malignant neoplasm of unspecified part of left bronchus or lung: Secondary | ICD-10-CM

## 2017-11-25 DIAGNOSIS — Z5111 Encounter for antineoplastic chemotherapy: Secondary | ICD-10-CM | POA: Diagnosis present

## 2017-11-25 DIAGNOSIS — D649 Anemia, unspecified: Secondary | ICD-10-CM

## 2017-11-25 LAB — TYPE AND SCREEN
ABO/RH(D): A NEG
ANTIBODY SCREEN: NEGATIVE
UNIT DIVISION: 0
UNIT DIVISION: 0

## 2017-11-25 LAB — BPAM RBC
BLOOD PRODUCT EXPIRATION DATE: 201812062359
Blood Product Expiration Date: 201812062359
ISSUE DATE / TIME: 201812031230
UNIT TYPE AND RH: 600
UNIT TYPE AND RH: 600

## 2017-11-25 MED ORDER — ACETAMINOPHEN 325 MG PO TABS: 650.0000 mg | ORAL_TABLET | Freq: Once | ORAL | Status: DC

## 2017-11-25 MED ORDER — DEXAMETHASONE SODIUM PHOSPHATE 10 MG/ML IJ SOLN
10.0000 mg | Freq: Once | INTRAMUSCULAR | Status: AC
  Administered 2017-11-25: 10 mg via INTRAVENOUS

## 2017-11-25 MED ORDER — DEXAMETHASONE SODIUM PHOSPHATE 10 MG/ML IJ SOLN
INTRAMUSCULAR | Status: AC
  Filled 2017-11-25: qty 1

## 2017-11-25 MED ORDER — SODIUM CHLORIDE 0.9 % IV SOLN
Freq: Once | INTRAVENOUS | Status: AC
  Administered 2017-11-25: 13:00:00 via INTRAVENOUS

## 2017-11-25 MED ORDER — SODIUM CHLORIDE 0.9 % IV SOLN
100.0000 mg/m2 | Freq: Once | INTRAVENOUS | Status: AC
  Administered 2017-11-25: 160 mg via INTRAVENOUS
  Filled 2017-11-25: qty 8

## 2017-11-25 MED ORDER — DIPHENHYDRAMINE HCL 25 MG PO CAPS: 25.0000 mg | ORAL_CAPSULE | Freq: Once | ORAL | Status: DC

## 2017-11-25 NOTE — Patient Instructions (Signed)
[place] Discharge Instructions for Patients Receiving Chemotherapy  Today you received the following chemotherapy agents Etoposide  To help prevent nausea and vomiting after your treatment, we encourage you to take your nausea medication as directed.   If you develop nausea and vomiting that is not controlled by your nausea medication, call the clinic.   BELOW ARE SYMPTOMS THAT SHOULD BE REPORTED IMMEDIATELY:  *FEVER GREATER THAN 100.5 F  *CHILLS WITH OR WITHOUT FEVER  NAUSEA AND VOMITING THAT IS NOT CONTROLLED WITH YOUR NAUSEA MEDICATION  *UNUSUAL SHORTNESS OF BREATH  *UNUSUAL BRUISING OR BLEEDING  TENDERNESS IN MOUTH AND THROAT WITH OR WITHOUT PRESENCE OF ULCERS  *URINARY PROBLEMS  *BOWEL PROBLEMS  UNUSUAL RASH Items with * indicate a potential emergency and should be followed up as soon as possible.  Feel free to call the clinic should you have any questions or concerns. The clinic phone number is [phone].  Please show the [place] at check-in to the Emergency Department and triage nurse.

## 2017-11-26 ENCOUNTER — Ambulatory Visit (HOSPITAL_BASED_OUTPATIENT_CLINIC_OR_DEPARTMENT_OTHER): Payer: Medicare Other

## 2017-11-26 VITALS — BP 133/74 | HR 86 | Temp 97.8°F | Resp 18

## 2017-11-26 DIAGNOSIS — D649 Anemia, unspecified: Secondary | ICD-10-CM

## 2017-11-26 DIAGNOSIS — Z5111 Encounter for antineoplastic chemotherapy: Secondary | ICD-10-CM

## 2017-11-26 DIAGNOSIS — Z5189 Encounter for other specified aftercare: Secondary | ICD-10-CM

## 2017-11-26 DIAGNOSIS — C3492 Malignant neoplasm of unspecified part of left bronchus or lung: Secondary | ICD-10-CM

## 2017-11-26 DIAGNOSIS — D6481 Anemia due to antineoplastic chemotherapy: Secondary | ICD-10-CM | POA: Diagnosis not present

## 2017-11-26 MED ORDER — ETOPOSIDE CHEMO INJECTION 1 GM/50ML
100.0000 mg/m2 | Freq: Once | INTRAVENOUS | Status: AC
  Administered 2017-11-26: 160 mg via INTRAVENOUS
  Filled 2017-11-26: qty 8

## 2017-11-26 MED ORDER — SODIUM CHLORIDE 0.9 % IV SOLN
Freq: Once | INTRAVENOUS | Status: AC
  Administered 2017-11-26: 11:00:00 via INTRAVENOUS

## 2017-11-26 MED ORDER — DEXAMETHASONE SODIUM PHOSPHATE 10 MG/ML IJ SOLN
10.0000 mg | Freq: Once | INTRAMUSCULAR | Status: AC
  Administered 2017-11-26: 10 mg via INTRAVENOUS

## 2017-11-26 MED ORDER — PEGFILGRASTIM 6 MG/0.6ML ~~LOC~~ PSKT
6.0000 mg | PREFILLED_SYRINGE | Freq: Once | SUBCUTANEOUS | Status: AC
  Administered 2017-11-26: 6 mg via SUBCUTANEOUS
  Filled 2017-11-26: qty 0.6

## 2017-11-26 MED ORDER — SODIUM CHLORIDE 0.9 % IV SOLN: 250.0000 mL | Freq: Once | INTRAVENOUS | Status: DC

## 2017-11-26 MED ORDER — DEXAMETHASONE SODIUM PHOSPHATE 10 MG/ML IJ SOLN
INTRAMUSCULAR | Status: AC
  Filled 2017-11-26: qty 1

## 2017-11-26 NOTE — Progress Notes (Signed)
Pt tolerated blood transfusion without benadryl and tylenol. Pt states that pre meds makes his legs restless. In no acute distress throughout blood transfusion.

## 2017-11-26 NOTE — Patient Instructions (Signed)
[place] Discharge Instructions for Patients Receiving Chemotherapy  Today you received the following chemotherapy agent: Etoposide   To help prevent nausea and vomiting after your treatment, we encourage you to take your nausea medication as directed  If you develop nausea and vomiting that is not controlled by your nausea medication, call the clinic.   BELOW ARE SYMPTOMS THAT SHOULD BE REPORTED IMMEDIATELY:  *FEVER GREATER THAN 100.5 F  *CHILLS WITH OR WITHOUT FEVER  NAUSEA AND VOMITING THAT IS NOT CONTROLLED WITH YOUR NAUSEA MEDICATION  *UNUSUAL SHORTNESS OF BREATH  *UNUSUAL BRUISING OR BLEEDING  TENDERNESS IN MOUTH AND THROAT WITH OR WITHOUT PRESENCE OF ULCERS  *URINARY PROBLEMS  *BOWEL PROBLEMS  UNUSUAL RASH Items with * indicate a potential emergency and should be followed up as soon as possible.  Feel free to call the clinic you have any questions or concerns. The clinic phone number is [phone].

## 2017-11-27 ENCOUNTER — Encounter (HOSPITAL_COMMUNITY): Payer: Self-pay

## 2017-11-27 LAB — BPAM RBC
Blood Product Expiration Date: 201812062359
ISSUE DATE / TIME: 201812051318
UNIT TYPE AND RH: 600

## 2017-11-27 LAB — TYPE AND SCREEN
ABO/RH(D): A NEG
Antibody Screen: NEGATIVE
Unit division: 0

## 2017-12-01 ENCOUNTER — Other Ambulatory Visit: Payer: Medicare Other

## 2017-12-02 ENCOUNTER — Encounter (HOSPITAL_COMMUNITY): Payer: Self-pay

## 2017-12-04 ENCOUNTER — Encounter (HOSPITAL_COMMUNITY): Payer: Self-pay

## 2017-12-08 ENCOUNTER — Emergency Department (HOSPITAL_COMMUNITY): Payer: Medicare Other

## 2017-12-08 ENCOUNTER — Emergency Department (HOSPITAL_COMMUNITY)
Admission: EM | Admit: 2017-12-08 | Discharge: 2017-12-08 | Disposition: A | Discharge status: Home / Self Care | Payer: Medicare Other | Attending: Emergency Medicine | Admitting: Emergency Medicine

## 2017-12-08 ENCOUNTER — Other Ambulatory Visit: Payer: Medicare Other

## 2017-12-08 ENCOUNTER — Other Ambulatory Visit: Payer: Self-pay

## 2017-12-08 ENCOUNTER — Telehealth: Payer: Self-pay

## 2017-12-08 DIAGNOSIS — I10 Essential (primary) hypertension: Secondary | ICD-10-CM | POA: Insufficient documentation

## 2017-12-08 DIAGNOSIS — Z85118 Personal history of other malignant neoplasm of bronchus and lung: Secondary | ICD-10-CM | POA: Insufficient documentation

## 2017-12-08 DIAGNOSIS — Z79899 Other long term (current) drug therapy: Secondary | ICD-10-CM | POA: Diagnosis not present

## 2017-12-08 DIAGNOSIS — J449 Chronic obstructive pulmonary disease, unspecified: Secondary | ICD-10-CM | POA: Insufficient documentation

## 2017-12-08 DIAGNOSIS — I509 Heart failure, unspecified: Secondary | ICD-10-CM | POA: Diagnosis not present

## 2017-12-08 DIAGNOSIS — Z87891 Personal history of nicotine dependence: Secondary | ICD-10-CM | POA: Insufficient documentation

## 2017-12-08 DIAGNOSIS — R0602 Shortness of breath: Secondary | ICD-10-CM | POA: Diagnosis present

## 2017-12-08 DIAGNOSIS — R05 Cough: Secondary | ICD-10-CM | POA: Insufficient documentation

## 2017-12-08 DIAGNOSIS — J441 Chronic obstructive pulmonary disease with (acute) exacerbation: Secondary | ICD-10-CM

## 2017-12-08 LAB — COMPREHENSIVE METABOLIC PANEL
ALK PHOS: 103 U/L (ref 38–126)
ALT: 19 U/L (ref 17–63)
AST: 28 U/L (ref 15–41)
Albumin: 3.7 g/dL (ref 3.5–5.0)
Anion gap: 8 (ref 5–15)
BUN: 18 mg/dL (ref 6–20)
CALCIUM: 9.3 mg/dL (ref 8.9–10.3)
CO2: 32 mmol/L (ref 22–32)
CREATININE: 0.74 mg/dL (ref 0.61–1.24)
Chloride: 93 mmol/L — ABNORMAL LOW (ref 101–111)
GFR calc non Af Amer: >60 mL/min (ref >60)
GLUCOSE: 125 mg/dL — AB (ref 65–99)
Potassium: 3.8 mmol/L (ref 3.5–5.1)
SODIUM: 133 mmol/L — AB (ref 135–145)
Total Bilirubin: 0.5 mg/dL (ref 0.3–1.2)
Total Protein: 6.6 g/dL (ref 6.5–8.1)

## 2017-12-08 LAB — CBC WITH DIFFERENTIAL/PLATELET
BASOS PCT: 0 %
Basophils Absolute: 0 10*3/uL (ref 0.0–0.1)
EOS ABS: 0 10*3/uL (ref 0.0–0.7)
EOS PCT: 0 %
HCT: 22.5 % — ABNORMAL LOW (ref 39.0–52.0)
Hemoglobin: 7.3 g/dL — ABNORMAL LOW (ref 13.0–17.0)
LYMPHS ABS: 0.8 10*3/uL (ref 0.7–4.0)
Lymphocytes Relative: 9 %
MCH: 31.9 pg (ref 26.0–34.0)
MCHC: 32.4 g/dL (ref 30.0–36.0)
MCV: 98.3 fL (ref 78.0–100.0)
MONO ABS: 0.5 10*3/uL (ref 0.1–1.0)
MONOS PCT: 6 %
NEUTROS PCT: 85 %
Neutro Abs: 7.5 10*3/uL (ref 1.7–7.7)
PLATELETS: 52 10*3/uL — AB (ref 150–400)
RBC: 2.29 MIL/uL — ABNORMAL LOW (ref 4.22–5.81)
RDW: 19.6 % — AB (ref 11.5–15.5)
WBC: 8.8 10*3/uL (ref 4.0–10.5)

## 2017-12-08 LAB — BRAIN NATRIURETIC PEPTIDE: B NATRIURETIC PEPTIDE 5: 313.9 pg/mL — AB (ref 0.0–100.0)

## 2017-12-08 LAB — I-STAT CG4 LACTIC ACID, ED
Lactic Acid, Venous: 2.11 mmol/L (ref 0.5–1.9)
Lactic Acid, Venous: 0.41 mmol/L — ABNORMAL LOW (ref 0.5–1.9)

## 2017-12-08 LAB — I-STAT TROPONIN, ED: TROPONIN I, POC: 0.04 ng/mL (ref 0.00–0.08)

## 2017-12-08 MED ORDER — PREDNISONE 10 MG PO TABS: 60.0000 mg | ORAL_TABLET | Freq: Every day | ORAL | 0 refills | Status: AC

## 2017-12-08 MED ORDER — LEVOFLOXACIN 500 MG PO TABS: 750.0000 mg | ORAL_TABLET | Freq: Every day | ORAL | 0 refills | Status: AC

## 2017-12-08 MED ORDER — LEVOFLOXACIN 750 MG PO TABS
750.0000 mg | ORAL_TABLET | Freq: Once | ORAL | Status: AC
  Administered 2017-12-08: 750 mg via ORAL
  Filled 2017-12-08: qty 1

## 2017-12-08 MED ORDER — PREDNISONE 20 MG PO TABS
60.0000 mg | ORAL_TABLET | Freq: Once | ORAL | Status: AC
  Administered 2017-12-08: 60 mg via ORAL
  Filled 2017-12-08: qty 3

## 2017-12-08 MED ORDER — IPRATROPIUM-ALBUTEROL 0.5-2.5 (3) MG/3ML IN SOLN
3.0000 mL | Freq: Once | RESPIRATORY_TRACT | Status: AC
  Administered 2017-12-08: 3 mL via RESPIRATORY_TRACT
  Filled 2017-12-08: qty 3

## 2017-12-08 NOTE — ED Notes (Signed)
Light green, lav and blue top sent to lab. Blood cultures x2 at bedside and sputum.

## 2017-12-08 NOTE — Discharge Instructions (Addendum)
Given your symptoms we suspect you are having a flare of COPD. Please take prednisone and antibiotic as prescribed. Continue using your home breathing treatments. You need close follow-up in the next 1-2 days to ensure your symptoms are improving. I have ran things by your oncologist Dr. Inda Merlin who agrees with ED treatment and discharge plan.  Return to the ED for worsening shortness of breath, chest pain, palpitations, lightheadedness, nausea, vomiting, fevers, chills.

## 2017-12-08 NOTE — ED Notes (Signed)
Patient given urinal.

## 2017-12-08 NOTE — ED Notes (Signed)
Patient brought back to Res A. Oxygen 66% on 3L. History of COPD/emphysema. Patient placed on 6L [place]. Respiratory called and aware. Patient 90% on 6L.

## 2017-12-08 NOTE — ED Provider Notes (Signed)
   Face-to-face evaluation   History: He is here for evaluation of cough, with sputum production, usually "opaque."  Today he was short of breath and his sats were in the 70s when he checked at home.  He is on chronic oxygen therapy at 3 L.  Physical exam: Currently (12:38 PM), O2 saturation normal on 2-1/2 L of nasal cannula oxygen.  Lungs with decreased air movement bilaterally, and generalized wheeze with rhonchi.  Medical screening examination/treatment/procedure(s) were conducted as a shared visit with non-physician practitioner(s) and myself.  I personally evaluated the patient during the encounter   Daleen Bo, MD 12/08/17 1730

## 2017-12-08 NOTE — ED Triage Notes (Signed)
Starting Friday night at 1700 patient was short of breath and it lasted throughout the weekend, patients family reported patients oxygen in the 70s at home today. Patients family called to speak with a nurse at a doctors office as patient was scheduled today for regular labs and pt was directed to the ED.  Upon arrival to ED patients oxygen in the 60s-70s on 3L. Awake and alert. Oriented x4. Hx of small cell lung cancer with mets to bones. Last chemo treatment 2 weeks ago. Pink tinged sputum coughed up in ED.

## 2017-12-08 NOTE — Telephone Encounter (Signed)
Wife called that pt has trouble breathing and low sats in the AM when he wakes up. Gets panicky.  From Friday at 5pm to this morning. He wakes up in the morning with low sats. 72% this morning. 73% the other morning. His o2 will come up into the mid 80s. He is on O2 continuous at 3 liters. He will cough out a lot of phlegm and then his sats will increase and he is less panicky. He does have a head cold and chest congestion. No fever. His phlegm was light green for the first time this morning.  He is using flonase that helps a little. He has been using his nebulizer albuterol this weekend as well.   Does he need a mask at night? Does he need to increase his o2 at night? He does not like the powder inhaler like spirava.  S/w Dr Julien Nordmann and had pt go to ER.   12/4 etoposide, 12/5 etoposide, blood, and onpro.

## 2017-12-08 NOTE — ED Provider Notes (Signed)
[facility] DEPT Provider Note   CSN: [id number] Arrival date & time: 12/08/17  2423     History   Chief Complaint Chief Complaint  Patient presents with  . Shortness of Breath    HPI Jose Mckinney is a 71 y.o. 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 Patient has been monitoring his oxygen saturations at home and states over the last 2-3 days his oxygen saturations have been around 70-75% while at rest, he is more short of breath with exertion. States that baseline he typically has exertional shortness of breath but it has become worse over the last 2-3 days. This is different from his baseline. He is on chronic oxygen therapy at 2-3 L of oxygen at all times. Associated symptoms include increased cough with increased sputum production. He denies fevers or chills, chest pain, abdominal pain, vomiting, changes in bowel movements or urinary symptoms. Has tried using albuterol nebulizing machine at home with minimal relief. He contacted oncology clinic who recommended ED evaluation. Last chemotherapy infusion 2 weeks ago. He is followed by Dr. Inda Merlin.  HPI  Past Medical History:  Diagnosis Date  . Anxiety   . CHF (congestive heart failure) (HCC)    x 3 episodes- [place]. pt. living here since 11-'1-16  . COPD with emphysema ([place])   . Depression   . Dyspnea   . HTN (hypertension)   . On home oxygen therapy   . Pulmonary HTN (HCC)    Dr. Marlyn Corporal is following.  . Recurrent left pleural effusion   . Sciatic leg pain    INTERMITTENT- occ. uses Hydrocodone as needed  . Seasonal allergies   . Small cell lung cancer, left ([place]) 09/15/2017    Patient Active Problem List   Diagnosis Date Noted  . Anemia due to antineoplastic chemotherapy 10/13/2017  . Need for prophylactic  vaccination and inoculation against influenza 10/13/2017  . Small cell lung cancer, left ([place]) 09/15/2017  . Goals of care, counseling/discussion 09/15/2017  . Encounter for antineoplastic chemotherapy 09/15/2017  . Protein calorie malnutrition ([place]) 09/15/2017  . Lung mass 09/11/2017  . Cor pulmonale, chronic ([place]) 07/15/2017  . 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 LBP (low back pain) 02/17/2015  . Pulmonary hypertension ([place]) 02/17/2015  . Chronic right-sided heart failure ([place]) 11/09/2014  . Chronic obstructive pulmonary disease ([place]) 11/09/2014  . 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 Bilateral pleural effusion 06/10/2014  . Essential (primary) hypertension 05/17/2014    Past Surgical History:  Procedure Laterality Date  . ANGIOPLASTY    . CARDIAC CATHETERIZATION     '15- [place]- no issues now.  . COLONOSCOPY WITH PROPOFOL N/A 02/09/2016   Procedure: COLONOSCOPY WITH PROPOFOL;  Surgeon: Carol Ada, MD;  Location: [facility];  Service: Endoscopy;  Laterality: N/A;  . IR THORACENTESIS ASP PLEURAL SPACE W/IMG GUIDE  08/08/2017  . MASTOIDECTOMY Left   . TONSILLECTOMY         Home Medications    Prior to Admission medications   Medication Sig Start Date End Date Taking? Authorizing Provider  albuterol (PROVENTIL) (2.5 MG/3ML) 0.083% nebulizer solution Take 3 mLs (2.5 mg total) by nebulization every 6 (six) hours as needed for wheezing or shortness of breath. 01/08/16  Yes Chesley Mires, MD  dronabinol (MARINOL) 2.5 MG capsule Take 1 capsule (2.5 mg total)  by mouth 2 (two) times daily before a meal. 10/07/17  Yes Curcio, Roselie Awkward, NP  loratadine (CLARITIN) 10 MG tablet Take 10 mg by mouth daily as needed for allergies.   Yes [provider]  sertraline (ZOLOFT) 50 MG tablet Take 50 mg by mouth 2 (two) times daily.   Yes [provider]  feeding supplement (BOOST HIGH PROTEIN) LIQD Take 1 Container by mouth daily.  09/14/15    [provider]  fluticasone (FLONASE) 50 MCG/ACT nasal spray Place 2 sprays into both nostrils daily as needed for allergies or rhinitis.    [provider]  ibuprofen (ADVIL,MOTRIN) 200 MG tablet Take 600 mg by mouth 3 (three) times daily as needed.    [provider]  ipratropium (ATROVENT) 0.02 % nebulizer solution Take 2.5 mLs (0.5 mg total) by nebulization 4 (four) times daily. Patient taking differently: Take 0.5 mg by nebulization every 6 (six) hours as needed for wheezing or shortness of breath.  01/08/16   Chesley Mires, MD  levofloxacin (LEVAQUIN) 500 MG tablet Take 1.5 tablets (750 mg total) by mouth daily for 8 days. 12/08/17 12/16/17  Kinnie Feil, PA-C  LORazepam (ATIVAN) 0.5 MG tablet Take 1 tablet (0.5 mg total) by mouth every 8 (eight) hours. Take 1 tab 1 hour prior to MRI. May repeat X1 at time of MRI. Patient taking differently: Take 0.5 mg by mouth every 8 (eight) hours as needed for anxiety. Take 1 tab 1 hour prior to MRI. May repeat X1 at time of MRI. 09/15/17   Maryanna Shape, NP  predniSONE (DELTASONE) 10 MG tablet Take 6 tablets (60 mg total) by mouth daily for 7 days. 12/08/17 12/15/17  Kinnie Feil, PA-C  prochlorperazine (COMPAZINE) 10 MG tablet Take 1 tablet (10 mg total) by mouth every 6 (six) hours as needed for nausea or vomiting. 09/15/17   Maryanna Shape, NP  sildenafil (REVATIO) 20 MG tablet Take 1 tablet (20 mg total) by mouth 3 (three) times daily. Patient taking differently: Take 20 mg by mouth 3 (three) times daily as needed (ed).  04/16/16   Chesley Mires, MD  tiotropium (SPIRIVA HANDIHALER) 18 MCG inhalation capsule Place 1 capsule (18 mcg total) into inhaler and inhale daily. Patient not taking: Reported on 12/08/2017 05/06/17   Chesley Mires, MD  torsemide (DEMADEX) 20 MG tablet Take 0.5 tablets (10 mg total) by mouth as needed (SWELLING, SOB). 10/03/17 01/01/18  Liliane Shi, PA-C    Family History Family History    Adopted: Yes  Family history unknown: Yes    Social History Social History   Tobacco Use  . Smoking status: Former Smoker    Packs/day: 2.50    Years: 50.00    Pack years: 125.00    Types: Cigarettes    Last attempt to quit: 07/10/2014    Years since quitting: 3.4  . Smokeless tobacco: Never Used  . Tobacco comment: Smoked 2.5-3 PPD at highest amount.   Substance Use Topics  . Alcohol use: Yes    Alcohol/week: 0.0 oz    Comment: 36 ozs of beer daily   . Drug use: No     Allergies   Patient has no known allergies.   Review of Systems Review of Systems  Respiratory: Positive for cough and shortness of breath.        Sputum production increased  Allergic/Immunologic: Positive for immunocompromised state.  All other systems reviewed and are negative.    Physical Exam Updated Vital  Signs BP 127/72   Pulse (!) 101   Temp (!) 97.4 F (36.3 C) (Oral)   Resp (!) 22   Ht 5\' 9"  (1.753 m)   Wt 52.6 kg (116 lb)   SpO2 94%   BMI 17.13 kg/m   Physical Exam  Constitutional: He is oriented to person, place, and time. He appears well-developed and well-nourished. No distress.  Chronically ill appearing, thin male.  HENT:  Head: Normocephalic and atraumatic.  Right Ear: External ear normal.  Left Ear: External ear normal.  Nose: Nose normal.  Mouth/Throat: Oropharynx is clear and moist.  Eyes: Conjunctivae and EOM are normal. No scleral icterus.  Neck: Normal range of motion. Neck supple.  Cardiovascular: Normal rate, regular rhythm, normal heart sounds and intact distal pulses.  No murmur heard. Tachycardic 100-105 No lower extremity edema or calf tenderness  Pulmonary/Chest: Effort normal. He has decreased breath sounds in the left lower field. He has wheezes in the left lower field. He has no rhonchi. He has no rales.  Borderline tachypneic. Oxygen greater than 95% on 3 L nasal cannula. Some belly breathing. No respiratory distress. No rales.   Abdominal: Soft.  There is no tenderness.  Musculoskeletal: Normal range of motion. He exhibits no deformity.  Neurological: He is alert and oriented to person, place, and time.  Skin: Skin is warm and dry. Capillary refill takes less than 2 seconds.  Psychiatric: He has a normal mood and affect. His behavior is normal. Judgment and thought content normal.  Nursing note and vitals reviewed.    ED Treatments / Results  Labs (all labs ordered are listed, but only abnormal results are displayed) Labs Reviewed  COMPREHENSIVE METABOLIC PANEL - Abnormal; Notable for the following components:      Result Value   Sodium 133 (*)    Chloride 93 (*)    Glucose, Bld 125 (*)    All other components within normal limits  CBC WITH DIFFERENTIAL/PLATELET - Abnormal; Notable for the following components:   RBC 2.29 (*)    Hemoglobin 7.3 (*)    HCT 22.5 (*)    RDW 19.6 (*)    All other components within normal limits  BRAIN NATRIURETIC PEPTIDE - Abnormal; Notable for the following components:   B Natriuretic Peptide 313.9 (*)    All other components within normal limits  BLOOD GAS, VENOUS - Abnormal; Notable for the following components:   pO2, Ven 57.8 (*)    Bicarbonate 31.7 (*)    Acid-Base Excess 5.8 (*)    All other components within normal limits  I-STAT CG4 LACTIC ACID, ED - Abnormal; Notable for the following components:   Lactic Acid, Venous 2.11 (*)    All other components within normal limits  CULTURE, BLOOD (ROUTINE X 2)  CULTURE, BLOOD (ROUTINE X 2)  URINALYSIS, ROUTINE W REFLEX MICROSCOPIC  I-STAT TROPONIN, ED  I-STAT VENOUS BLOOD GAS, ED  I-STAT CG4 LACTIC ACID, ED    EKG  EKG Interpretation  Date/Time:  Monday December 08 2017 12:08:27 EST Ventricular Rate:  96 PR Interval:    QRS Duration: 104 QT Interval:  358 QTC Calculation: 453 R Axis:   92 Text Interpretation:  Sinus or ectopic atrial tachycardia Atrial premature complexes Right axis deviation RSR' in V1 or V2, probably normal  variant Since last tracing rate faster Confirmed by Daleen Bo [phone]) on 12/08/2017 12:26:06 PM       Radiology Dg Chest 2 View  Result Date: 12/08/2017 CLINICAL DATA:  Pt  c/o sob x 3 days, hx copd, small cell lung cancer with mets to bones. Last chemo treatment 2 weeks ago. Pink tinged sputum coughed up in ED. EXAM: CHEST  2 VIEW COMPARISON:  Plain film of 08/08/2017.  CT 10/31/2017. FINDINGS: Patient rotated to the left on the frontal radiograph. Midline trachea. Moderate cardiomegaly. Atherosclerosis in the transverse aorta. Pleural-parenchymal opacification in the inferior left hemithorax is not significantly changed. No pneumothorax. Interstitial thickening is progressive. Probable scarring at the right lung base. IMPRESSION: Cardiomegaly with progressive interstitial thickening. Suspicious for mild pulmonary venous congestion, superimposed upon chronic bronchitis. Similar pleural-parenchymal opacification in the inferior left hemithorax. 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 Electronically Signed   By: Abigail Miyamoto M.D.   On: 12/08/2017 11:53    Procedures Procedures (including critical care time)  Medications Ordered in ED Medications  ipratropium-albuterol (DUONEB) 0.5-2.5 (3) MG/3ML nebulizer solution 3 mL (3 mLs Nebulization Given 12/08/17 1221)  predniSONE (DELTASONE) tablet 60 mg (60 mg Oral Given 12/08/17 1306)  levofloxacin (LEVAQUIN) tablet 750 mg (750 mg Oral Given 12/08/17 1306)     Initial Impression / Assessment and Plan / ED Course  I have reviewed the triage vital signs and the nursing notes.  Pertinent labs & imaging results that were available during my care of the patient were reviewed by me and considered in my medical decision making (see chart for details).  Clinical Course as of Dec 08 1314  Mon Dec 08, 2017  1209 Hemoglobin: (!) 7.3 [CG]  1209 Lactic Acid, Venous: (!!) 2.11 [CG]  1210 IMPRESSION: Cardiomegaly with  progressive interstitial thickening. Suspicious for mild pulmonary venous congestion, superimposed upon chronic bronchitis.  Similar pleural-parenchymal opacification in the inferior left hemithorax. 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   Electronically Signed By: Abigail Miyamoto M.D. On: 12/08/2017 11:53  [CG]  1301 B Natriuretic Peptide: (!) 313.9 [CG]    Clinical Course User Index [CG] Kinnie Feil, PA-C   71 year old male presents to ED for evaluation of shortness of breath and hypoxia. He has history of metastatic lung cancer and COPD. Associated symptoms include increased cough and sputum production.  On exam, he is nontoxic appearing. He has remained borderline tachycardic into. He is on chronic oxygen therapy with 2-3 L of oxygen via nasal cannula at home, he was placed on the same in the ED with adequate oxygen saturations. He had no exertional chest pain, ACS was considered but does not fit clinical picture. He has no known CAD, hypertension, diabetes. Pulmonary embolism also in differential however this also does not fit clinical picture and symptomatology. Given increased sputum production, cough and hypoxia in COPD patient suspect COPD exacerbation. Chest x-ray without infiltrate, mild interstitial thickening that may suggest pulmonary venous congestion. BNP is 300. No previous history of heart failure. Patient does not look fluid overloaded on exam.  Patient shared with supervising physician Dr. Eulis Foster and oncologist Dr. Inda Merlin. They are agreeable with prednisone, Levaquin and breathing treatments for treatment of COPD exacerbation with close outpatient follow-up. Discussed plan with patient and family were agreeable with ED treatment. Patient is eager to go home. Discussed return precautions. Final Clinical Impressions(s) / ED Diagnoses   Final diagnoses:  COPD exacerbation [facility])    ED Discharge Orders        Ordered     levofloxacin (LEVAQUIN) 500 MG tablet  Daily     12/08/17 1307    predniSONE (DELTASONE) 10 MG tablet  Daily  12/08/17 [place], [place], PA-C 12/08/17 1315    Daleen Bo, MD 12/08/17 Arville Lime    Daleen Bo, MD 12/08/17 [phone]

## 2017-12-08 NOTE — ED Notes (Signed)
Respiratory to bedside.

## 2017-12-09 ENCOUNTER — Encounter (HOSPITAL_COMMUNITY): Payer: Self-pay

## 2017-12-09 LAB — BLOOD GAS, VENOUS
ACID-BASE EXCESS: 5.8 mmol/L — AB (ref 0.0–2.0)
BICARBONATE: 31.7 mmol/L — AB (ref 20.0–28.0)
O2 Content: 3.5 L/min
O2 Saturation: 86.9 %
PCO2 VEN: 53.7 mmHg (ref 44.0–60.0)
PH VEN: 7.388 (ref 7.250–7.430)
Patient temperature: 98.6
pO2, Ven: 57.8 mmHg — ABNORMAL HIGH (ref 32.0–45.0)

## 2017-12-11 ENCOUNTER — Ambulatory Visit (HOSPITAL_COMMUNITY)
Admission: RE | Admit: 2017-12-11 | Discharge: 2017-12-11 | Disposition: A | Discharge status: Home / Self Care | Payer: Medicare Other | Source: Ambulatory Visit | Attending: Oncology | Admitting: Oncology

## 2017-12-11 ENCOUNTER — Encounter (HOSPITAL_COMMUNITY): Payer: Self-pay

## 2017-12-11 DIAGNOSIS — C7951 Secondary malignant neoplasm of bone: Secondary | ICD-10-CM | POA: Diagnosis not present

## 2017-12-11 DIAGNOSIS — E279 Disorder of adrenal gland, unspecified: Secondary | ICD-10-CM | POA: Diagnosis not present

## 2017-12-11 DIAGNOSIS — C3492 Malignant neoplasm of unspecified part of left bronchus or lung: Secondary | ICD-10-CM | POA: Insufficient documentation

## 2017-12-11 MED ORDER — IOPAMIDOL (ISOVUE-300) INJECTION 61%
INTRAVENOUS | Status: AC
  Administered 2017-12-11: 100 mL via INTRAVENOUS
  Filled 2017-12-11: qty 100

## 2017-12-11 MED ORDER — IOPAMIDOL (ISOVUE-300) INJECTION 61%
100.0000 mL | Freq: Once | INTRAVENOUS | Status: AC | PRN
  Administered 2017-12-11: 100 mL via INTRAVENOUS

## 2017-12-13 LAB — CULTURE, BLOOD (ROUTINE X 2)
Culture: NO GROWTH
Culture: NO GROWTH
SPECIAL REQUESTS: ADEQUATE
SPECIAL REQUESTS: ADEQUATE

## 2017-12-15 ENCOUNTER — Ambulatory Visit (HOSPITAL_BASED_OUTPATIENT_CLINIC_OR_DEPARTMENT_OTHER): Payer: Medicare Other | Admitting: Internal Medicine

## 2017-12-15 ENCOUNTER — Ambulatory Visit (HOSPITAL_BASED_OUTPATIENT_CLINIC_OR_DEPARTMENT_OTHER): Payer: Medicare Other

## 2017-12-15 ENCOUNTER — Other Ambulatory Visit (HOSPITAL_BASED_OUTPATIENT_CLINIC_OR_DEPARTMENT_OTHER): Payer: Medicare Other

## 2017-12-15 ENCOUNTER — Encounter: Payer: Self-pay | Admitting: Internal Medicine

## 2017-12-15 VITALS — BP 102/50 | HR 88 | Temp 98.1°F | Resp 19 | Ht 69.0 in | Wt 119.1 lb

## 2017-12-15 DIAGNOSIS — C7951 Secondary malignant neoplasm of bone: Secondary | ICD-10-CM | POA: Diagnosis not present

## 2017-12-15 DIAGNOSIS — T451X5A Adverse effect of antineoplastic and immunosuppressive drugs, initial encounter: Secondary | ICD-10-CM

## 2017-12-15 DIAGNOSIS — C3492 Malignant neoplasm of unspecified part of left bronchus or lung: Secondary | ICD-10-CM | POA: Diagnosis present

## 2017-12-15 DIAGNOSIS — J439 Emphysema, unspecified: Secondary | ICD-10-CM

## 2017-12-15 DIAGNOSIS — D6481 Anemia due to antineoplastic chemotherapy: Secondary | ICD-10-CM

## 2017-12-15 DIAGNOSIS — Z5111 Encounter for antineoplastic chemotherapy: Secondary | ICD-10-CM | POA: Diagnosis present

## 2017-12-15 DIAGNOSIS — C3490 Malignant neoplasm of unspecified part of unspecified bronchus or lung: Secondary | ICD-10-CM

## 2017-12-15 LAB — COMPREHENSIVE METABOLIC PANEL
ALT: 9 U/L (ref 0–55)
AST: 15 U/L (ref 5–34)
Albumin: 3.6 g/dL (ref 3.5–5.0)
Alkaline Phosphatase: 79 U/L (ref 40–150)
Anion Gap: 9 mEq/L (ref 3–11)
BUN: 9.4 mg/dL (ref 7.0–26.0)
CHLORIDE: 94 meq/L — AB (ref 98–109)
CO2: 35 mEq/L — ABNORMAL HIGH (ref 22–29)
Calcium: 9.7 mg/dL (ref 8.4–10.4)
Creatinine: 0.9 mg/dL (ref 0.7–1.3)
GLUCOSE: 89 mg/dL (ref 70–140)
POTASSIUM: 3.5 meq/L (ref 3.5–5.1)
SODIUM: 138 meq/L (ref 136–145)
Total Bilirubin: <0.22 mg/dL (ref 0.20–1.20)
Total Protein: 6.5 g/dL (ref 6.4–8.3)

## 2017-12-15 LAB — CBC WITH DIFFERENTIAL/PLATELET
BASO%: 0.2 % (ref 0.0–2.0)
Basophils Absolute: 0 10*3/uL (ref 0.0–0.1)
EOS ABS: 0 10*3/uL (ref 0.0–0.5)
EOS%: 0 % (ref 0.0–7.0)
HEMATOCRIT: 27.3 % — AB (ref 38.4–49.9)
HGB: 9 g/dL — ABNORMAL LOW (ref 13.0–17.1)
LYMPH#: 4.5 10*3/uL — AB (ref 0.9–3.3)
LYMPH%: 22.4 % (ref 14.0–49.0)
MCH: 32.3 pg (ref 27.2–33.4)
MCHC: 32.9 g/dL (ref 32.0–36.0)
MCV: 98.3 fL — AB (ref 79.3–98.0)
MONO#: 1.1 10*3/uL — AB (ref 0.1–0.9)
MONO%: 5.2 % (ref 0.0–14.0)
NEUT%: 72.2 % (ref 39.0–75.0)
NEUTROS ABS: 14.7 10*3/uL — AB (ref 1.5–6.5)
PLATELETS: 343 10*3/uL (ref 140–400)
RBC: 2.78 10*6/uL — AB (ref 4.20–5.82)
RDW: 23.9 % — ABNORMAL HIGH (ref 11.0–14.6)
WBC: 20.3 10*3/uL — AB (ref 4.0–10.3)

## 2017-12-15 MED ORDER — DEXAMETHASONE SODIUM PHOSPHATE 10 MG/ML IJ SOLN
10.0000 mg | Freq: Once | INTRAMUSCULAR | Status: AC
  Administered 2017-12-15: 10 mg via INTRAVENOUS

## 2017-12-15 MED ORDER — PALONOSETRON HCL INJECTION 0.25 MG/5ML
INTRAVENOUS | Status: AC
  Filled 2017-12-15: qty 5

## 2017-12-15 MED ORDER — SODIUM CHLORIDE 0.9 % IV SOLN
100.0000 mg/m2 | Freq: Once | INTRAVENOUS | Status: AC
  Administered 2017-12-15: 160 mg via INTRAVENOUS
  Filled 2017-12-15: qty 8

## 2017-12-15 MED ORDER — SODIUM CHLORIDE 0.9 % IV SOLN
376.0000 mg | Freq: Once | INTRAVENOUS | Status: AC
  Administered 2017-12-15: 380 mg via INTRAVENOUS
  Filled 2017-12-15: qty 38

## 2017-12-15 MED ORDER — SODIUM CHLORIDE 0.9 % IV SOLN
Freq: Once | INTRAVENOUS | Status: AC
  Administered 2017-12-15: 11:00:00 via INTRAVENOUS

## 2017-12-15 MED ORDER — PALONOSETRON HCL INJECTION 0.25 MG/5ML
0.2500 mg | Freq: Once | INTRAVENOUS | Status: AC
  Administered 2017-12-15: 0.25 mg via INTRAVENOUS

## 2017-12-15 MED ORDER — DEXAMETHASONE SODIUM PHOSPHATE 10 MG/ML IJ SOLN
INTRAMUSCULAR | Status: AC
  Filled 2017-12-15: qty 1

## 2017-12-15 NOTE — Progress Notes (Signed)
[place] Telephone:([phone]) [phone]   Fax:(336) [phone]  OFFICE PROGRESS NOTE  [name], PA-C [address] [place]  [address]  DIAGNOSIS: Extensive stage (T3, N3, M1c) small cell lung cancer presented with widespread malignancy including nodal stations, bones, left pleural space and lungs diagnosed in September 2018.  PRIOR THERAPY: None  CURRENT THERAPY: Systemic chemotherapy with carboplatin for AUC 5 on day 1 and etoposide 100 mg/M2 on days 1, 2 and 3 with Neulasta support. Status post 4 cycles.  INTERVAL HISTORY: Jose Mckinney 71 y.o. male returns to the clinic today for follow-up visit accompanied by his son and daughter.  His wife and son from [place] were also on the phone.  The patient is feeling fine today except for the baseline shortness of breath.  He was seen at the emergency department last week for shortness of breath and his oxygen saturation was down to 70s.  He was treated for COPD exacerbation with the steroid and he is feeling much better.  He is currently on home oxygen 3 L/min with oxygen saturation of 91%.  He denied having any chest pain but has cough with no hemoptysis.  He denied having any recent weight loss or night sweats.  He has no nausea, vomiting, diarrhea or constipation.  He has no fever or chills.  He had a repeat CT scan of the chest, abdomen and pelvis performed recently and he is here for evaluation and discussion of his discuss results.  MEDICAL HISTORY: Past Medical History:  Diagnosis Date  . Anxiety   . CHF (congestive heart failure) (HCC)    x 3 episodes- [place]. pt. living here since 11-'1-16  . 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 Depression   . Dyspnea   . HTN (hypertension)   . On home oxygen therapy   . Pulmonary HTN (HCC)    Dr. Marlyn Corporal is following.  . Recurrent left pleural effusion   . Sciatic leg pain    INTERMITTENT- occ. uses Hydrocodone as needed  . Seasonal allergies   .  Small cell lung cancer, left ([facility]) 09/15/2017    ALLERGIES:  has No Known Allergies.  MEDICATIONS:  Current Outpatient Medications  Medication Sig Dispense Refill  . albuterol (PROVENTIL) (2.5 MG/3ML) 0.083% nebulizer solution Take 3 mLs (2.5 mg total) by nebulization every 6 (six) hours as needed for wheezing or shortness of breath. 75 mL 12  . dronabinol (MARINOL) 2.5 MG capsule Take 1 capsule (2.5 mg total) by mouth 2 (two) times daily before a meal. 60 capsule 0  . feeding supplement (BOOST HIGH PROTEIN) LIQD Take 1 Container by mouth daily.     . fluticasone (FLONASE) 50 MCG/ACT nasal spray Place 2 sprays into both nostrils daily as needed for allergies or rhinitis.    Marland Kitchen ibuprofen (ADVIL,MOTRIN) 200 MG tablet Take 600 mg by mouth 3 (three) times daily as needed.    Marland Kitchen ipratropium (ATROVENT) 0.02 % nebulizer solution Take 2.5 mLs (0.5 mg total) by nebulization 4 (four) times daily. (Patient taking differently: Take 0.5 mg by nebulization every 6 (six) hours as needed for wheezing or shortness of breath. ) 75 mL 12  . levofloxacin (LEVAQUIN) 500 MG tablet Take 1.5 tablets (750 mg total) by mouth daily for 8 days. 12 tablet 0  . loratadine (CLARITIN) 10 MG tablet Take 10 mg by mouth daily as needed for allergies.    Marland Kitchen LORazepam (ATIVAN) 0.5 MG tablet Take 1  tablet (0.5 mg total) by mouth every 8 (eight) hours. Take 1 tab 1 hour prior to MRI. May repeat X1 at time of MRI. (Patient taking differently: Take 0.5 mg by mouth every 8 (eight) hours as needed for anxiety. Take 1 tab 1 hour prior to MRI. May repeat X1 at time of MRI.) 2 tablet 0  . predniSONE (DELTASONE) 10 MG tablet Take 6 tablets (60 mg total) by mouth daily for 7 days. 42 tablet 0  . prochlorperazine (COMPAZINE) 10 MG tablet Take 1 tablet (10 mg total) by mouth every 6 (six) hours as needed for nausea or vomiting. 30 tablet 1  . sertraline (ZOLOFT) 50 MG tablet Take 50 mg by mouth 2 (two) times daily.    . sildenafil (REVATIO) 20 MG  tablet Take 1 tablet (20 mg total) by mouth 3 (three) times daily. (Patient taking differently: Take 20 mg by mouth 3 (three) times daily as needed (ed). ) 90 tablet 6  . tiotropium (SPIRIVA HANDIHALER) 18 MCG inhalation capsule Place 1 capsule (18 mcg total) into inhaler and inhale daily. 30 capsule 12  . torsemide (DEMADEX) 20 MG tablet Take 0.5 tablets (10 mg total) by mouth as needed (SWELLING, SOB).     No current facility-administered medications for this visit.     SURGICAL HISTORY:  Past Surgical History:  Procedure Laterality Date  . ANGIOPLASTY    . CARDIAC CATHETERIZATION     '15- [place]- no issues now.  . COLONOSCOPY WITH PROPOFOL N/A 02/09/2016   Procedure: COLONOSCOPY WITH PROPOFOL;  Surgeon: Carol Ada, MD;  Location: [facility];  Service: Endoscopy;  Laterality: N/A;  . IR THORACENTESIS ASP PLEURAL SPACE W/IMG GUIDE  08/08/2017  . MASTOIDECTOMY Left   . TONSILLECTOMY      REVIEW OF SYSTEMS:  Constitutional: positive for fatigue Eyes: negative Ears, nose, mouth, throat, and face: negative Respiratory: positive for cough and dyspnea on exertion Cardiovascular: negative Gastrointestinal: negative Genitourinary:negative Integument/breast: negative Hematologic/lymphatic: negative Musculoskeletal:positive for muscle weakness Neurological: negative Behavioral/Psych: negative Endocrine: negative Allergic/Immunologic: negative   PHYSICAL EXAMINATION: General appearance: alert, cooperative, fatigued and no distress Head: Normocephalic, without obvious abnormality, atraumatic Neck: no adenopathy, no JVD, supple, symmetrical, trachea midline and thyroid not enlarged, symmetric, no tenderness/mass/nodules Lymph nodes: Cervical, supraclavicular, and axillary nodes normal. Resp: wheezes bilaterally Back: symmetric, no curvature. ROM normal. No CVA tenderness. Cardio: regular rate and rhythm, S1, S2 normal, no murmur, click, rub or gallop GI: soft, non-tender;  bowel sounds normal; no masses,  no organomegaly Extremities: extremities normal, atraumatic, no cyanosis or edema Neurologic: Alert and oriented X 3, normal strength and tone. Normal symmetric reflexes. Normal coordination and gait  ECOG PERFORMANCE STATUS: 1 - Symptomatic but completely ambulatory  Blood pressure (!) 102/50, pulse 88, temperature 98.1 F (36.7 C), temperature source Oral, resp. rate 19, height 5\' 9"  (1.753 m), weight 119 lb 1.6 oz (54 kg), SpO2 91 %.  LABORATORY DATA: Lab Results  Component Value Date   WBC 20.3 (H) 12/15/2017   HGB 9.0 (L) 12/15/2017   HCT 27.3 (L) 12/15/2017   MCV 98.3 (H) 12/15/2017   PLT 343 12/15/2017      Chemistry      Component Value Date/Time   NA 138 12/15/2017 0907   K 3.5 12/15/2017 0907   CL 93 (L) 12/08/2017 1027   CO2 35 (H) 12/15/2017 0907   BUN 9.4 12/15/2017 0907   CREATININE 0.9 12/15/2017 0907      Component Value Date/Time   CALCIUM 9.7  12/15/2017 0907   ALKPHOS 79 12/15/2017 0907   AST 15 12/15/2017 0907   ALT 9 12/15/2017 0907   BILITOT <0.22 12/15/2017 9326       RADIOGRAPHIC STUDIES: Dg Chest 2 View  Result Date: 12/08/2017 CLINICAL DATA:  Pt c/o sob x 3 days, hx copd, small cell lung cancer with mets to bones. Last chemo treatment 2 weeks ago. Pink tinged sputum coughed up in ED. EXAM: CHEST  2 VIEW COMPARISON:  Plain film of 08/08/2017.  CT 10/31/2017. FINDINGS: Patient rotated to the left on the frontal radiograph. Midline trachea. Moderate cardiomegaly. Atherosclerosis in the transverse aorta. Pleural-parenchymal opacification in the inferior left hemithorax is not significantly changed. No pneumothorax. Interstitial thickening is progressive. Probable scarring at the right lung base. IMPRESSION: Cardiomegaly with progressive interstitial thickening. Suspicious for mild pulmonary venous congestion, superimposed upon chronic bronchitis. Similar pleural-parenchymal opacification in the inferior left hemithorax.  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 Electronically Signed   By: Abigail Miyamoto M.D.   On: 12/08/2017 11:53   Ct Chest W Contrast  Result Date: 12/11/2017 CLINICAL DATA:  Small cell lung cancer diagnosed 09/15/2017. Chemotherapy in progress. Patient reports cough and chronic shortness of breath with low abdominal pain for 1 year. EXAM: CT CHEST, ABDOMEN, AND PELVIS WITH CONTRAST TECHNIQUE: Multidetector CT imaging of the chest, abdomen and pelvis was performed following the standard protocol during bolus administration of intravenous contrast. CONTRAST:  100 ml Isovue-300. COMPARISON:  Chest radiographs 12/08/2017, CTs of the chest, abdomen and pelvis 10/31/2017 and PET-CT 08/27/2017. FINDINGS: CT CHEST FINDINGS Cardiovascular: Atherosclerosis of the aorta, great vessels and coronary arteries. There is central enlargement of the pulmonary arteries consistent with pulmonary arterial hypertension. No acute vascular findings are seen. There is stable mild cardiomegaly. No significant pericardial fluid. Mediastinum/Nodes: There are no enlarged mediastinal, hilar or axillary lymph nodes. No definite residual enlarged left supraclavicular node. Small mediastinal lymph nodes are present. The trachea, thyroid gland and esophagus demonstrate no significant findings. Lungs/Pleura: Chronic bilateral calcified fibrothoraces again noted, left greater than right. There is increased consolidation medially in the left lower lobe. Other areas of chronic atelectasis are present in the lingula and left lobe. The subpleural enhancing nodularity previously noted posteriorly in the LEFT lower lobe is less evident (axial image 38 of series 2). There is a stable 8 mm right lower lobe nodule on image 118. There is a stable sub solid lesion peripherally in the right upper lobe (images 52 through 54. Underlying emphysema and central airway thickening noted. Musculoskeletal/Chest wall: Multiple sclerotic  osseous metastases are again noted within the spine, ribs, sternum, bilateral scapula and left humeral head. No lytic lesion or pathologic fracture seen. CT ABDOMEN AND PELVIS FINDINGS Hepatobiliary: The liver appears stable without suspicious findings. There is a small cyst adjacent to the gallbladder. No evidence of gallstones, gallbladder wall thickening or biliary dilatation. Pancreas: Stable small fluid attenuation lesion in the head of the pancreas, measuring 8 mm on image 72. No evidence pancreatic ductal dilatation, enhancing mass or surrounding inflammation. Spleen: Normal in size without focal abnormality. Adrenals/Urinary Tract: Similar appearance of left adrenal nodule measuring 2.3 x 1.4 cm on image 60. This has a nonspecific density of 58 HU. This measured 3 HU on prior PET-CT (image 108). There was subjacent hypermetabolic activity which appeared to be due to lymph nodes on CT. This is probably an incidental adenoma. The right adrenal gland appears normal. Renovascular calcifications bilaterally as well as possible left renal  calculi. No evidence of hydronephrosis, ureteral calculus or enhancing renal mass. The bladder appears unremarkable. Stomach/Bowel: No evidence of bowel wall thickening, distention or surrounding inflammatory change. Mild sigmoid diverticulosis. Vascular/Lymphatic: No residual enlarged abdominal or pelvic lymph nodes. There is diffuse aortic and branch vessel atherosclerosis. No evidence of large vessel occlusion or significant venous abnormality. Reproductive: The prostate gland and seminal vesicles appear unremarkable. Other: Limited subcutaneous and intra- abdominal fat.  No ascites. Musculoskeletal: Multifocal blastic metastases within the spine and pelvis are grossly stable. No lytic lesion or pathologic fracture identified. IMPRESSION: 1. No residual adenopathy demonstrated. 2. Sclerotic osseous metastases are grossly stable. No lytic lesion or pathologic fracture. 3.  Increased consolidation in the left lower lobe medially. No discrete residual mass lesion identified. 4. 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 5. Stable left adrenal nodule, likely an adenoma based on prior PET-CT. Electronically Signed   By: Richardean Sale M.D.   On: 12/11/2017 15:49   Ct Abdomen Pelvis W Contrast  Result Date: 12/11/2017 CLINICAL DATA:  Small cell lung cancer diagnosed 09/15/2017. Chemotherapy in progress. Patient reports cough and chronic shortness of breath with low abdominal pain for 1 year. EXAM: CT CHEST, ABDOMEN, AND PELVIS WITH CONTRAST TECHNIQUE: Multidetector CT imaging of the chest, abdomen and pelvis was performed following the standard protocol during bolus administration of intravenous contrast. CONTRAST:  100 ml Isovue-300. COMPARISON:  Chest radiographs 12/08/2017, CTs of the chest, abdomen and pelvis 10/31/2017 and PET-CT 08/27/2017. FINDINGS: CT CHEST FINDINGS Cardiovascular: Atherosclerosis of the aorta, great vessels and coronary arteries. There is central enlargement of the pulmonary arteries consistent with pulmonary arterial hypertension. No acute vascular findings are seen. There is stable mild cardiomegaly. No significant pericardial fluid. Mediastinum/Nodes: There are no enlarged mediastinal, hilar or axillary lymph nodes. No definite residual enlarged left supraclavicular node. Small mediastinal lymph nodes are present. The trachea, thyroid gland and esophagus demonstrate no significant findings. Lungs/Pleura: Chronic bilateral calcified fibrothoraces again noted, left greater than right. There is increased consolidation medially in the left lower lobe. Other areas of chronic atelectasis are present in the lingula and left lobe. The subpleural enhancing nodularity previously noted posteriorly in the LEFT lower lobe is less evident (axial image 38 of series 2). There is a stable 8 mm right lower lobe nodule on image 118. There is a stable  sub solid lesion peripherally in the right upper lobe (images 52 through 54. Underlying emphysema and central airway thickening noted. Musculoskeletal/Chest wall: Multiple sclerotic osseous metastases are again noted within the spine, ribs, sternum, bilateral scapula and left humeral head. No lytic lesion or pathologic fracture seen. CT ABDOMEN AND PELVIS FINDINGS Hepatobiliary: The liver appears stable without suspicious findings. There is a small cyst adjacent to the gallbladder. No evidence of gallstones, gallbladder wall thickening or biliary dilatation. Pancreas: Stable small fluid attenuation lesion in the head of the pancreas, measuring 8 mm on image 72. No evidence pancreatic ductal dilatation, enhancing mass or surrounding inflammation. Spleen: Normal in size without focal abnormality. Adrenals/Urinary Tract: Similar appearance of left adrenal nodule measuring 2.3 x 1.4 cm on image 60. This has a nonspecific density of 58 HU. This measured 3 HU on prior PET-CT (image 108). There was subjacent hypermetabolic activity which appeared to be due to lymph nodes on CT. This is probably an incidental adenoma. The right adrenal gland appears normal. Renovascular calcifications bilaterally as well as possible left renal calculi. No evidence of hydronephrosis, ureteral calculus or enhancing renal mass. The bladder appears  unremarkable. Stomach/Bowel: No evidence of bowel wall thickening, distention or surrounding inflammatory change. Mild sigmoid diverticulosis. Vascular/Lymphatic: No residual enlarged abdominal or pelvic lymph nodes. There is diffuse aortic and branch vessel atherosclerosis. No evidence of large vessel occlusion or significant venous abnormality. Reproductive: The prostate gland and seminal vesicles appear unremarkable. Other: Limited subcutaneous and intra- abdominal fat.  No ascites. Musculoskeletal: Multifocal blastic metastases within the spine and pelvis are grossly stable. No lytic lesion or  pathologic fracture identified. IMPRESSION: 1. No residual adenopathy demonstrated. 2. Sclerotic osseous metastases are grossly stable. No lytic lesion or pathologic fracture. 3. Increased consolidation in the left lower lobe medially. No discrete residual mass lesion identified. 4. 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 5. Stable left adrenal nodule, likely an adenoma based on prior PET-CT. Electronically Signed   By: Richardean Sale M.D.   On: 12/11/2017 15:49    ASSESSMENT AND PLAN: This is a very pleasant 71 years old white male recently diagnosed with extensive stage small cell lung cancer and currently undergoing systemic chemotherapy with carboplatin and etoposide status post 4 cycles. The patient continues to tolerate this treatment fairly well with no significant adverse effects. Repeat CT scan of the chest, abdomen and pelvis showed no residual lymphadenopathy and stable disease otherwise. I personally and independently reviewed the scan images and discussed the results with the patient and his family. I recommended for the patient to continue his current treatment with carboplatin and etoposide and he would proceed with cycle #5 today. He will come back for follow-up visit in 3 weeks for evaluation before starting cycle #6. The patient was advised to call immediately if he has any concerning symptoms in the interval. The patient voices understanding of current disease status and treatment options and is in agreement with the current care plan. All questions were answered. The patient knows to call the clinic with any problems, questions or concerns. We can certainly see the patient much sooner if necessary. I spent 15 minutes counseling the patient face to face. The total time spent in the appointment was 25 minutes.  Disclaimer: This note was dictated with voice recognition software. Similar sounding words can inadvertently be transcribed and may not be corrected  upon review.

## 2017-12-15 NOTE — Patient Instructions (Signed)
[facility] Discharge Instructions for Patients Receiving Chemotherapy  Today you received the following chemotherapy agents carboplatin /vp-16 To help prevent nausea and vomiting after your treatment, we encourage you to take your nausea medication as prescribed.  If you develop nausea and vomiting that is not controlled by your nausea medication, call the clinic.   BELOW ARE SYMPTOMS THAT SHOULD BE REPORTED IMMEDIATELY:  *FEVER GREATER THAN 100.5 F  *CHILLS WITH OR WITHOUT FEVER  NAUSEA AND VOMITING THAT IS NOT CONTROLLED WITH YOUR NAUSEA MEDICATION  *UNUSUAL SHORTNESS OF BREATH  *UNUSUAL BRUISING OR BLEEDING  TENDERNESS IN MOUTH AND THROAT WITH OR WITHOUT PRESENCE OF ULCERS  *URINARY PROBLEMS  *BOWEL PROBLEMS  UNUSUAL RASH Items with * indicate a potential emergency and should be followed up as soon as possible.  Feel free to call the clinic should you have any questions or concerns. The clinic phone number is [phone].  Please show the [facility] at check-in to the Emergency Department and triage nurse.

## 2017-12-17 ENCOUNTER — Telehealth: Payer: Self-pay

## 2017-12-17 ENCOUNTER — Ambulatory Visit (HOSPITAL_BASED_OUTPATIENT_CLINIC_OR_DEPARTMENT_OTHER): Payer: Medicare Other

## 2017-12-17 ENCOUNTER — Telehealth: Payer: Self-pay | Admitting: Internal Medicine

## 2017-12-17 VITALS — BP 110/68 | HR 86 | Temp 98.2°F | Resp 18

## 2017-12-17 DIAGNOSIS — C3492 Malignant neoplasm of unspecified part of left bronchus or lung: Secondary | ICD-10-CM

## 2017-12-17 DIAGNOSIS — Z5111 Encounter for antineoplastic chemotherapy: Secondary | ICD-10-CM

## 2017-12-17 MED ORDER — ETOPOSIDE CHEMO INJECTION 1 GM/50ML
100.0000 mg/m2 | Freq: Once | INTRAVENOUS | Status: AC
  Administered 2017-12-17: 160 mg via INTRAVENOUS
  Filled 2017-12-17: qty 8

## 2017-12-17 MED ORDER — SODIUM CHLORIDE 0.9 % IV SOLN
Freq: Once | INTRAVENOUS | Status: AC
  Administered 2017-12-17: 10:00:00 via INTRAVENOUS

## 2017-12-17 MED ORDER — DEXAMETHASONE SODIUM PHOSPHATE 10 MG/ML IJ SOLN
10.0000 mg | Freq: Once | INTRAMUSCULAR | Status: AC
  Administered 2017-12-17: 10 mg via INTRAVENOUS

## 2017-12-17 MED ORDER — DEXAMETHASONE SODIUM PHOSPHATE 10 MG/ML IJ SOLN
INTRAMUSCULAR | Status: AC
  Filled 2017-12-17: qty 1

## 2017-12-17 NOTE — Telephone Encounter (Signed)
No additional appts to add per 12/24 los - appts already scheduled per los.

## 2017-12-17 NOTE — Telephone Encounter (Signed)
Patient's wife called to report the patient started chemo in October and yesterday the nurse pointed out that his ankles are swollen and recommended he see Dr. Burt Knack. She states he used to be on Torsemide a couple years ago, but stopped due to low blood pressure. She reports SOB on exertion, but it isn't necessarily "new" or "worse." She states he went to the ED recently and was diagnosed with bronchitis and he has COPD and lung cancer, so it's hard to tell if SOB is from lung or heart. She reports he is taking his medications as directed. Informed her that the patient shouldn't just restart the Torsemide without evaluation since he stopped because of problems with it. Scheduled him for OV with Dr. Burt Knack this Friday. She was grateful for call and agrees with treatment plan.

## 2017-12-17 NOTE — Patient Instructions (Signed)
[facility] Discharge Instructions for Patients Receiving Chemotherapy  Today you received the following chemotherapy agent: Etoposide   To help prevent nausea and vomiting after your treatment, we encourage you to take your nausea medication as directed  If you develop nausea and vomiting that is not controlled by your nausea medication, call the clinic.   BELOW ARE SYMPTOMS THAT SHOULD BE REPORTED IMMEDIATELY:  *FEVER GREATER THAN 100.5 F  *CHILLS WITH OR WITHOUT FEVER  NAUSEA AND VOMITING THAT IS NOT CONTROLLED WITH YOUR NAUSEA MEDICATION  *UNUSUAL SHORTNESS OF BREATH  *UNUSUAL BRUISING OR BLEEDING  TENDERNESS IN MOUTH AND THROAT WITH OR WITHOUT PRESENCE OF ULCERS  *URINARY PROBLEMS  *BOWEL PROBLEMS  UNUSUAL RASH Items with * indicate a potential emergency and should be followed up as soon as possible.  Feel free to call the clinic you have any questions or concerns. The clinic phone number is [phone].

## 2017-12-18 ENCOUNTER — Encounter (HOSPITAL_COMMUNITY): Payer: Self-pay

## 2017-12-18 ENCOUNTER — Ambulatory Visit (HOSPITAL_BASED_OUTPATIENT_CLINIC_OR_DEPARTMENT_OTHER): Payer: Medicare Other

## 2017-12-18 VITALS — BP 89/58 | HR 91 | Temp 98.0°F | Resp 24

## 2017-12-18 DIAGNOSIS — C3492 Malignant neoplasm of unspecified part of left bronchus or lung: Secondary | ICD-10-CM

## 2017-12-18 DIAGNOSIS — Z5189 Encounter for other specified aftercare: Secondary | ICD-10-CM

## 2017-12-18 DIAGNOSIS — C7951 Secondary malignant neoplasm of bone: Secondary | ICD-10-CM

## 2017-12-18 DIAGNOSIS — Z5111 Encounter for antineoplastic chemotherapy: Secondary | ICD-10-CM | POA: Diagnosis present

## 2017-12-18 MED ORDER — PEGFILGRASTIM 6 MG/0.6ML ~~LOC~~ PSKT
6.0000 mg | PREFILLED_SYRINGE | Freq: Once | SUBCUTANEOUS | Status: AC
  Administered 2017-12-18: 6 mg via SUBCUTANEOUS

## 2017-12-18 MED ORDER — DEXAMETHASONE SODIUM PHOSPHATE 10 MG/ML IJ SOLN
INTRAMUSCULAR | Status: AC
  Filled 2017-12-18: qty 1

## 2017-12-18 MED ORDER — SODIUM CHLORIDE 0.9 % IV SOLN
100.0000 mg/m2 | Freq: Once | INTRAVENOUS | Status: AC
  Administered 2017-12-18: 160 mg via INTRAVENOUS
  Filled 2017-12-18: qty 8

## 2017-12-18 MED ORDER — DEXAMETHASONE SODIUM PHOSPHATE 10 MG/ML IJ SOLN
10.0000 mg | Freq: Once | INTRAMUSCULAR | Status: AC
  Administered 2017-12-18: 10 mg via INTRAVENOUS

## 2017-12-18 MED ORDER — SODIUM CHLORIDE 0.9 % IV SOLN
Freq: Once | INTRAVENOUS | Status: AC
  Administered 2017-12-18: 10:00:00 via INTRAVENOUS

## 2017-12-18 MED ORDER — PEGFILGRASTIM 6 MG/0.6ML ~~LOC~~ PSKT
PREFILLED_SYRINGE | SUBCUTANEOUS | Status: AC
  Filled 2017-12-18: qty 0.6

## 2017-12-18 NOTE — Patient Instructions (Signed)
[facility] Discharge Instructions for Patients Receiving Chemotherapy  Today you received the following chemotherapy agents Vepesid.  To help prevent nausea and vomiting after your treatment, we encourage you to take your nausea medication as directed.   If you develop nausea and vomiting that is not controlled by your nausea medication, call the clinic.   BELOW ARE SYMPTOMS THAT SHOULD BE REPORTED IMMEDIATELY:  *FEVER GREATER THAN 100.5 F  *CHILLS WITH OR WITHOUT FEVER  NAUSEA AND VOMITING THAT IS NOT CONTROLLED WITH YOUR NAUSEA MEDICATION  *UNUSUAL SHORTNESS OF BREATH  *UNUSUAL BRUISING OR BLEEDING  TENDERNESS IN MOUTH AND THROAT WITH OR WITHOUT PRESENCE OF ULCERS  *URINARY PROBLEMS  *BOWEL PROBLEMS  UNUSUAL RASH Items with * indicate a potential emergency and should be followed up as soon as possible.  Feel free to call the clinic should you have any questions or concerns. The clinic phone number is [phone].  Please show the [facility] at check-in to the Emergency Department and triage nurse.

## 2017-12-19 ENCOUNTER — Encounter: Payer: Self-pay | Admitting: Cardiovascular Disease

## 2017-12-19 ENCOUNTER — Telehealth: Payer: Self-pay | Admitting: Pulmonary Disease

## 2017-12-19 ENCOUNTER — Ambulatory Visit (INDEPENDENT_AMBULATORY_CARE_PROVIDER_SITE_OTHER): Payer: Medicare Other | Admitting: Cardiovascular Disease

## 2017-12-19 VITALS — BP 120/60 | HR 80 | Ht 67.75 in | Wt 125.0 lb

## 2017-12-19 DIAGNOSIS — R609 Edema, unspecified: Secondary | ICD-10-CM | POA: Diagnosis not present

## 2017-12-19 MED ORDER — TORSEMIDE 20 MG PO TABS: 20.0000 mg | ORAL_TABLET | Freq: Every day | ORAL | 6 refills | Status: DC | PRN

## 2017-12-19 NOTE — Patient Instructions (Signed)
Medication Instructions:  1) START TORSEMIDE 20 mg daily as needed for swelling  Labwork: None  Testing/Procedures: None  Follow-Up: Your provider wants you to follow-up in: 6 months with Dr. Burt Knack. You will receive a reminder letter in the mail two months in advance. If you don't receive a letter, please call our office to schedule the follow-up appointment.    Any Other Special Instructions Will Be Listed Below (If Applicable).     If you need a refill on your cardiac medications before your next appointment, please call your pharmacy.

## 2017-12-19 NOTE — Telephone Encounter (Signed)
lmtcb X1 for spouse Jose Mckinney

## 2017-12-19 NOTE — Telephone Encounter (Signed)
Pt's spouse called back, stating that pt is not tolerating spiriva- states that he is not able to tolerate the powder medication, makes him cough and worsens his SOB.  Pt and wife are requesting a mist inhaler.    Pt states he does not recall ever taking another maintenance inhaler in the past.  VS please advise on recommendations.  Thanks!

## 2017-12-19 NOTE — Telephone Encounter (Signed)
Spoke with pt's spouse, aware of recs.  Nothing further needed.

## 2017-12-19 NOTE — Progress Notes (Signed)
Cardiology Office Note Date:  12/19/2017   ID:  Jose Mckinney, DOB 21-Mar-1946, MRN [id number]  PCP:  [name], PA-C  Cardiologist:  Sherren Mocha, MD    Chief Complaint  Patient presents with  . Edema  . Shortness of Breath     History of Present Illness: Jose Mckinney is a 71 y.o. male who presents for cardiac follow-up evaluation.  The patient has a history of right-sided heart failure, pulmonary hypertension, COPD, and former tobacco abuse.  He is maintained on continuous home O2.  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  He is increasingly weak and short of breath.  However, he feels like he is responding well to chemotherapy and is tolerating it well without nausea or vomiting.  He is short of breath with low level activity.  He is on 3 L of home oxygen.  Has occasional chest pain.  He is also noticed more swelling in his lower legs that has been present for at least the last few weeks.  Denies orthopnea or PND.  Denies lightheadedness or syncope.  Past Medical History:  Diagnosis Date  . Anxiety   . CHF (congestive heart failure) (HCC)    x 3 episodes- [place]. pt. living here since 11-'1-16  . COPD with emphysema ([place])   . Depression   . Dyspnea   . HTN (hypertension)   . On home oxygen therapy   . Pulmonary HTN (HCC)    Dr. Marlyn Corporal is following.  . Recurrent left pleural effusion   . Sciatic leg pain    INTERMITTENT- occ. uses Hydrocodone as needed  . Seasonal allergies   . Small cell lung cancer, left ([place]) 09/15/2017    Past Surgical History:  Procedure Laterality Date  . ANGIOPLASTY    . CARDIAC CATHETERIZATION     '15- [place]- no issues now.  . COLONOSCOPY WITH PROPOFOL N/A 02/09/2016   Procedure: COLONOSCOPY WITH PROPOFOL;  Surgeon: Carol Ada, MD;  Location: [facility];  Service:  Endoscopy;  Laterality: N/A;  . IR THORACENTESIS ASP PLEURAL SPACE W/IMG GUIDE  08/08/2017  . MASTOIDECTOMY Left   . TONSILLECTOMY      Current Outpatient Medications  Medication Sig Dispense Refill  . albuterol (PROVENTIL) (2.5 MG/3ML) 0.083% nebulizer solution Take 3 mLs (2.5 mg total) by nebulization every 6 (six) hours as needed for wheezing or shortness of breath. 75 mL 12  . dronabinol (MARINOL) 2.5 MG capsule Take 1 capsule (2.5 mg total) by mouth 2 (two) times daily before a meal. 60 capsule 0  . feeding supplement (BOOST HIGH PROTEIN) LIQD Take 1 Container by mouth daily.     . fluticasone (FLONASE) 50 MCG/ACT nasal spray Place 2 sprays into both nostrils daily as needed for allergies or rhinitis.    Marland Kitchen ibuprofen (ADVIL,MOTRIN) 200 MG tablet Take 600 mg by mouth 3 (three) times daily as needed (pain).     Marland Kitchen ipratropium (ATROVENT) 0.02 % nebulizer solution Take 0.5 mg by nebulization every 4 (four) hours as needed for wheezing or shortness of breath.    . loratadine (CLARITIN) 10 MG tablet Take 10 mg by mouth daily as needed for allergies.    Marland Kitchen prochlorperazine (COMPAZINE) 10 MG tablet Take 1 tablet (10 mg total) by mouth every 6 (six) hours as needed for nausea or vomiting. 30 tablet 1  .  sertraline (ZOLOFT) 50 MG tablet Take 50 mg by mouth 2 (two) times daily.    . sildenafil (REVATIO) 20 MG tablet Take 20 mg by mouth as directed.     No current facility-administered medications for this visit.     Allergies:   Patient has no known allergies.   Social History:  The patient  reports that he quit smoking about 3 years ago. His smoking use included cigarettes. He has a 125.00 pack-year smoking history. he has never used smokeless tobacco. He reports that he drinks alcohol. He reports that he does not use drugs.   Family History:  The patient's He was adopted. Family history is unknown by patient.    ROS:  Please see the history of present illness.  Otherwise, review of systems is  positive for leg swelling.  All other systems are reviewed and negative.    PHYSICAL EXAM: VS:  BP 120/60   Pulse 80   Ht 5' 7.75" (1.721 m)   Wt 125 lb (56.7 kg)   BMI 19.15 kg/m  , BMI Body mass index is 19.15 kg/m. GEN: pleasant cachectic male on O2 per [facility] in no acute distress  HEENT: normal  Neck: no JVD, no masses. No carotid bruits Cardiac: RRR with prominent S2             Respiratory:  clear to auscultation bilaterally, normal work of breathing GI: soft, nontender, nondistended, + BS MS: no deformity or atrophy  Ext: no pretibial edema Skin: warm and dry, no rash Neuro:  Strength and sensation are intact Psych: euthymic mood, full affect  EKG:  EKG is not ordered today.  Recent Labs: 12/08/2017: B Natriuretic Peptide 313.9 12/15/2017: ALT 9; BUN 9.4; Creatinine 0.9; HGB 9.0; Platelets 343; Potassium 3.5; Sodium 138   Lipid Panel  No results found for: CHOL, TRIG, HDL, CHOLHDL, VLDL, LDLCALC, LDLDIRECT    Wt Readings from Last 3 Encounters:  12/19/17 125 lb (56.7 kg)  12/15/17 119 lb 1.6 oz (54 kg)  12/08/17 116 lb (52.6 kg)     Cardiac Studies Reviewed: 2D Echo 07-03-2017: Study Conclusions  - Left ventricle: The cavity size was normal. Wall thickness was   increased in a pattern of moderate LVH. Systolic function was   vigorous. The estimated ejection fraction was in the range of 65%   to 70%. Wall motion was normal; there were no regional wall   motion abnormalities. Features are consistent with a pseudonormal   left ventricular filling pattern, with concomitant abnormal   relaxation and increased filling pressure (grade 2 diastolic   dysfunction). - Left atrium: The atrium was moderately dilated. - Pulmonary arteries: Systolic pressure was moderately increased.   PA peak pressure: 47 mm Hg (S).  Impressions:  - Vigorous LV systolic function; moderate LVH; moderate diastolic   dysfunction; moderate LAE; trace TR with moderately elevated    pulmonary pressure.  ASSESSMENT AND PLAN: Acute on chronic diastolic heart failure, cor pulmonale: Patient with end-stage COPD and extensive stage lung cancer.  He remains on continuous home O2.  He does have worsening leg swelling which may be multifactorial considering his poor nutritional status and inactivity with a component of dependent edema.  Will ask him to use torsemide 20 mg daily as needed, I would like him to use this judiciously and stressed that with him today.   Pulmonary HTN: secondary to severe lung disease/chronic hypoxemia. Has been treated with revatio for many years.   Current medicines are reviewed with  the patient today.  The patient does not have concerns regarding medicines.  Labs/ tests ordered today include:  No orders of the defined types were placed in this encounter.   Disposition:   FU 6 months  Signed, Sherren Mocha, MD  12/19/2017 3:11 PM    [facility] [address], [place], [place]  [address] Phone: [phone]; Fax: [phone]

## 2017-12-19 NOTE — Telephone Encounter (Signed)
He can stop spiriva and continue using albuterol and ipratropium in his nebulizer every 4 hours as needed.

## 2017-12-22 ENCOUNTER — Telehealth: Payer: Self-pay | Admitting: Medical Oncology

## 2017-12-22 ENCOUNTER — Other Ambulatory Visit (HOSPITAL_BASED_OUTPATIENT_CLINIC_OR_DEPARTMENT_OTHER): Payer: Medicare Other

## 2017-12-22 ENCOUNTER — Other Ambulatory Visit: Payer: Self-pay | Admitting: Medical Oncology

## 2017-12-22 DIAGNOSIS — Z5111 Encounter for antineoplastic chemotherapy: Secondary | ICD-10-CM

## 2017-12-22 DIAGNOSIS — C3492 Malignant neoplasm of unspecified part of left bronchus or lung: Secondary | ICD-10-CM | POA: Diagnosis present

## 2017-12-22 DIAGNOSIS — D6481 Anemia due to antineoplastic chemotherapy: Secondary | ICD-10-CM

## 2017-12-22 DIAGNOSIS — T451X5A Adverse effect of antineoplastic and immunosuppressive drugs, initial encounter: Secondary | ICD-10-CM

## 2017-12-22 DIAGNOSIS — C3490 Malignant neoplasm of unspecified part of unspecified bronchus or lung: Secondary | ICD-10-CM

## 2017-12-22 LAB — COMPREHENSIVE METABOLIC PANEL
ALBUMIN: 3.3 g/dL — AB (ref 3.5–5.0)
ALK PHOS: 103 U/L (ref 40–150)
ALT: 9 U/L (ref 0–55)
ANION GAP: 6 meq/L (ref 3–11)
AST: 14 U/L (ref 5–34)
BUN: 19.9 mg/dL (ref 7.0–26.0)
CALCIUM: 9.2 mg/dL (ref 8.4–10.4)
CO2: 38 meq/L — AB (ref 22–29)
CREATININE: 0.7 mg/dL (ref 0.7–1.3)
Chloride: 94 mEq/L — ABNORMAL LOW (ref 98–109)
Glucose: 117 mg/dl (ref 70–140)
Potassium: 3.8 mEq/L (ref 3.5–5.1)
Sodium: 138 mEq/L (ref 136–145)
TOTAL PROTEIN: 5.9 g/dL — AB (ref 6.4–8.3)
Total Bilirubin: 0.73 mg/dL (ref 0.20–1.20)

## 2017-12-22 LAB — CBC WITH DIFFERENTIAL/PLATELET
BASO%: 0.2 % (ref 0.0–2.0)
Basophils Absolute: 0 10*3/uL (ref 0.0–0.1)
EOS ABS: 0 10*3/uL (ref 0.0–0.5)
EOS%: 0.1 % (ref 0.0–7.0)
HEMATOCRIT: 21.9 % — AB (ref 38.4–49.9)
HEMOGLOBIN: 7.1 g/dL — AB (ref 13.0–17.1)
LYMPH#: 1.4 10*3/uL (ref 0.9–3.3)
LYMPH%: 6.2 % — ABNORMAL LOW (ref 14.0–49.0)
MCH: 32.7 pg (ref 27.2–33.4)
MCHC: 32.5 g/dL (ref 32.0–36.0)
MCV: 100.6 fL — AB (ref 79.3–98.0)
MONO#: 0.2 10*3/uL (ref 0.1–0.9)
MONO%: 0.9 % (ref 0.0–14.0)
NEUT%: 92.6 % — ABNORMAL HIGH (ref 39.0–75.0)
NEUTROS ABS: 21.2 10*3/uL — AB (ref 1.5–6.5)
PLATELETS: 219 10*3/uL (ref 140–400)
RBC: 2.17 10*6/uL — AB (ref 4.20–5.82)
RDW: 23.4 % — ABNORMAL HIGH (ref 11.0–14.6)
WBC: 22.9 10*3/uL — AB (ref 4.0–10.3)

## 2017-12-22 NOTE — Telephone Encounter (Signed)
I left message for pt to return call -he needs type and cross and  blood this week . appt requested.

## 2017-12-24 ENCOUNTER — Other Ambulatory Visit: Payer: Self-pay | Admitting: Medical Oncology

## 2017-12-24 ENCOUNTER — Telehealth: Payer: Self-pay | Admitting: Medical Oncology

## 2017-12-24 DIAGNOSIS — D649 Anemia, unspecified: Secondary | ICD-10-CM

## 2017-12-24 NOTE — Telephone Encounter (Signed)
I called with appt for blood transfusion tomorrow at sick[facility] cell  And wife said he cannot come because  pt brothers are coming in tonight from [place] and leaving tomorrow. Pt would like lab and blood transfusion on Friday. Note sent to scheduler. Wife instructed to take pt to ED if symptoms worsen.

## 2017-12-25 ENCOUNTER — Encounter (HOSPITAL_COMMUNITY): Payer: Self-pay

## 2017-12-25 ENCOUNTER — Encounter (HOSPITAL_COMMUNITY): Payer: Medicare Other

## 2017-12-25 ENCOUNTER — Telehealth: Payer: Self-pay | Admitting: Internal Medicine

## 2017-12-25 NOTE — Telephone Encounter (Signed)
Scheduled appt per 1/3 sch message - wife is aware of appts date and time.

## 2017-12-26 ENCOUNTER — Ambulatory Visit (HOSPITAL_COMMUNITY)
Admission: RE | Admit: 2017-12-26 | Discharge: 2017-12-26 | Disposition: A | Discharge status: Home / Self Care | Payer: Medicare Other | Source: Ambulatory Visit | Attending: Internal Medicine | Admitting: Internal Medicine

## 2017-12-26 ENCOUNTER — Inpatient Hospital Stay: Payer: Medicare Other | Attending: Internal Medicine | Admitting: *Deleted

## 2017-12-26 ENCOUNTER — Inpatient Hospital Stay (HOSPITAL_BASED_OUTPATIENT_CLINIC_OR_DEPARTMENT_OTHER): Payer: Medicare Other | Admitting: Medical

## 2017-12-26 ENCOUNTER — Other Ambulatory Visit: Payer: Self-pay | Admitting: Medical Oncology

## 2017-12-26 VITALS — BP 122/74 | HR 81 | Temp 98.5°F | Resp 19 | Wt 117.4 lb

## 2017-12-26 DIAGNOSIS — C3492 Malignant neoplasm of unspecified part of left bronchus or lung: Secondary | ICD-10-CM | POA: Diagnosis present

## 2017-12-26 DIAGNOSIS — Z79899 Other long term (current) drug therapy: Secondary | ICD-10-CM | POA: Insufficient documentation

## 2017-12-26 DIAGNOSIS — C7951 Secondary malignant neoplasm of bone: Secondary | ICD-10-CM

## 2017-12-26 DIAGNOSIS — I509 Heart failure, unspecified: Secondary | ICD-10-CM | POA: Insufficient documentation

## 2017-12-26 DIAGNOSIS — D6481 Anemia due to antineoplastic chemotherapy: Secondary | ICD-10-CM

## 2017-12-26 DIAGNOSIS — T451X5A Adverse effect of antineoplastic and immunosuppressive drugs, initial encounter: Secondary | ICD-10-CM

## 2017-12-26 DIAGNOSIS — J449 Chronic obstructive pulmonary disease, unspecified: Secondary | ICD-10-CM | POA: Insufficient documentation

## 2017-12-26 DIAGNOSIS — D649 Anemia, unspecified: Secondary | ICD-10-CM | POA: Insufficient documentation

## 2017-12-26 DIAGNOSIS — J439 Emphysema, unspecified: Secondary | ICD-10-CM | POA: Insufficient documentation

## 2017-12-26 DIAGNOSIS — C3490 Malignant neoplasm of unspecified part of unspecified bronchus or lung: Secondary | ICD-10-CM

## 2017-12-26 DIAGNOSIS — Z5111 Encounter for antineoplastic chemotherapy: Secondary | ICD-10-CM

## 2017-12-26 DIAGNOSIS — I10 Essential (primary) hypertension: Secondary | ICD-10-CM | POA: Insufficient documentation

## 2017-12-26 LAB — CBC WITH DIFFERENTIAL/PLATELET
BASO%: 1.8 % (ref 0.0–2.0)
BASOS ABS: 0 10*3/uL (ref 0.0–0.1)
EOS ABS: 0 10*3/uL (ref 0.0–0.5)
EOS%: 0 % (ref 0.0–7.0)
HEMATOCRIT: 18.5 % — AB (ref 38.4–49.9)
HEMOGLOBIN: 5.9 g/dL — AB (ref 13.0–17.1)
LYMPH#: 0.6 10*3/uL — AB (ref 0.9–3.3)
LYMPH%: 37.7 % (ref 14.0–49.0)
MCH: 32.8 pg (ref 27.2–33.4)
MCHC: 31.9 g/dL — ABNORMAL LOW (ref 32.0–36.0)
MCV: 102.8 fL — AB (ref 79.3–98.0)
MONO#: 0.1 10*3/uL (ref 0.1–0.9)
MONO%: 6.6 % (ref 0.0–14.0)
NEUT#: 0.9 10*3/uL — ABNORMAL LOW (ref 1.5–6.5)
NEUT%: 53.9 % (ref 39.0–75.0)
PLATELETS: 78 10*3/uL — AB (ref 140–400)
RBC: 1.8 10*6/uL — ABNORMAL LOW (ref 4.20–5.82)
RDW: 20.3 % — AB (ref 11.0–14.6)
WBC: 1.7 10*3/uL — ABNORMAL LOW (ref 4.0–10.3)
nRBC: 0 % (ref 0–0)

## 2017-12-26 LAB — COMPREHENSIVE METABOLIC PANEL
ALT: 13 U/L (ref 0–55)
ANION GAP: 4 meq/L (ref 3–11)
AST: 15 U/L (ref 5–34)
Albumin: 3.4 g/dL — ABNORMAL LOW (ref 3.5–5.0)
Alkaline Phosphatase: 106 U/L (ref 40–150)
BILIRUBIN TOTAL: 0.52 mg/dL (ref 0.20–1.20)
BUN: 16.5 mg/dL (ref 7.0–26.0)
CHLORIDE: 98 meq/L (ref 98–109)
CO2: 36 meq/L — AB (ref 22–29)
Calcium: 9.5 mg/dL (ref 8.4–10.4)
Creatinine: 0.7 mg/dL (ref 0.7–1.3)
GLUCOSE: 98 mg/dL (ref 70–140)
POTASSIUM: 3.8 meq/L (ref 3.5–5.1)
SODIUM: 138 meq/L (ref 136–145)
TOTAL PROTEIN: 6.1 g/dL — AB (ref 6.4–8.3)

## 2017-12-26 LAB — PREPARE RBC (CROSSMATCH)

## 2017-12-26 MED ORDER — DIPHENHYDRAMINE HCL 25 MG PO CAPS
ORAL_CAPSULE | ORAL | Status: AC
  Filled 2017-12-26: qty 1

## 2017-12-26 MED ORDER — SODIUM CHLORIDE 0.9 % IV SOLN
250.0000 mL | Freq: Once | INTRAVENOUS | Status: AC
  Administered 2017-12-26: 250 mL via INTRAVENOUS

## 2017-12-26 MED ORDER — ACETAMINOPHEN 325 MG PO TABS
ORAL_TABLET | ORAL | Status: AC
  Filled 2017-12-26: qty 2

## 2017-12-26 MED ORDER — ACETAMINOPHEN 325 MG PO TABS
650.0000 mg | ORAL_TABLET | Freq: Once | ORAL | Status: AC
  Administered 2017-12-26: 650 mg via ORAL

## 2017-12-26 MED ORDER — DIPHENHYDRAMINE HCL 25 MG PO CAPS
25.0000 mg | ORAL_CAPSULE | Freq: Once | ORAL | Status: AC
  Administered 2017-12-26: 25 mg via ORAL

## 2017-12-26 NOTE — Progress Notes (Signed)
[facility] Progress Note   Jose Mckinney 04/12/1946 72 y.o.  Jose Mckinney is managed by Dr. Eilleen Kempf  Actively treated with chemotherapy: yes  Current Therapy: Etoposide  Last Treated: 12/17/2017  Assessment: Plan:    Small cell lung cancer, left (HCC)  Symptomatic anemia - Plan: acetaminophen (TYLENOL) tablet 650 mg, diphenhydrAMINE (BENADRYL) capsule 25 mg, Transfuse RBC, 0.9 %  sodium chloride infusion, Transfuse RBC, Transfuse RBC  Anemia due to antineoplastic chemotherapy - Plan: acetaminophen (TYLENOL) tablet 650 mg, diphenhydrAMINE (BENADRYL) capsule 25 mg, Transfuse RBC, 0.9 %  sodium chloride infusion, Transfuse RBC, Transfuse RBC   Small cell lung cancer: Patient is status post cycle 5 of chemotherapy with etoposide last dose on 12/18/2017.  He is scheduled for labs only on 12/29/2017 and is scheduled to see Dr. Julien Nordmann on 01/05/2018.  Anemia due to antineoplastic chemotherapy: A CBC returned today with a hemoglobin of 5.9.  The patient was transfused with 2 units of packed red blood cells today.  Please see After Visit Summary for patient specific instructions.  Future Appointments  Date Time Provider [place]  12/27/2017  9:00 AM [facility] B6 [facility] None  12/29/2017  1:30 PM [facility] ONLY [facility] None  01/05/2018  8:30 AM [facility] LAB 1 [facility] None  01/05/2018  9:00 AM Curt Bears, MD [facility] None  01/05/2018 10:00 AM [facility] B4 [facility] None  01/06/2018 10:00 AM [facility] B6 [facility] None  01/07/2018 10:00 AM [facility] A1 [facility] None  01/12/2018  1:30 PM [facility] LAB 6 [facility] None    No orders of the defined types were placed in this encounter.      Subjective:   Patient ID:  Jose Mckinney is a 72 y.o. (DOB 1946-04-28) male.  Chief Complaint:  Chief Complaint  Patient presents with  . Anemia    HPI Jose Mckinney is a 72 year old male with an extensive  stage (T3, and 3, M1 C) small cell lung cancer who presented with widespread malignancy including lymphadenopathy, bony metastatic lesions, left pleural space, and lungs in September 2018.  He was treated initially with chemotherapy with carboplatin and etoposide.  Most recently he received cycle 5 of chemotherapy with etoposide only on 12/17/2017.  He  has had significant anemia secondary to chemotherapy and has received several transfusions. Most recently his labs from 12/22/2017 returned with a hemoglobin of 7.1.  He presents to the office today with a CBC showing a hemoglobin of 5.9.  Plans are to transfuse him with 2 units of packed red blood cells.  He is scheduled to return for labs only on 12/29/2017 and to be seen by Dr. Julien Nordmann in follow-up on 01/05/2018.  Medications: I have reviewed the patient's current medications.  Allergies: No Known Allergies  Past Medical History:  Diagnosis Date  . Anxiety   . CHF (congestive heart failure) (HCC)    x 3 episodes- [place]. pt. living here since 11-'1-16  . COPD with emphysema ([place])   . Depression   . Dyspnea   . HTN (hypertension)   . On home oxygen therapy   . Pulmonary HTN (HCC)    Dr. Marlyn Corporal is following.  . Recurrent left pleural effusion   . Sciatic leg pain    INTERMITTENT- occ. uses Hydrocodone as needed  . Seasonal allergies   . Small cell lung cancer, left ([place]) 09/15/2017    Past Surgical History:  Procedure Laterality Date  . ANGIOPLASTY    . CARDIAC CATHETERIZATION     '  15- [place]- no issues now.  . COLONOSCOPY WITH PROPOFOL N/A 02/09/2016   Procedure: COLONOSCOPY WITH PROPOFOL;  Surgeon: Carol Ada, MD;  Location: [facility];  Service: Endoscopy;  Laterality: N/A;  . IR THORACENTESIS ASP PLEURAL SPACE W/IMG GUIDE  08/08/2017  . MASTOIDECTOMY Left   . TONSILLECTOMY      Family History  Adopted: Yes  Family history unknown: Yes    Social History   Socioeconomic History  . Marital status:  Married    Spouse name: Not on file  . Number of children: Not on file  . Years of education: Not on file  . Highest education level: Not on file  Social Needs  . Financial resource strain: Not on file  . Food insecurity - worry: Not on file  . Food insecurity - inability: Not on file  . Transportation needs - medical: Not on file  . Transportation needs - non-medical: Not on file  Occupational History  . Occupation: Retired  Tobacco Use  . Smoking status: Former Smoker    Packs/day: 2.50    Years: 50.00    Pack years: 125.00    Types: Cigarettes    Last attempt to quit: 07/10/2014    Years since quitting: 3.4  . Smokeless tobacco: Never Used  . Tobacco comment: Smoked 2.5-3 PPD at highest amount.   Substance and Sexual Activity  . Alcohol use: Yes    Alcohol/week: 0.0 oz    Comment: 36 ozs of beer daily   . Drug use: No  . Sexual activity: Not on file  Other Topics Concern  . Not on file  Social History Narrative   Patient is adopted -- UNKNOWN family history    Past Medical History, Surgical history, Social history, and Family history were reviewed and updated as appropriate.   Please see review of systems for further details on the patient's review from today.   Review of Systems:  Review of Systems  Constitutional: Positive for chills and fatigue. Negative for diaphoresis and fever.  Respiratory: Positive for shortness of breath. Negative for cough and chest tightness.   Cardiovascular: Positive for leg swelling. Negative for chest pain and palpitations.    Objective:   Physical Exam:  BP 130/65   Pulse 83   Temp 98.4 F (36.9 C) (Oral)   Resp 20   Wt 117 lb 6.4 oz (53.3 kg)   SpO2 100%   BMI 17.98 kg/m   ECOG: 2  Physical Exam  Constitutional: No distress.  The patient is a chronically ill-appearing adult male who appears much older than his stated age and is receiving oxygen via nasal cannula.  HENT:  Head: Normocephalic and atraumatic.  Eyes:  Right eye exhibits no discharge. Left eye exhibits no discharge. No scleral icterus.  Cardiovascular: S1 normal and S2 normal. Tachycardia present.  Pulmonary/Chest:  Decreased breath sounds throughout all lung fields without wheezes rales or rhonchi.  Neurological: He is alert.  Skin: Skin is warm and dry. No rash noted. He is not diaphoretic. No erythema.    Lab Review:     Component Value Date/Time   NA 138 12/26/2017 1118   K 3.8 12/26/2017 1118   CL 93 (L) 12/08/2017 1027   CO2 36 (H) 12/26/2017 1118   GLUCOSE 98 12/26/2017 1118   BUN 16.5 12/26/2017 1118   CREATININE 0.7 12/26/2017 1118   CALCIUM 9.5 12/26/2017 1118   PROT 6.1 (L) 12/26/2017 1118   ALBUMIN 3.4 (L) 12/26/2017 1118   AST  15 12/26/2017 1118   ALT 13 12/26/2017 1118   ALKPHOS 106 12/26/2017 1118   BILITOT 0.52 12/26/2017 1118   GFRNONAA >60 12/08/2017 1027   GFRAA >60 12/08/2017 1027       Component Value Date/Time   WBC 1.7 (L) 12/26/2017 1119   WBC 8.8 12/08/2017 1027   RBC 1.80 (L) 12/26/2017 1119   RBC 2.29 (L) 12/08/2017 1027   HGB 5.9 (LL) 12/26/2017 1119   HCT 18.5 (L) 12/26/2017 1119   PLT 78 (L) 12/26/2017 1119   MCV 102.8 (H) 12/26/2017 1119   MCH 32.8 12/26/2017 1119   MCH 31.9 12/08/2017 1027   MCHC 31.9 (L) 12/26/2017 1119   MCHC 32.4 12/08/2017 1027   RDW 20.3 (H) 12/26/2017 1119   LYMPHSABS 0.6 (L) 12/26/2017 1119   MONOABS 0.1 12/26/2017 1119   EOSABS 0.0 12/26/2017 1119   BASOSABS 0.0 12/26/2017 1119   -------------------------------  Imaging from last 24 hours (if applicable):  Radiology interpretation: Dg Chest 2 View  Result Date: 12/08/2017 CLINICAL DATA:  Pt c/o sob x 3 days, hx copd, small cell lung cancer with mets to bones. Last chemo treatment 2 weeks ago. Pink tinged sputum coughed up in ED. EXAM: CHEST  2 VIEW COMPARISON:  Plain film of 08/08/2017.  CT 10/31/2017. FINDINGS: Patient rotated to the left on the frontal radiograph. Midline trachea. Moderate  cardiomegaly. Atherosclerosis in the transverse aorta. Pleural-parenchymal opacification in the inferior left hemithorax is not significantly changed. No pneumothorax. Interstitial thickening is progressive. Probable scarring at the right lung base. IMPRESSION: Cardiomegaly with progressive interstitial thickening. Suspicious for mild pulmonary venous congestion, superimposed upon chronic bronchitis. Similar pleural-parenchymal opacification in the inferior left hemithorax. 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 Electronically Signed   By: Abigail Miyamoto M.D.   On: 12/08/2017 11:53   Ct Chest W Contrast  Result Date: 12/11/2017 CLINICAL DATA:  Small cell lung cancer diagnosed 09/15/2017. Chemotherapy in progress. Patient reports cough and chronic shortness of breath with low abdominal pain for 1 year. EXAM: CT CHEST, ABDOMEN, AND PELVIS WITH CONTRAST TECHNIQUE: Multidetector CT imaging of the chest, abdomen and pelvis was performed following the standard protocol during bolus administration of intravenous contrast. CONTRAST:  100 ml Isovue-300. COMPARISON:  Chest radiographs 12/08/2017, CTs of the chest, abdomen and pelvis 10/31/2017 and PET-CT 08/27/2017. FINDINGS: CT CHEST FINDINGS Cardiovascular: Atherosclerosis of the aorta, great vessels and coronary arteries. There is central enlargement of the pulmonary arteries consistent with pulmonary arterial hypertension. No acute vascular findings are seen. There is stable mild cardiomegaly. No significant pericardial fluid. Mediastinum/Nodes: There are no enlarged mediastinal, hilar or axillary lymph nodes. No definite residual enlarged left supraclavicular node. Small mediastinal lymph nodes are present. The trachea, thyroid gland and esophagus demonstrate no significant findings. Lungs/Pleura: Chronic bilateral calcified fibrothoraces again noted, left greater than right. There is increased consolidation medially in the left lower  lobe. Other areas of chronic atelectasis are present in the lingula and left lobe. The subpleural enhancing nodularity previously noted posteriorly in the LEFT lower lobe is less evident (axial image 38 of series 2). There is a stable 8 mm right lower lobe nodule on image 118. There is a stable sub solid lesion peripherally in the right upper lobe (images 52 through 54. Underlying emphysema and central airway thickening noted. Musculoskeletal/Chest wall: Multiple sclerotic osseous metastases are again noted within the spine, ribs, sternum, bilateral scapula and left humeral head. No lytic lesion or pathologic fracture seen. CT ABDOMEN  AND PELVIS FINDINGS Hepatobiliary: The liver appears stable without suspicious findings. There is a small cyst adjacent to the gallbladder. No evidence of gallstones, gallbladder wall thickening or biliary dilatation. Pancreas: Stable small fluid attenuation lesion in the head of the pancreas, measuring 8 mm on image 72. No evidence pancreatic ductal dilatation, enhancing mass or surrounding inflammation. Spleen: Normal in size without focal abnormality. Adrenals/Urinary Tract: Similar appearance of left adrenal nodule measuring 2.3 x 1.4 cm on image 60. This has a nonspecific density of 58 HU. This measured 3 HU on prior PET-CT (image 108). There was subjacent hypermetabolic activity which appeared to be due to lymph nodes on CT. This is probably an incidental adenoma. The right adrenal gland appears normal. Renovascular calcifications bilaterally as well as possible left renal calculi. No evidence of hydronephrosis, ureteral calculus or enhancing renal mass. The bladder appears unremarkable. Stomach/Bowel: No evidence of bowel wall thickening, distention or surrounding inflammatory change. Mild sigmoid diverticulosis. Vascular/Lymphatic: No residual enlarged abdominal or pelvic lymph nodes. There is diffuse aortic and branch vessel atherosclerosis. No evidence of large vessel  occlusion or significant venous abnormality. Reproductive: The prostate gland and seminal vesicles appear unremarkable. Other: Limited subcutaneous and intra- abdominal fat.  No ascites. Musculoskeletal: Multifocal blastic metastases within the spine and pelvis are grossly stable. No lytic lesion or pathologic fracture identified. IMPRESSION: 1. No residual adenopathy demonstrated. 2. Sclerotic osseous metastases are grossly stable. No lytic lesion or pathologic fracture. 3. Increased consolidation in the left lower lobe medially. No discrete residual mass lesion identified. 4. 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 5. Stable left adrenal nodule, likely an adenoma based on prior PET-CT. Electronically Signed   By: Richardean Sale M.D.   On: 12/11/2017 15:49   Ct Abdomen Pelvis W Contrast  Result Date: 12/11/2017 CLINICAL DATA:  Small cell lung cancer diagnosed 09/15/2017. Chemotherapy in progress. Patient reports cough and chronic shortness of breath with low abdominal pain for 1 year. EXAM: CT CHEST, ABDOMEN, AND PELVIS WITH CONTRAST TECHNIQUE: Multidetector CT imaging of the chest, abdomen and pelvis was performed following the standard protocol during bolus administration of intravenous contrast. CONTRAST:  100 ml Isovue-300. COMPARISON:  Chest radiographs 12/08/2017, CTs of the chest, abdomen and pelvis 10/31/2017 and PET-CT 08/27/2017. FINDINGS: CT CHEST FINDINGS Cardiovascular: Atherosclerosis of the aorta, great vessels and coronary arteries. There is central enlargement of the pulmonary arteries consistent with pulmonary arterial hypertension. No acute vascular findings are seen. There is stable mild cardiomegaly. No significant pericardial fluid. Mediastinum/Nodes: There are no enlarged mediastinal, hilar or axillary lymph nodes. No definite residual enlarged left supraclavicular node. Small mediastinal lymph nodes are present. The trachea, thyroid gland and esophagus  demonstrate no significant findings. Lungs/Pleura: Chronic bilateral calcified fibrothoraces again noted, left greater than right. There is increased consolidation medially in the left lower lobe. Other areas of chronic atelectasis are present in the lingula and left lobe. The subpleural enhancing nodularity previously noted posteriorly in the LEFT lower lobe is less evident (axial image 38 of series 2). There is a stable 8 mm right lower lobe nodule on image 118. There is a stable sub solid lesion peripherally in the right upper lobe (images 52 through 54. Underlying emphysema and central airway thickening noted. Musculoskeletal/Chest wall: Multiple sclerotic osseous metastases are again noted within the spine, ribs, sternum, bilateral scapula and left humeral head. No lytic lesion or pathologic fracture seen. CT ABDOMEN AND PELVIS FINDINGS Hepatobiliary: The liver appears stable without suspicious findings. There is a  small cyst adjacent to the gallbladder. No evidence of gallstones, gallbladder wall thickening or biliary dilatation. Pancreas: Stable small fluid attenuation lesion in the head of the pancreas, measuring 8 mm on image 72. No evidence pancreatic ductal dilatation, enhancing mass or surrounding inflammation. Spleen: Normal in size without focal abnormality. Adrenals/Urinary Tract: Similar appearance of left adrenal nodule measuring 2.3 x 1.4 cm on image 60. This has a nonspecific density of 58 HU. This measured 3 HU on prior PET-CT (image 108). There was subjacent hypermetabolic activity which appeared to be due to lymph nodes on CT. This is probably an incidental adenoma. The right adrenal gland appears normal. Renovascular calcifications bilaterally as well as possible left renal calculi. No evidence of hydronephrosis, ureteral calculus or enhancing renal mass. The bladder appears unremarkable. Stomach/Bowel: No evidence of bowel wall thickening, distention or surrounding inflammatory change. Mild  sigmoid diverticulosis. Vascular/Lymphatic: No residual enlarged abdominal or pelvic lymph nodes. There is diffuse aortic and branch vessel atherosclerosis. No evidence of large vessel occlusion or significant venous abnormality. Reproductive: The prostate gland and seminal vesicles appear unremarkable. Other: Limited subcutaneous and intra- abdominal fat.  No ascites. Musculoskeletal: Multifocal blastic metastases within the spine and pelvis are grossly stable. No lytic lesion or pathologic fracture identified. IMPRESSION: 1. No residual adenopathy demonstrated. 2. Sclerotic osseous metastases are grossly stable. No lytic lesion or pathologic fracture. 3. Increased consolidation in the left lower lobe medially. No discrete residual mass lesion identified. 4. 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 5. Stable left adrenal nodule, likely an adenoma based on prior PET-CT. Electronically Signed   By: Richardean Sale M.D.   On: 12/11/2017 15:49

## 2017-12-26 NOTE — Patient Instructions (Addendum)

## 2017-12-27 LAB — TYPE AND SCREEN
ABO/RH(D): A NEG
Antibody Screen: NEGATIVE
UNIT DIVISION: 0
UNIT DIVISION: 0

## 2017-12-27 LAB — BPAM RBC
Blood Product Expiration Date: 201901182359
Blood Product Expiration Date: 201901212359
ISSUE DATE / TIME: 201901041254
ISSUE DATE / TIME: 201901041254
UNIT TYPE AND RH: 600
Unit Type and Rh: 600

## 2017-12-29 ENCOUNTER — Inpatient Hospital Stay: Payer: Medicare Other

## 2017-12-29 DIAGNOSIS — J439 Emphysema, unspecified: Secondary | ICD-10-CM | POA: Diagnosis not present

## 2017-12-29 DIAGNOSIS — I509 Heart failure, unspecified: Secondary | ICD-10-CM | POA: Diagnosis not present

## 2017-12-29 DIAGNOSIS — C3490 Malignant neoplasm of unspecified part of unspecified bronchus or lung: Secondary | ICD-10-CM

## 2017-12-29 DIAGNOSIS — Z5111 Encounter for antineoplastic chemotherapy: Secondary | ICD-10-CM | POA: Diagnosis not present

## 2017-12-29 DIAGNOSIS — J449 Chronic obstructive pulmonary disease, unspecified: Secondary | ICD-10-CM | POA: Diagnosis not present

## 2017-12-29 DIAGNOSIS — C3492 Malignant neoplasm of unspecified part of left bronchus or lung: Secondary | ICD-10-CM | POA: Diagnosis not present

## 2017-12-29 DIAGNOSIS — I10 Essential (primary) hypertension: Secondary | ICD-10-CM | POA: Diagnosis not present

## 2017-12-29 DIAGNOSIS — Z79899 Other long term (current) drug therapy: Secondary | ICD-10-CM | POA: Diagnosis not present

## 2017-12-29 DIAGNOSIS — C7951 Secondary malignant neoplasm of bone: Secondary | ICD-10-CM | POA: Diagnosis not present

## 2017-12-29 LAB — CBC WITH DIFFERENTIAL/PLATELET
ABS GRANULOCYTE: 3.1 10*3/uL (ref 1.5–6.5)
BASOS ABS: 0 10*3/uL (ref 0.0–0.1)
Basophils Relative: 1 %
Eosinophils Absolute: 0 10*3/uL (ref 0.0–0.5)
Eosinophils Relative: 1 %
HEMATOCRIT: 23.6 % — AB (ref 38.4–49.9)
HEMOGLOBIN: 7.8 g/dL — AB (ref 13.0–17.1)
LYMPHS PCT: 19 %
Lymphs Abs: 0.8 10*3/uL — ABNORMAL LOW (ref 0.9–3.3)
MCH: 32.1 pg (ref 27.2–33.4)
MCHC: 32.9 g/dL (ref 32.0–36.0)
MCV: 97.7 fL (ref 79.3–98.0)
MONO ABS: 0.4 10*3/uL (ref 0.1–0.9)
Monocytes Relative: 8 %
NEUTROS ABS: 3.1 10*3/uL (ref 1.5–6.5)
Neutrophils Relative %: 71 %
Platelets: 32 10*3/uL — ABNORMAL LOW (ref 140–400)
RBC: 2.42 MIL/uL — ABNORMAL LOW (ref 4.20–5.82)
RDW: 21.3 % — AB (ref 11.0–15.6)
WBC: 4.3 10*3/uL (ref 4.0–10.3)

## 2017-12-29 LAB — COMPREHENSIVE METABOLIC PANEL
ALBUMIN: 3.5 g/dL (ref 3.5–5.0)
ALT: 13 U/L (ref 0–55)
ANION GAP: 5 (ref 3–11)
AST: 13 U/L (ref 5–34)
Alkaline Phosphatase: 116 U/L (ref 40–150)
BUN: 10 mg/dL (ref 7–26)
CHLORIDE: 98 mmol/L (ref 98–109)
CO2: 36 mmol/L — ABNORMAL HIGH (ref 22–29)
Calcium: 9.7 mg/dL (ref 8.4–10.4)
Creatinine, Ser: 0.69 mg/dL — ABNORMAL LOW (ref 0.70–1.30)
GFR calc Af Amer: >60 mL/min (ref >60)
GFR calc non Af Amer: >60 mL/min (ref >60)
Glucose, Bld: 87 mg/dL (ref 70–140)
POTASSIUM: 4.2 mmol/L (ref 3.5–5.1)
Sodium: 139 mmol/L (ref 136–145)
Total Protein: 6.3 g/dL — ABNORMAL LOW (ref 6.4–8.3)

## 2017-12-30 ENCOUNTER — Encounter (HOSPITAL_COMMUNITY): Payer: Self-pay

## 2018-01-01 ENCOUNTER — Encounter (HOSPITAL_COMMUNITY): Payer: Self-pay

## 2018-01-05 ENCOUNTER — Other Ambulatory Visit: Payer: Self-pay | Admitting: Internal Medicine

## 2018-01-05 ENCOUNTER — Telehealth: Payer: Self-pay | Admitting: Internal Medicine

## 2018-01-05 ENCOUNTER — Inpatient Hospital Stay (HOSPITAL_BASED_OUTPATIENT_CLINIC_OR_DEPARTMENT_OTHER): Payer: Medicare Other | Admitting: Internal Medicine

## 2018-01-05 ENCOUNTER — Encounter: Payer: Self-pay | Admitting: Internal Medicine

## 2018-01-05 ENCOUNTER — Encounter: Payer: Self-pay | Admitting: *Deleted

## 2018-01-05 ENCOUNTER — Inpatient Hospital Stay: Payer: Medicare Other

## 2018-01-05 VITALS — BP 118/57 | HR 99 | Temp 97.6°F | Resp 19 | Ht 67.75 in | Wt 118.6 lb

## 2018-01-05 DIAGNOSIS — Z79899 Other long term (current) drug therapy: Secondary | ICD-10-CM | POA: Diagnosis not present

## 2018-01-05 DIAGNOSIS — C7951 Secondary malignant neoplasm of bone: Secondary | ICD-10-CM | POA: Diagnosis not present

## 2018-01-05 DIAGNOSIS — C3492 Malignant neoplasm of unspecified part of left bronchus or lung: Secondary | ICD-10-CM

## 2018-01-05 DIAGNOSIS — Z5111 Encounter for antineoplastic chemotherapy: Secondary | ICD-10-CM | POA: Diagnosis not present

## 2018-01-05 DIAGNOSIS — C3490 Malignant neoplasm of unspecified part of unspecified bronchus or lung: Secondary | ICD-10-CM

## 2018-01-05 LAB — COMPREHENSIVE METABOLIC PANEL
ALBUMIN: 3.6 g/dL (ref 3.5–5.0)
ALT: 9 U/L (ref 0–55)
AST: 17 U/L (ref 5–34)
Alkaline Phosphatase: 114 U/L (ref 40–150)
Anion gap: 10 (ref 3–11)
BUN: 14 mg/dL (ref 7–26)
CHLORIDE: 97 mmol/L — AB (ref 98–109)
CO2: 33 mmol/L — AB (ref 22–29)
Calcium: 9.5 mg/dL (ref 8.4–10.4)
Creatinine, Ser: 0.87 mg/dL (ref 0.70–1.30)
GFR calc Af Amer: >60 mL/min (ref >60)
Glucose, Bld: 98 mg/dL (ref 70–140)
POTASSIUM: 3.5 mmol/L (ref 3.5–5.1)
SODIUM: 140 mmol/L (ref 136–145)
Total Bilirubin: <0.2 mg/dL — ABNORMAL LOW (ref 0.2–1.2)
Total Protein: 6.8 g/dL (ref 6.4–8.3)

## 2018-01-05 LAB — CBC WITH DIFFERENTIAL/PLATELET
Basophils Absolute: 0 10*3/uL (ref 0.0–0.1)
Basophils Relative: 0 %
EOS PCT: 1 %
Eosinophils Absolute: 0.1 10*3/uL (ref 0.0–0.5)
HCT: 25.7 % — ABNORMAL LOW (ref 38.4–49.9)
HEMOGLOBIN: 8.3 g/dL — AB (ref 13.0–17.1)
LYMPHS ABS: 1.4 10*3/uL (ref 0.9–3.3)
LYMPHS PCT: 11 %
MCH: 32.3 pg (ref 27.2–33.4)
MCHC: 32.5 g/dL (ref 32.0–36.0)
MCV: 99.4 fL — AB (ref 79.3–98.0)
Monocytes Absolute: 1.1 10*3/uL — ABNORMAL HIGH (ref 0.1–0.9)
Monocytes Relative: 9 %
Neutro Abs: 10.1 10*3/uL — ABNORMAL HIGH (ref 1.5–6.5)
Neutrophils Relative %: 79 %
PLATELETS: 219 10*3/uL (ref 140–400)
RBC: 2.58 MIL/uL — AB (ref 4.20–5.82)
RDW: 22.5 % — ABNORMAL HIGH (ref 11.0–15.6)
WBC: 12.8 10*3/uL — AB (ref 4.0–10.3)

## 2018-01-05 MED ORDER — DEXAMETHASONE SODIUM PHOSPHATE 10 MG/ML IJ SOLN
10.0000 mg | Freq: Once | INTRAMUSCULAR | Status: AC
  Administered 2018-01-05: 10 mg via INTRAVENOUS

## 2018-01-05 MED ORDER — PALONOSETRON HCL INJECTION 0.25 MG/5ML
INTRAVENOUS | Status: AC
  Filled 2018-01-05: qty 5

## 2018-01-05 MED ORDER — SODIUM CHLORIDE 0.9 % IV SOLN
376.0000 mg | Freq: Once | INTRAVENOUS | Status: AC
  Administered 2018-01-05: 380 mg via INTRAVENOUS
  Filled 2018-01-05: qty 38

## 2018-01-05 MED ORDER — SODIUM CHLORIDE 0.9 % IV SOLN
Freq: Once | INTRAVENOUS | Status: AC
  Administered 2018-01-05: 10:00:00 via INTRAVENOUS

## 2018-01-05 MED ORDER — DEXAMETHASONE SODIUM PHOSPHATE 10 MG/ML IJ SOLN
INTRAMUSCULAR | Status: AC
  Filled 2018-01-05: qty 1

## 2018-01-05 MED ORDER — PALONOSETRON HCL INJECTION 0.25 MG/5ML
0.2500 mg | Freq: Once | INTRAVENOUS | Status: AC
Start: 2018-01-05 — End: 2018-01-05
  Administered 2018-01-05: 0.25 mg via INTRAVENOUS

## 2018-01-05 MED ORDER — SODIUM CHLORIDE 0.9 % IV SOLN
100.0000 mg/m2 | Freq: Once | INTRAVENOUS | Status: AC
  Administered 2018-01-05: 160 mg via INTRAVENOUS
  Filled 2018-01-05: qty 8

## 2018-01-05 NOTE — Progress Notes (Signed)
[place] [phone]   [phone]  OFFICE PROGRESS NOTE  [name], PA-C [address] [place] [address]  DIAGNOSIS: Extensive stage (T3, N3, M1c) small cell lung cancer presented with widespread malignancy including nodal stations, bones, left pleural space and lungs diagnosed in September 2018.  PRIOR THERAPY: None  CURRENT THERAPY: Systemic chemotherapy with carboplatin for AUC 5 on day 1 and etoposide 100 mg/M2 on days 1, 2 and 3 with Neulasta support. Status post 5 cycles.  INTERVAL HISTORY: Jose Mckinney 72 y.o. male returns to the clinic today for follow-up visit accompanied by his wife.  The patient tolerated the last cycle of his treatment fairly well.  He had significant chemotherapy-induced anemia and he received 2 units of PRBCs transfusion recently.  He denied having any current chest pain but has a baseline shortness of breath and he is currently on home oxygen.  He denied having any nausea, vomiting, diarrhea or constipation.  He has no fever or chills.  He has occasional pain on the medial side of his ankle.  He is here today for evaluation before starting cycle #6 of his treatment.   MEDICAL HISTORY: Past Medical History:  Diagnosis Date  . Anxiety   . CHF (congestive heart failure) (HCC)    x 3 episodes- [place]. pt. living here since 11-'1-16  . 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 Depression   . Dyspnea   . HTN (hypertension)   . On home oxygen therapy   . Pulmonary HTN (HCC)    Dr. Marlyn Corporal is following.  . Recurrent left pleural effusion   . Sciatic leg pain    INTERMITTENT- occ. uses Hydrocodone as needed  . Seasonal allergies   . Small cell lung cancer, left ([facility]) 09/15/2017    ALLERGIES:  has No Known Allergies.  MEDICATIONS:  Current Outpatient Medications  Medication Sig Dispense Refill  . albuterol (PROVENTIL) (2.5 MG/3ML) 0.083% nebulizer solution Take 3 mLs (2.5 mg total) by  nebulization every 6 (six) hours as needed for wheezing or shortness of breath. 75 mL 12  . dronabinol (MARINOL) 2.5 MG capsule Take 1 capsule (2.5 mg total) by mouth 2 (two) times daily before a meal. 60 capsule 0  . feeding supplement (BOOST HIGH PROTEIN) LIQD Take 1 Container by mouth daily.     . fluticasone (FLONASE) 50 MCG/ACT nasal spray Place 2 sprays into both nostrils daily as needed for allergies or rhinitis.    Marland Kitchen ibuprofen (ADVIL,MOTRIN) 200 MG tablet Take 600 mg by mouth 3 (three) times daily as needed (pain).     Marland Kitchen ipratropium (ATROVENT) 0.02 % nebulizer solution Take 0.5 mg by nebulization every 4 (four) hours as needed for wheezing or shortness of breath.    . loratadine (CLARITIN) 10 MG tablet Take 10 mg by mouth daily as needed for allergies.    Marland Kitchen prochlorperazine (COMPAZINE) 10 MG tablet Take 1 tablet (10 mg total) by mouth every 6 (six) hours as needed for nausea or vomiting. 30 tablet 1  . sertraline (ZOLOFT) 50 MG tablet Take 50 mg by mouth 2 (two) times daily.    . sildenafil (REVATIO) 20 MG tablet Take 20 mg by mouth as directed.    . torsemide (DEMADEX) 20 MG tablet Take 1 tablet (20 mg total) by mouth daily as needed. 30 tablet 6   No current facility-administered medications for this visit.     SURGICAL HISTORY:  Past Surgical  History:  Procedure Laterality Date  . ANGIOPLASTY    . CARDIAC CATHETERIZATION     '15- [place]- no issues now.  . COLONOSCOPY WITH PROPOFOL N/A 02/09/2016   Procedure: COLONOSCOPY WITH PROPOFOL;  Surgeon: Carol Ada, MD;  Location: [facility];  Service: Endoscopy;  Laterality: N/A;  . IR THORACENTESIS ASP PLEURAL SPACE W/IMG GUIDE  08/08/2017  . MASTOIDECTOMY Left   . TONSILLECTOMY      REVIEW OF SYSTEMS:  A comprehensive review of systems was negative except for: Constitutional: positive for fatigue Respiratory: positive for dyspnea on exertion Musculoskeletal: positive for arthralgias   PHYSICAL EXAMINATION: General  appearance: alert, cooperative, fatigued and no distress Head: Normocephalic, without obvious abnormality, atraumatic Neck: no adenopathy, no JVD, supple, symmetrical, trachea midline and thyroid not enlarged, symmetric, no tenderness/mass/nodules Lymph nodes: Cervical, supraclavicular, and axillary nodes normal. Resp: wheezes bilaterally Back: symmetric, no curvature. ROM normal. No CVA tenderness. Cardio: regular rate and rhythm, S1, S2 normal, no murmur, click, rub or gallop GI: soft, non-tender; bowel sounds normal; no masses,  no organomegaly Extremities: extremities normal, atraumatic, no cyanosis or edema  ECOG PERFORMANCE STATUS: 1 - Symptomatic but completely ambulatory  Blood pressure (!) 118/57, pulse 99, temperature 97.6 F (36.4 C), temperature source Oral, resp. rate 19, height 5' 7.75" (1.721 m), weight 118 lb 9.6 oz (53.8 kg), SpO2 99 %.  LABORATORY DATA: Lab Results  Component Value Date   WBC 12.8 (H) 01/05/2018   HGB 8.3 (L) 01/05/2018   HCT 25.7 (L) 01/05/2018   MCV 99.4 (H) 01/05/2018   PLT 219 01/05/2018      Chemistry      Component Value Date/Time   NA 139 12/29/2017 1329   NA 138 12/26/2017 1118   K 4.2 12/29/2017 1329   K 3.8 12/26/2017 1118   CL 98 12/29/2017 1329   CO2 36 (H) 12/29/2017 1329   CO2 36 (H) 12/26/2017 1118   BUN 10 12/29/2017 1329   BUN 16.5 12/26/2017 1118   CREATININE 0.69 (L) 12/29/2017 1329   CREATININE 0.7 12/26/2017 1118      Component Value Date/Time   CALCIUM 9.7 12/29/2017 1329   CALCIUM 9.5 12/26/2017 1118   ALKPHOS 116 12/29/2017 1329   ALKPHOS 106 12/26/2017 1118   AST 13 12/29/2017 1329   AST 15 12/26/2017 1118   ALT 13 12/29/2017 1329   ALT 13 12/26/2017 1118   BILITOT <0.2 (L) 12/29/2017 1329   BILITOT 0.52 12/26/2017 1118       RADIOGRAPHIC STUDIES: Dg Chest 2 View  Result Date: 12/08/2017 CLINICAL DATA:  Pt c/o sob x 3 days, hx copd, small cell lung cancer with mets to bones. Last chemo treatment 2  weeks ago. Pink tinged sputum coughed up in ED. EXAM: CHEST  2 VIEW COMPARISON:  Plain film of 08/08/2017.  CT 10/31/2017. FINDINGS: Patient rotated to the left on the frontal radiograph. Midline trachea. Moderate cardiomegaly. Atherosclerosis in the transverse aorta. Pleural-parenchymal opacification in the inferior left hemithorax is not significantly changed. No pneumothorax. Interstitial thickening is progressive. Probable scarring at the right lung base. IMPRESSION: Cardiomegaly with progressive interstitial thickening. Suspicious for mild pulmonary venous congestion, superimposed upon chronic bronchitis. Similar pleural-parenchymal opacification in the inferior left hemithorax. 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 Electronically Signed   By: Abigail Miyamoto M.D.   On: 12/08/2017 11:53   Ct Chest W Contrast  Result Date: 12/11/2017 CLINICAL DATA:  Small cell lung cancer diagnosed 09/15/2017. Chemotherapy in progress.  Patient reports cough and chronic shortness of breath with low abdominal pain for 1 year. EXAM: CT CHEST, ABDOMEN, AND PELVIS WITH CONTRAST TECHNIQUE: Multidetector CT imaging of the chest, abdomen and pelvis was performed following the standard protocol during bolus administration of intravenous contrast. CONTRAST:  100 ml Isovue-300. COMPARISON:  Chest radiographs 12/08/2017, CTs of the chest, abdomen and pelvis 10/31/2017 and PET-CT 08/27/2017. FINDINGS: CT CHEST FINDINGS Cardiovascular: Atherosclerosis of the aorta, great vessels and coronary arteries. There is central enlargement of the pulmonary arteries consistent with pulmonary arterial hypertension. No acute vascular findings are seen. There is stable mild cardiomegaly. No significant pericardial fluid. Mediastinum/Nodes: There are no enlarged mediastinal, hilar or axillary lymph nodes. No definite residual enlarged left supraclavicular node. Small mediastinal lymph nodes are present. The trachea, thyroid  gland and esophagus demonstrate no significant findings. Lungs/Pleura: Chronic bilateral calcified fibrothoraces again noted, left greater than right. There is increased consolidation medially in the left lower lobe. Other areas of chronic atelectasis are present in the lingula and left lobe. The subpleural enhancing nodularity previously noted posteriorly in the LEFT lower lobe is less evident (axial image 38 of series 2). There is a stable 8 mm right lower lobe nodule on image 118. There is a stable sub solid lesion peripherally in the right upper lobe (images 52 through 54. Underlying emphysema and central airway thickening noted. Musculoskeletal/Chest wall: Multiple sclerotic osseous metastases are again noted within the spine, ribs, sternum, bilateral scapula and left humeral head. No lytic lesion or pathologic fracture seen. CT ABDOMEN AND PELVIS FINDINGS Hepatobiliary: The liver appears stable without suspicious findings. There is a small cyst adjacent to the gallbladder. No evidence of gallstones, gallbladder wall thickening or biliary dilatation. Pancreas: Stable small fluid attenuation lesion in the head of the pancreas, measuring 8 mm on image 72. No evidence pancreatic ductal dilatation, enhancing mass or surrounding inflammation. Spleen: Normal in size without focal abnormality. Adrenals/Urinary Tract: Similar appearance of left adrenal nodule measuring 2.3 x 1.4 cm on image 60. This has a nonspecific density of 58 HU. This measured 3 HU on prior PET-CT (image 108). There was subjacent hypermetabolic activity which appeared to be due to lymph nodes on CT. This is probably an incidental adenoma. The right adrenal gland appears normal. Renovascular calcifications bilaterally as well as possible left renal calculi. No evidence of hydronephrosis, ureteral calculus or enhancing renal mass. The bladder appears unremarkable. Stomach/Bowel: No evidence of bowel wall thickening, distention or surrounding  inflammatory change. Mild sigmoid diverticulosis. Vascular/Lymphatic: No residual enlarged abdominal or pelvic lymph nodes. There is diffuse aortic and branch vessel atherosclerosis. No evidence of large vessel occlusion or significant venous abnormality. Reproductive: The prostate gland and seminal vesicles appear unremarkable. Other: Limited subcutaneous and intra- abdominal fat.  No ascites. Musculoskeletal: Multifocal blastic metastases within the spine and pelvis are grossly stable. No lytic lesion or pathologic fracture identified. IMPRESSION: 1. No residual adenopathy demonstrated. 2. Sclerotic osseous metastases are grossly stable. No lytic lesion or pathologic fracture. 3. Increased consolidation in the left lower lobe medially. No discrete residual mass lesion identified. 4. 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 5. Stable left adrenal nodule, likely an adenoma based on prior PET-CT. Electronically Signed   By: Richardean Sale M.D.   On: 12/11/2017 15:49   Ct Abdomen Pelvis W Contrast  Result Date: 12/11/2017 CLINICAL DATA:  Small cell lung cancer diagnosed 09/15/2017. Chemotherapy in progress. Patient reports cough and chronic shortness of breath with low abdominal pain for 1  year. EXAM: CT CHEST, ABDOMEN, AND PELVIS WITH CONTRAST TECHNIQUE: Multidetector CT imaging of the chest, abdomen and pelvis was performed following the standard protocol during bolus administration of intravenous contrast. CONTRAST:  100 ml Isovue-300. COMPARISON:  Chest radiographs 12/08/2017, CTs of the chest, abdomen and pelvis 10/31/2017 and PET-CT 08/27/2017. FINDINGS: CT CHEST FINDINGS Cardiovascular: Atherosclerosis of the aorta, great vessels and coronary arteries. There is central enlargement of the pulmonary arteries consistent with pulmonary arterial hypertension. No acute vascular findings are seen. There is stable mild cardiomegaly. No significant pericardial fluid. Mediastinum/Nodes:  There are no enlarged mediastinal, hilar or axillary lymph nodes. No definite residual enlarged left supraclavicular node. Small mediastinal lymph nodes are present. The trachea, thyroid gland and esophagus demonstrate no significant findings. Lungs/Pleura: Chronic bilateral calcified fibrothoraces again noted, left greater than right. There is increased consolidation medially in the left lower lobe. Other areas of chronic atelectasis are present in the lingula and left lobe. The subpleural enhancing nodularity previously noted posteriorly in the LEFT lower lobe is less evident (axial image 38 of series 2). There is a stable 8 mm right lower lobe nodule on image 118. There is a stable sub solid lesion peripherally in the right upper lobe (images 52 through 54. Underlying emphysema and central airway thickening noted. Musculoskeletal/Chest wall: Multiple sclerotic osseous metastases are again noted within the spine, ribs, sternum, bilateral scapula and left humeral head. No lytic lesion or pathologic fracture seen. CT ABDOMEN AND PELVIS FINDINGS Hepatobiliary: The liver appears stable without suspicious findings. There is a small cyst adjacent to the gallbladder. No evidence of gallstones, gallbladder wall thickening or biliary dilatation. Pancreas: Stable small fluid attenuation lesion in the head of the pancreas, measuring 8 mm on image 72. No evidence pancreatic ductal dilatation, enhancing mass or surrounding inflammation. Spleen: Normal in size without focal abnormality. Adrenals/Urinary Tract: Similar appearance of left adrenal nodule measuring 2.3 x 1.4 cm on image 60. This has a nonspecific density of 58 HU. This measured 3 HU on prior PET-CT (image 108). There was subjacent hypermetabolic activity which appeared to be due to lymph nodes on CT. This is probably an incidental adenoma. The right adrenal gland appears normal. Renovascular calcifications bilaterally as well as possible left renal calculi. No  evidence of hydronephrosis, ureteral calculus or enhancing renal mass. The bladder appears unremarkable. Stomach/Bowel: No evidence of bowel wall thickening, distention or surrounding inflammatory change. Mild sigmoid diverticulosis. Vascular/Lymphatic: No residual enlarged abdominal or pelvic lymph nodes. There is diffuse aortic and branch vessel atherosclerosis. No evidence of large vessel occlusion or significant venous abnormality. Reproductive: The prostate gland and seminal vesicles appear unremarkable. Other: Limited subcutaneous and intra- abdominal fat.  No ascites. Musculoskeletal: Multifocal blastic metastases within the spine and pelvis are grossly stable. No lytic lesion or pathologic fracture identified. IMPRESSION: 1. No residual adenopathy demonstrated. 2. Sclerotic osseous metastases are grossly stable. No lytic lesion or pathologic fracture. 3. Increased consolidation in the left lower lobe medially. No discrete residual mass lesion identified. 4. 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 5. Stable left adrenal nodule, likely an adenoma based on prior PET-CT. Electronically Signed   By: Richardean Sale M.D.   On: 12/11/2017 15:49    ASSESSMENT AND PLAN: This is a very pleasant 72 years old white male recently diagnosed with extensive stage small cell lung cancer and currently undergoing systemic chemotherapy with carboplatin and etoposide status post 5 cycles. The patient continues to tolerate this treatment fairly well with no significant adverse  effects. I recommended for the patient to proceed with cycle #6 today as a scheduled. I will see him back for follow-up visit in 4 weeks for evaluation after repeating CT scan of the chest, abdomen and pelvis for restaging of his disease. The patient voices understanding of current disease status and treatment options and is in agreement with the current care plan. All questions were answered. The patient knows to call the  clinic with any problems, questions or concerns. We can certainly see the patient much sooner if necessary.   Disclaimer: This note was dictated with voice recognition software. Similar sounding words can inadvertently be transcribed and may not be corrected upon review.

## 2018-01-05 NOTE — Telephone Encounter (Signed)
Scheduled appt per 1/14 los - Gave patient AVS and calender per los.  

## 2018-01-05 NOTE — Patient Instructions (Signed)
[facility] Discharge Instructions for Patients Receiving Chemotherapy  Today you received the following chemotherapy agents Carboplatin & Etoposide   To help prevent nausea and vomiting after your treatment, we encourage you to take your nausea medication as directed  If you develop nausea and vomiting that is not controlled by your nausea medication, call the clinic.   BELOW ARE SYMPTOMS THAT SHOULD BE REPORTED IMMEDIATELY:  *FEVER GREATER THAN 100.5 F  *CHILLS WITH OR WITHOUT FEVER  NAUSEA AND VOMITING THAT IS NOT CONTROLLED WITH YOUR NAUSEA MEDICATION  *UNUSUAL SHORTNESS OF BREATH  *UNUSUAL BRUISING OR BLEEDING  TENDERNESS IN MOUTH AND THROAT WITH OR WITHOUT PRESENCE OF ULCERS  *URINARY PROBLEMS  *BOWEL PROBLEMS  UNUSUAL RASH Items with * indicate a potential emergency and should be followed up as soon as possible.  Feel free to call the clinic you have any questions or concerns. The clinic phone number is [phone].

## 2018-01-06 ENCOUNTER — Encounter (HOSPITAL_COMMUNITY): Payer: Self-pay

## 2018-01-06 ENCOUNTER — Inpatient Hospital Stay: Payer: Medicare Other

## 2018-01-06 DIAGNOSIS — C3492 Malignant neoplasm of unspecified part of left bronchus or lung: Secondary | ICD-10-CM

## 2018-01-06 DIAGNOSIS — Z5111 Encounter for antineoplastic chemotherapy: Secondary | ICD-10-CM | POA: Diagnosis not present

## 2018-01-06 MED ORDER — DEXAMETHASONE SODIUM PHOSPHATE 10 MG/ML IJ SOLN
10.0000 mg | Freq: Once | INTRAMUSCULAR | Status: AC
  Administered 2018-01-06: 10 mg via INTRAVENOUS

## 2018-01-06 MED ORDER — SODIUM CHLORIDE 0.9 % IV SOLN
100.0000 mg/m2 | Freq: Once | INTRAVENOUS | Status: AC
  Administered 2018-01-06: 160 mg via INTRAVENOUS
  Filled 2018-01-06: qty 8

## 2018-01-06 MED ORDER — SODIUM CHLORIDE 0.9 % IV SOLN
Freq: Once | INTRAVENOUS | Status: AC
  Administered 2018-01-06: 10:00:00 via INTRAVENOUS

## 2018-01-06 MED ORDER — DEXAMETHASONE SODIUM PHOSPHATE 10 MG/ML IJ SOLN
INTRAMUSCULAR | Status: AC
  Filled 2018-01-06: qty 1

## 2018-01-06 NOTE — Patient Instructions (Signed)
[facility] Discharge Instructions for Patients Receiving Chemotherapy  Today you received the following chemotherapy agents  Etoposide   To help prevent nausea and vomiting after your treatment, we encourage you to take your nausea medication as directed  If you develop nausea and vomiting that is not controlled by your nausea medication, call the clinic.   BELOW ARE SYMPTOMS THAT SHOULD BE REPORTED IMMEDIATELY:  *FEVER GREATER THAN 100.5 F  *CHILLS WITH OR WITHOUT FEVER  NAUSEA AND VOMITING THAT IS NOT CONTROLLED WITH YOUR NAUSEA MEDICATION  *UNUSUAL SHORTNESS OF BREATH  *UNUSUAL BRUISING OR BLEEDING  TENDERNESS IN MOUTH AND THROAT WITH OR WITHOUT PRESENCE OF ULCERS  *URINARY PROBLEMS  *BOWEL PROBLEMS  UNUSUAL RASH Items with * indicate a potential emergency and should be followed up as soon as possible.  Feel free to call the clinic you have any questions or concerns. The clinic phone number is [phone].

## 2018-01-07 ENCOUNTER — Inpatient Hospital Stay: Payer: Medicare Other

## 2018-01-07 ENCOUNTER — Other Ambulatory Visit: Payer: Self-pay | Admitting: Medical Oncology

## 2018-01-07 VITALS — BP 105/61 | HR 87 | Temp 97.7°F | Resp 19

## 2018-01-07 DIAGNOSIS — T451X5A Adverse effect of antineoplastic and immunosuppressive drugs, initial encounter: Principal | ICD-10-CM

## 2018-01-07 DIAGNOSIS — Z5111 Encounter for antineoplastic chemotherapy: Secondary | ICD-10-CM | POA: Diagnosis not present

## 2018-01-07 DIAGNOSIS — D6481 Anemia due to antineoplastic chemotherapy: Secondary | ICD-10-CM

## 2018-01-07 DIAGNOSIS — C3492 Malignant neoplasm of unspecified part of left bronchus or lung: Secondary | ICD-10-CM

## 2018-01-07 MED ORDER — ETOPOSIDE CHEMO INJECTION 1 GM/50ML
100.0000 mg/m2 | Freq: Once | INTRAVENOUS | Status: AC
  Administered 2018-01-07: 160 mg via INTRAVENOUS
  Filled 2018-01-07: qty 8

## 2018-01-07 MED ORDER — SODIUM CHLORIDE 0.9 % IV SOLN
Freq: Once | INTRAVENOUS | Status: AC
  Administered 2018-01-07: 10:00:00 via INTRAVENOUS

## 2018-01-07 MED ORDER — PEGFILGRASTIM 6 MG/0.6ML ~~LOC~~ PSKT
PREFILLED_SYRINGE | SUBCUTANEOUS | Status: AC
  Filled 2018-01-07: qty 0.6

## 2018-01-07 MED ORDER — SODIUM CHLORIDE 0.9% FLUSH
10.0000 mL | INTRAVENOUS | Status: DC | PRN
  Filled 2018-01-07: qty 10

## 2018-01-07 MED ORDER — HEPARIN SOD (PORK) LOCK FLUSH 100 UNIT/ML IV SOLN
500.0000 [IU] | Freq: Once | INTRAVENOUS | Status: DC | PRN
  Filled 2018-01-07: qty 5

## 2018-01-07 MED ORDER — DEXAMETHASONE SODIUM PHOSPHATE 10 MG/ML IJ SOLN
INTRAMUSCULAR | Status: AC
  Filled 2018-01-07: qty 1

## 2018-01-07 MED ORDER — DEXAMETHASONE SODIUM PHOSPHATE 10 MG/ML IJ SOLN
10.0000 mg | Freq: Once | INTRAMUSCULAR | Status: AC
  Administered 2018-01-07: 10 mg via INTRAVENOUS

## 2018-01-07 MED ORDER — PEGFILGRASTIM 6 MG/0.6ML ~~LOC~~ PSKT
6.0000 mg | PREFILLED_SYRINGE | Freq: Once | SUBCUTANEOUS | Status: AC
  Administered 2018-01-07: 6 mg via SUBCUTANEOUS

## 2018-01-07 NOTE — Patient Instructions (Signed)
[facility] Discharge Instructions for Patients Receiving Chemotherapy  Today you received the following chemotherapy agents  Etoposide   To help prevent nausea and vomiting after your treatment, we encourage you to take your nausea medication as directed  If you develop nausea and vomiting that is not controlled by your nausea medication, call the clinic.   BELOW ARE SYMPTOMS THAT SHOULD BE REPORTED IMMEDIATELY:  *FEVER GREATER THAN 100.5 F  *CHILLS WITH OR WITHOUT FEVER  NAUSEA AND VOMITING THAT IS NOT CONTROLLED WITH YOUR NAUSEA MEDICATION  *UNUSUAL SHORTNESS OF BREATH  *UNUSUAL BRUISING OR BLEEDING  TENDERNESS IN MOUTH AND THROAT WITH OR WITHOUT PRESENCE OF ULCERS  *URINARY PROBLEMS  *BOWEL PROBLEMS  UNUSUAL RASH Items with * indicate a potential emergency and should be followed up as soon as possible.  Feel free to call the clinic you have any questions or concerns. The clinic phone number is [phone].

## 2018-01-08 ENCOUNTER — Encounter (HOSPITAL_COMMUNITY): Payer: Self-pay

## 2018-01-12 ENCOUNTER — Inpatient Hospital Stay: Payer: Medicare Other

## 2018-01-12 DIAGNOSIS — Z5111 Encounter for antineoplastic chemotherapy: Secondary | ICD-10-CM | POA: Diagnosis not present

## 2018-01-12 DIAGNOSIS — C3490 Malignant neoplasm of unspecified part of unspecified bronchus or lung: Secondary | ICD-10-CM

## 2018-01-12 DIAGNOSIS — D6481 Anemia due to antineoplastic chemotherapy: Secondary | ICD-10-CM

## 2018-01-12 DIAGNOSIS — T451X5A Adverse effect of antineoplastic and immunosuppressive drugs, initial encounter: Principal | ICD-10-CM

## 2018-01-12 LAB — COMPREHENSIVE METABOLIC PANEL
ALBUMIN: 3.4 g/dL — AB (ref 3.5–5.0)
ALT: 9 U/L (ref 0–55)
AST: 14 U/L (ref 5–34)
Alkaline Phosphatase: 124 U/L (ref 40–150)
Anion gap: 8 (ref 3–11)
BILIRUBIN TOTAL: 0.4 mg/dL (ref 0.2–1.2)
BUN: 23 mg/dL (ref 7–26)
CALCIUM: 9.4 mg/dL (ref 8.4–10.4)
CO2: 35 mmol/L — AB (ref 22–29)
Chloride: 96 mmol/L — ABNORMAL LOW (ref 98–109)
Creatinine, Ser: 0.76 mg/dL (ref 0.70–1.30)
GFR calc non Af Amer: >60 mL/min (ref >60)
GLUCOSE: 131 mg/dL (ref 70–140)
Potassium: 4.1 mmol/L (ref 3.5–5.1)
SODIUM: 139 mmol/L (ref 136–145)
TOTAL PROTEIN: 6.1 g/dL — AB (ref 6.4–8.3)

## 2018-01-12 LAB — CBC WITH DIFFERENTIAL/PLATELET
Basophils Absolute: 0.1 10*3/uL (ref 0.0–0.1)
Basophils Relative: 1 %
EOS ABS: 0 10*3/uL (ref 0.0–0.5)
Eosinophils Relative: 0 %
HEMATOCRIT: 23.4 % — AB (ref 38.4–49.9)
Hemoglobin: 7.5 g/dL — ABNORMAL LOW (ref 13.0–17.1)
LYMPHS ABS: 0.8 10*3/uL — AB (ref 0.9–3.3)
Lymphocytes Relative: 6 %
MCH: 32.5 pg (ref 27.2–33.4)
MCHC: 32.2 g/dL (ref 32.0–36.0)
MCV: 101.1 fL — ABNORMAL HIGH (ref 79.3–98.0)
MONOS PCT: 2 %
Monocytes Absolute: 0.2 10*3/uL (ref 0.1–0.9)
Neutro Abs: 11.1 10*3/uL — ABNORMAL HIGH (ref 1.5–6.5)
Neutrophils Relative %: 91 %
Platelets: 249 10*3/uL (ref 140–400)
RBC: 2.31 MIL/uL — AB (ref 4.20–5.82)
RDW: 21.9 % — ABNORMAL HIGH (ref 11.0–15.6)
WBC: 12.1 10*3/uL — AB (ref 4.0–10.3)

## 2018-01-12 LAB — SAMPLE TO BLOOD BANK

## 2018-01-13 ENCOUNTER — Inpatient Hospital Stay: Payer: Medicare Other

## 2018-01-13 ENCOUNTER — Encounter (HOSPITAL_COMMUNITY): Payer: Self-pay

## 2018-01-13 ENCOUNTER — Other Ambulatory Visit: Payer: Self-pay | Admitting: Medical Oncology

## 2018-01-13 DIAGNOSIS — Z5111 Encounter for antineoplastic chemotherapy: Secondary | ICD-10-CM | POA: Diagnosis not present

## 2018-01-13 DIAGNOSIS — D649 Anemia, unspecified: Secondary | ICD-10-CM

## 2018-01-13 LAB — ABO/RH: ABO/RH(D): A NEG

## 2018-01-13 LAB — PREPARE RBC (CROSSMATCH)

## 2018-01-13 MED ORDER — DIPHENHYDRAMINE HCL 25 MG PO CAPS
25.0000 mg | ORAL_CAPSULE | Freq: Once | ORAL | Status: AC
  Administered 2018-01-13: 25 mg via ORAL

## 2018-01-13 MED ORDER — SODIUM CHLORIDE 0.9% FLUSH
10.0000 mL | INTRAVENOUS | Status: DC | PRN
  Filled 2018-01-13: qty 10

## 2018-01-13 MED ORDER — DIPHENHYDRAMINE HCL 25 MG PO CAPS
ORAL_CAPSULE | ORAL | Status: AC
  Filled 2018-01-13: qty 1

## 2018-01-13 MED ORDER — SODIUM CHLORIDE 0.9 % IV SOLN
250.0000 mL | Freq: Once | INTRAVENOUS | Status: AC
  Administered 2018-01-13: 250 mL via INTRAVENOUS

## 2018-01-13 MED ORDER — HEPARIN SOD (PORK) LOCK FLUSH 100 UNIT/ML IV SOLN
500.0000 [IU] | Freq: Every day | INTRAVENOUS | Status: DC | PRN
  Filled 2018-01-13: qty 5

## 2018-01-13 MED ORDER — ACETAMINOPHEN 325 MG PO TABS
650.0000 mg | ORAL_TABLET | Freq: Once | ORAL | Status: AC
  Administered 2018-01-13: 650 mg via ORAL

## 2018-01-13 MED ORDER — ACETAMINOPHEN 325 MG PO TABS
ORAL_TABLET | ORAL | Status: AC
  Filled 2018-01-13: qty 2

## 2018-01-13 NOTE — Patient Instructions (Signed)

## 2018-01-14 LAB — BPAM RBC
BLOOD PRODUCT EXPIRATION DATE: 201902032359
BLOOD PRODUCT EXPIRATION DATE: 201902032359
ISSUE DATE / TIME: 201901221334
ISSUE DATE / TIME: 201901221334
UNIT TYPE AND RH: 600
Unit Type and Rh: 600

## 2018-01-14 LAB — TYPE AND SCREEN
ABO/RH(D): A NEG
Antibody Screen: NEGATIVE
Unit division: 0
Unit division: 0

## 2018-01-15 ENCOUNTER — Encounter (HOSPITAL_COMMUNITY): Payer: Self-pay

## 2018-01-19 ENCOUNTER — Telehealth: Payer: Self-pay | Admitting: Internal Medicine

## 2018-01-19 ENCOUNTER — Inpatient Hospital Stay: Payer: Medicare Other

## 2018-01-19 ENCOUNTER — Other Ambulatory Visit: Payer: Self-pay | Admitting: Medical Oncology

## 2018-01-19 ENCOUNTER — Telehealth: Payer: Self-pay | Admitting: Medical Oncology

## 2018-01-19 DIAGNOSIS — Z5111 Encounter for antineoplastic chemotherapy: Secondary | ICD-10-CM | POA: Diagnosis not present

## 2018-01-19 DIAGNOSIS — C3492 Malignant neoplasm of unspecified part of left bronchus or lung: Secondary | ICD-10-CM

## 2018-01-19 LAB — CBC WITH DIFFERENTIAL (CANCER CENTER ONLY)
BASOS ABS: 0 10*3/uL (ref 0.0–0.1)
BASOS PCT: 1 %
EOS ABS: 0 10*3/uL (ref 0.0–0.5)
Eosinophils Relative: 0 %
HEMATOCRIT: 23.6 % — AB (ref 38.4–49.9)
HEMOGLOBIN: 7.5 g/dL — AB (ref 13.0–17.1)
Lymphocytes Relative: 20 %
Lymphs Abs: 0.7 10*3/uL — ABNORMAL LOW (ref 0.9–3.3)
MCH: 31.2 pg (ref 27.2–33.4)
MCHC: 31.9 g/dL — AB (ref 32.0–36.0)
MCV: 97.8 fL (ref 79.3–98.0)
MONOS PCT: 7 %
Monocytes Absolute: 0.3 10*3/uL (ref 0.1–0.9)
NEUTROS ABS: 2.5 10*3/uL (ref 1.5–6.5)
Neutrophils Relative %: 72 %
Platelet Count: 28 10*3/uL — ABNORMAL LOW (ref 140–400)
RBC: 2.42 MIL/uL — ABNORMAL LOW (ref 4.20–5.82)
RDW: 20.5 % — AB (ref 11.0–15.6)
WBC Count: 3.5 10*3/uL — ABNORMAL LOW (ref 4.0–10.3)

## 2018-01-19 LAB — CMP (CANCER CENTER ONLY)
ALBUMIN: 3.4 g/dL — AB (ref 3.5–5.0)
ALT: 9 U/L (ref 0–55)
ANION GAP: 6 (ref 3–11)
AST: 12 U/L (ref 5–34)
Alkaline Phosphatase: 102 U/L (ref 40–150)
BUN: 14 mg/dL (ref 7–26)
CO2: 36 mmol/L — AB (ref 22–29)
Calcium: 9.4 mg/dL (ref 8.4–10.4)
Chloride: 99 mmol/L (ref 98–109)
Creatinine: 0.73 mg/dL (ref 0.70–1.30)
GFR, Est AFR Am: >60 mL/min (ref >60)
GFR, Estimated: >60 mL/min (ref >60)
GLUCOSE: 79 mg/dL (ref 70–140)
POTASSIUM: 3.6 mmol/L (ref 3.5–5.1)
Sodium: 141 mmol/L (ref 136–145)
Total Bilirubin: <0.2 mg/dL — ABNORMAL LOW (ref 0.2–1.2)
Total Protein: 6.1 g/dL — ABNORMAL LOW (ref 6.4–8.3)

## 2018-01-19 NOTE — Telephone Encounter (Signed)
Reviewed cbc/diff with [facility] and pt. Per Julien Nordmann no transfusion tomorrow and hemoccult kit ordered. Pt instructed on hemoccult stool collection and no transfusion tomorrow.  Pt states he has played pool twice last week and has been off his oxygen for a short period of time when he is sitting and breathing has been okay. Appts cancelled for tomorrow.

## 2018-01-19 NOTE — Telephone Encounter (Signed)
Scheduled appts per 1/28 sch msg - blood was already scheduled and I added a lab appt. Left voicemail regarding appts.

## 2018-01-20 ENCOUNTER — Other Ambulatory Visit: Payer: Medicare Other

## 2018-01-20 ENCOUNTER — Encounter (HOSPITAL_COMMUNITY): Payer: Self-pay

## 2018-01-21 ENCOUNTER — Other Ambulatory Visit: Payer: Medicare Other

## 2018-01-26 ENCOUNTER — Other Ambulatory Visit: Payer: Self-pay | Admitting: Medical Oncology

## 2018-01-26 ENCOUNTER — Inpatient Hospital Stay: Payer: Medicare Other | Attending: Internal Medicine

## 2018-01-26 DIAGNOSIS — J439 Emphysema, unspecified: Secondary | ICD-10-CM | POA: Insufficient documentation

## 2018-01-26 DIAGNOSIS — C3492 Malignant neoplasm of unspecified part of left bronchus or lung: Secondary | ICD-10-CM | POA: Diagnosis present

## 2018-01-26 DIAGNOSIS — I110 Hypertensive heart disease with heart failure: Secondary | ICD-10-CM | POA: Diagnosis not present

## 2018-01-26 DIAGNOSIS — F419 Anxiety disorder, unspecified: Secondary | ICD-10-CM | POA: Diagnosis not present

## 2018-01-26 DIAGNOSIS — D61818 Other pancytopenia: Secondary | ICD-10-CM | POA: Insufficient documentation

## 2018-01-26 DIAGNOSIS — D638 Anemia in other chronic diseases classified elsewhere: Secondary | ICD-10-CM | POA: Diagnosis not present

## 2018-01-26 DIAGNOSIS — Z79899 Other long term (current) drug therapy: Secondary | ICD-10-CM | POA: Diagnosis not present

## 2018-01-26 DIAGNOSIS — D6481 Anemia due to antineoplastic chemotherapy: Secondary | ICD-10-CM

## 2018-01-26 DIAGNOSIS — I272 Pulmonary hypertension, unspecified: Secondary | ICD-10-CM | POA: Diagnosis not present

## 2018-01-26 DIAGNOSIS — I509 Heart failure, unspecified: Secondary | ICD-10-CM | POA: Insufficient documentation

## 2018-01-26 DIAGNOSIS — J90 Pleural effusion, not elsewhere classified: Secondary | ICD-10-CM | POA: Insufficient documentation

## 2018-01-26 DIAGNOSIS — F329 Major depressive disorder, single episode, unspecified: Secondary | ICD-10-CM | POA: Insufficient documentation

## 2018-01-26 DIAGNOSIS — C7951 Secondary malignant neoplasm of bone: Secondary | ICD-10-CM | POA: Insufficient documentation

## 2018-01-26 DIAGNOSIS — K7689 Other specified diseases of liver: Secondary | ICD-10-CM | POA: Insufficient documentation

## 2018-01-26 DIAGNOSIS — Z9981 Dependence on supplemental oxygen: Secondary | ICD-10-CM | POA: Insufficient documentation

## 2018-01-26 DIAGNOSIS — T451X5A Adverse effect of antineoplastic and immunosuppressive drugs, initial encounter: Secondary | ICD-10-CM

## 2018-01-26 LAB — CBC WITH DIFFERENTIAL (CANCER CENTER ONLY)
BASOS ABS: 0 10*3/uL (ref 0.0–0.1)
Basophils Relative: 0 %
Eosinophils Absolute: 0.1 10*3/uL (ref 0.0–0.5)
Eosinophils Relative: 1 %
HEMATOCRIT: 27.8 % — AB (ref 38.4–49.9)
HEMOGLOBIN: 8.8 g/dL — AB (ref 13.0–17.1)
LYMPHS ABS: 1.6 10*3/uL (ref 0.9–3.3)
LYMPHS PCT: 15 %
MCH: 32.2 pg (ref 27.2–33.4)
MCHC: 31.7 g/dL — ABNORMAL LOW (ref 32.0–36.0)
MCV: 101.8 fL — AB (ref 79.3–98.0)
Monocytes Absolute: 0.7 10*3/uL (ref 0.1–0.9)
Monocytes Relative: 7 %
NEUTROS ABS: 8 10*3/uL — AB (ref 1.5–6.5)
NEUTROS PCT: 77 %
PLATELETS: 188 10*3/uL (ref 140–400)
RBC: 2.73 MIL/uL — AB (ref 4.20–5.82)
RDW: 19.6 % — ABNORMAL HIGH (ref 11.0–14.6)
WBC: 10.4 10*3/uL — AB (ref 4.0–10.3)

## 2018-01-26 LAB — CMP (CANCER CENTER ONLY)
ALK PHOS: 101 U/L (ref 40–150)
AST: 13 U/L (ref 5–34)
Albumin: 3.3 g/dL — ABNORMAL LOW (ref 3.5–5.0)
Anion gap: 7 (ref 3–11)
BUN: 11 mg/dL (ref 7–26)
CALCIUM: 9.6 mg/dL (ref 8.4–10.4)
CHLORIDE: 96 mmol/L — AB (ref 98–109)
CO2: 37 mmol/L — ABNORMAL HIGH (ref 22–29)
CREATININE: 0.8 mg/dL (ref 0.70–1.30)
Glucose, Bld: 105 mg/dL (ref 70–140)
Potassium: 3.8 mmol/L (ref 3.5–5.1)
Sodium: 140 mmol/L (ref 136–145)
Total Bilirubin: 0.3 mg/dL (ref 0.2–1.2)
Total Protein: 6.3 g/dL — ABNORMAL LOW (ref 6.4–8.3)

## 2018-01-26 LAB — SAMPLE TO BLOOD BANK

## 2018-01-30 ENCOUNTER — Ambulatory Visit (HOSPITAL_COMMUNITY)
Admission: RE | Admit: 2018-01-30 | Discharge: 2018-01-30 | Disposition: A | Discharge status: Home / Self Care | Payer: Medicare Other | Source: Ambulatory Visit | Attending: Internal Medicine | Admitting: Internal Medicine

## 2018-01-30 DIAGNOSIS — C7951 Secondary malignant neoplasm of bone: Secondary | ICD-10-CM | POA: Diagnosis not present

## 2018-01-30 DIAGNOSIS — C3492 Malignant neoplasm of unspecified part of left bronchus or lung: Secondary | ICD-10-CM | POA: Insufficient documentation

## 2018-01-30 MED ORDER — IOPAMIDOL (ISOVUE-300) INJECTION 61%
100.0000 mL | Freq: Once | INTRAVENOUS | Status: AC | PRN
  Administered 2018-01-30: 100 mL via INTRAVENOUS

## 2018-01-30 MED ORDER — IOPAMIDOL (ISOVUE-300) INJECTION 61%
INTRAVENOUS | Status: AC
  Filled 2018-01-30: qty 100

## 2018-02-03 ENCOUNTER — Encounter: Payer: Self-pay | Admitting: Internal Medicine

## 2018-02-03 ENCOUNTER — Telehealth: Payer: Self-pay | Admitting: Internal Medicine

## 2018-02-03 ENCOUNTER — Inpatient Hospital Stay (HOSPITAL_BASED_OUTPATIENT_CLINIC_OR_DEPARTMENT_OTHER): Payer: Medicare Other | Admitting: Internal Medicine

## 2018-02-03 VITALS — BP 91/50 | HR 83 | Temp 97.6°F | Resp 16 | Ht 67.75 in | Wt 118.1 lb

## 2018-02-03 DIAGNOSIS — D61818 Other pancytopenia: Secondary | ICD-10-CM

## 2018-02-03 DIAGNOSIS — R64 Cachexia: Secondary | ICD-10-CM

## 2018-02-03 DIAGNOSIS — C7951 Secondary malignant neoplasm of bone: Secondary | ICD-10-CM

## 2018-02-03 DIAGNOSIS — C3492 Malignant neoplasm of unspecified part of left bronchus or lung: Secondary | ICD-10-CM | POA: Diagnosis not present

## 2018-02-03 DIAGNOSIS — Z79899 Other long term (current) drug therapy: Secondary | ICD-10-CM

## 2018-02-03 DIAGNOSIS — J449 Chronic obstructive pulmonary disease, unspecified: Secondary | ICD-10-CM

## 2018-02-03 DIAGNOSIS — I10 Essential (primary) hypertension: Secondary | ICD-10-CM

## 2018-02-03 DIAGNOSIS — D638 Anemia in other chronic diseases classified elsewhere: Secondary | ICD-10-CM | POA: Diagnosis not present

## 2018-02-03 DIAGNOSIS — D6481 Anemia due to antineoplastic chemotherapy: Secondary | ICD-10-CM

## 2018-02-03 DIAGNOSIS — T451X5A Adverse effect of antineoplastic and immunosuppressive drugs, initial encounter: Secondary | ICD-10-CM

## 2018-02-03 NOTE — Telephone Encounter (Signed)
Scheduled appt per 2/12 los - Gave patient AVS and calender per los. Central radiology to contact patient with ct scan

## 2018-02-03 NOTE — Progress Notes (Signed)
[place] [phone]   [phone]) [phone]  OFFICE PROGRESS NOTE  [name], PA-C [address] [place] [address]  DIAGNOSIS: Extensive stage (T3, N3, M1c) small cell lung cancer presented with widespread malignancy including nodal stations, bones, left pleural space and lungs diagnosed in September 2018.  PRIOR THERAPY: None  CURRENT THERAPY: Systemic chemotherapy with carboplatin for AUC 5 on day 1 and etoposide 100 mg/M2 on days 1, 2 and 3 with Neulasta support. Status post 6 cycles.  INTERVAL HISTORY: Jose Mckinney 72 y.o. male returns to the clinic today for follow-up visit accompanied by his wife and son.  The patient is feeling fine today except for the fatigue and the baseline shortness of breath.  He is currently on home oxygen.  He also has intermittent low back pain.  He denied having any recent weight loss or night sweats.  He has no nausea, vomiting, diarrhea or constipation.  He tolerated his last cycle of chemotherapy fairly well.  The patient had repeat CT scan of the chest, abdomen and pelvis performed recently and he is here for evaluation and discussion of his discuss results.   MEDICAL HISTORY: Past Medical History:  Diagnosis Date  . Anxiety   . CHF (congestive heart failure) (HCC)    x 3 episodes- [place]. pt. living here since 11-'1-16  . COPD with emphysema ([place])   . Depression   . Dyspnea   . HTN (hypertension)   . On home oxygen therapy   . Pulmonary HTN (HCC)    Dr. Marlyn Corporal is following.  . Recurrent left pleural effusion   . Sciatic leg pain    INTERMITTENT- occ. uses Hydrocodone as needed  . Seasonal allergies   . Small cell lung cancer, left ([place]) 09/15/2017    ALLERGIES:  has No Known Allergies.  MEDICATIONS:  Current Outpatient Medications  Medication Sig Dispense Refill  . albuterol (PROVENTIL) (2.5 MG/3ML) 0.083% nebulizer solution Take 3 mLs (2.5 mg total) by nebulization  every 6 (six) hours as needed for wheezing or shortness of breath. 75 mL 12  . dronabinol (MARINOL) 2.5 MG capsule Take 1 capsule (2.5 mg total) by mouth 2 (two) times daily before a meal. 60 capsule 0  . feeding supplement (BOOST HIGH PROTEIN) LIQD Take 1 Container by mouth daily.     . fluticasone (FLONASE) 50 MCG/ACT nasal spray Place 2 sprays into both nostrils daily as needed for allergies or rhinitis.    Marland Kitchen ibuprofen (ADVIL,MOTRIN) 200 MG tablet Take 600 mg by mouth 3 (three) times daily as needed (pain).     Marland Kitchen ipratropium (ATROVENT) 0.02 % nebulizer solution Take 0.5 mg by nebulization every 4 (four) hours as needed for wheezing or shortness of breath.    . loratadine (CLARITIN) 10 MG tablet Take 10 mg by mouth daily as needed for allergies.    Marland Kitchen prochlorperazine (COMPAZINE) 10 MG tablet Take 1 tablet (10 mg total) by mouth every 6 (six) hours as needed for nausea or vomiting. 30 tablet 1  . sertraline (ZOLOFT) 50 MG tablet Take 50 mg by mouth 2 (two) times daily.    . sildenafil (REVATIO) 20 MG tablet Take 20 mg by mouth as directed.    . torsemide (DEMADEX) 20 MG tablet Take 1 tablet (20 mg total) by mouth daily as needed. 30 tablet 6   No current facility-administered medications for this visit.     SURGICAL HISTORY:  Past Surgical History:  Procedure Laterality Date  . ANGIOPLASTY    . CARDIAC CATHETERIZATION     '15- [place]- no issues now.  . COLONOSCOPY WITH PROPOFOL N/A 02/09/2016   Procedure: COLONOSCOPY WITH PROPOFOL;  Surgeon: Carol Ada, MD;  Location: [facility];  Service: Endoscopy;  Laterality: N/A;  . IR THORACENTESIS ASP PLEURAL SPACE W/IMG GUIDE  08/08/2017  . MASTOIDECTOMY Left   . TONSILLECTOMY      REVIEW OF SYSTEMS:  Constitutional: positive for fatigue Eyes: negative Ears, nose, mouth, throat, and face: negative Respiratory: positive for dyspnea on exertion Cardiovascular: negative Gastrointestinal:  negative Genitourinary:negative Integument/breast: negative Hematologic/lymphatic: negative Musculoskeletal:positive for muscle weakness Neurological: negative Behavioral/Psych: negative Endocrine: negative Allergic/Immunologic: negative   PHYSICAL EXAMINATION: General appearance: alert, cooperative, fatigued and no distress Head: Normocephalic, without obvious abnormality, atraumatic Neck: no adenopathy, no JVD, supple, symmetrical, trachea midline and thyroid not enlarged, symmetric, no tenderness/mass/nodules Lymph nodes: Cervical, supraclavicular, and axillary nodes normal. Resp: clear to auscultation bilaterally Back: symmetric, no curvature. ROM normal. No CVA tenderness. Cardio: regular rate and rhythm, S1, S2 normal, no murmur, click, rub or gallop GI: soft, non-tender; bowel sounds normal; no masses,  no organomegaly Extremities: extremities normal, atraumatic, no cyanosis or edema Neurologic: Alert and oriented X 3, normal strength and tone. Normal symmetric reflexes. Normal coordination and gait  ECOG PERFORMANCE STATUS: 1 - Symptomatic but completely ambulatory  Blood pressure (!) 91/50, pulse 83, temperature 97.6 F (36.4 C), temperature source Oral, resp. rate 16, height 5' 7.75" (1.721 m), weight 118 lb 1.6 oz (53.6 kg), SpO2 97 %.  LABORATORY DATA: Lab Results  Component Value Date   WBC 10.4 (H) 01/26/2018   HGB 7.5 (L) 01/12/2018   HCT 27.8 (L) 01/26/2018   MCV 101.8 (H) 01/26/2018   PLT 188 01/26/2018      Chemistry      Component Value Date/Time   NA 140 01/26/2018 1038   NA 138 12/26/2017 1118   K 3.8 01/26/2018 1038   K 3.8 12/26/2017 1118   CL 96 (L) 01/26/2018 1038   CO2 37 (H) 01/26/2018 1038   CO2 36 (H) 12/26/2017 1118   BUN 11 01/26/2018 1038   BUN 16.5 12/26/2017 1118   CREATININE 0.80 01/26/2018 1038   CREATININE 0.7 12/26/2017 1118      Component Value Date/Time   CALCIUM 9.6 01/26/2018 1038   CALCIUM 9.5 12/26/2017 1118   ALKPHOS  101 01/26/2018 1038   ALKPHOS 106 12/26/2017 1118   AST 13 01/26/2018 1038   AST 15 12/26/2017 1118   ALT <6 01/26/2018 1038   ALT 13 12/26/2017 1118   BILITOT 0.3 01/26/2018 1038   BILITOT 0.52 12/26/2017 1118       RADIOGRAPHIC STUDIES: Ct Chest W Contrast  Result Date: 01/30/2018 CLINICAL DATA:  Small cell lung cancer. Chemotherapy ongoing. Subsequent treatment evaluation. EXAM: CT CHEST, ABDOMEN, AND PELVIS WITH CONTRAST TECHNIQUE: Multidetector CT imaging of the chest, abdomen and pelvis was performed following the standard protocol during bolus administration of intravenous contrast. CONTRAST:  150mL ISOVUE-300 IOPAMIDOL (ISOVUE-300) INJECTION 61% COMPARISON:  12/11/2017 FINDINGS: CT CHEST FINDINGS Cardiovascular: Coronary artery calcification and aortic atherosclerotic calcification. Mediastinum/Nodes: No axillary or supraclavicular adenopathy. No mediastinal hilar adenopathy. No fluid. Esophagus normal. Lungs/Pleura: Peripheral nodule in the RIGHT upper lobe (image 1, series 4) is unchanged. Pleural parenchymal thickening in the posterior RIGHT upper lobe (image 47, series 4) is unchanged. Band of pleural thickening along the RIGHT oblique fissure is increased but appears benign (image 72),. small RIGHT  effusion. Again demonstrated calcified pleural surface. There is volume loss in the LEFT hemithorax. There is a partially loculated fluid collection at the LEFT lung base. Associated lower lobe atelectasis. Pleural pleural calcification consistent fibrothorax. One lateral portion of rounded consolidation in the LEFT lower lobe measures 2.5 cm (image 81, series 6 compared to 2.7 cm for no interval change Musculoskeletal: Again demonstrated lytic and sclerotic lesions within the thoracic and lumbar spine not changed. No new lesions the C7 cervical lesion also demonstrated CT ABDOMEN AND PELVIS FINDINGS Hepatic: No biliary duct dilatation.  Small hepatic cyst. Pancreas: Small cysts in pancreatic  head 9 mm stable. No pancreatic duct dilatation Spleen: Normal spleen Adrenals/urinary tract: Stable LEFT adrenal adenoma. Normal RIGHT adrenal gland. Normal kidneys, ureters and bladder Stomach/Bowel: Stomach, small bowel, appendix, and cecum are normal. The colon and rectosigmoid colon are normal. Vascular/Lymphatic: Abdominal aorta is normal caliber with atherosclerotic calcification. There is no retroperitoneal or periportal lymphadenopathy. No pelvic lymphadenopathy. Reproductive: Prostate normal Other: No peritoneal or omental metastasis. Musculoskeletal: Stable rounded sclerotic lesions in the pelvis and spine. IMPRESSION: Chest Impression: 1. No evidence of metastatic progression in thorax. 2. 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 No interval change. 3. New new pulmonary nodularity. 4. No mediastinal adenopathy. Abdomen / Pelvis Impression: 1. No evidence of soft tissue metastasis in the abdomen pelvis. 2. 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 Electronically Signed   By: Suzy Bouchard M.D.   On: 01/30/2018 16:10   Ct Abdomen Pelvis W Contrast  Result Date: 01/30/2018 CLINICAL DATA:  Small cell lung cancer. Chemotherapy ongoing. Subsequent treatment evaluation. EXAM: CT CHEST, ABDOMEN, AND PELVIS WITH CONTRAST TECHNIQUE: Multidetector CT imaging of the chest, abdomen and pelvis was performed following the standard protocol during bolus administration of intravenous contrast. CONTRAST:  149mL ISOVUE-300 IOPAMIDOL (ISOVUE-300) INJECTION 61% COMPARISON:  12/11/2017 FINDINGS: CT CHEST FINDINGS Cardiovascular: Coronary artery calcification and aortic atherosclerotic calcification. Mediastinum/Nodes: No axillary or supraclavicular adenopathy. No mediastinal hilar adenopathy. No fluid. Esophagus normal. Lungs/Pleura: Peripheral nodule in the RIGHT upper lobe (image 1, series 4) is unchanged. Pleural parenchymal thickening in the posterior RIGHT  upper lobe (image 47, series 4) is unchanged. Band of pleural thickening along the RIGHT oblique fissure is increased but appears benign (image 72),. small RIGHT effusion. Again demonstrated calcified pleural surface. There is volume loss in the LEFT hemithorax. There is a partially loculated fluid collection at the LEFT lung base. Associated lower lobe atelectasis. Pleural pleural calcification consistent fibrothorax. One lateral portion of rounded consolidation in the LEFT lower lobe measures 2.5 cm (image 81, series 6 compared to 2.7 cm for no interval change Musculoskeletal: Again demonstrated lytic and sclerotic lesions within the thoracic and lumbar spine not changed. No new lesions the C7 cervical lesion also demonstrated CT ABDOMEN AND PELVIS FINDINGS Hepatic: No biliary duct dilatation.  Small hepatic cyst. Pancreas: Small cysts in pancreatic head 9 mm stable. No pancreatic duct dilatation Spleen: Normal spleen Adrenals/urinary tract: Stable LEFT adrenal adenoma. Normal RIGHT adrenal gland. Normal kidneys, ureters and bladder Stomach/Bowel: Stomach, small bowel, appendix, and cecum are normal. The colon and rectosigmoid colon are normal. Vascular/Lymphatic: Abdominal aorta is normal caliber with atherosclerotic calcification. There is no retroperitoneal or periportal lymphadenopathy. No pelvic lymphadenopathy. Reproductive: Prostate normal Other: No peritoneal or omental metastasis. Musculoskeletal: Stable rounded sclerotic lesions in the pelvis and spine. IMPRESSION: Chest Impression: 1. No evidence of metastatic progression in thorax. 2. 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 No interval change. 3. New new pulmonary nodularity. 4. No mediastinal adenopathy. Abdomen / Pelvis Impression: 1. No evidence of soft tissue metastasis in the abdomen pelvis. 2. 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 Electronically Signed   By: Suzy Bouchard M.D.    On: 01/30/2018 16:10    ASSESSMENT AND PLAN: This is a very pleasant 72 years old white male recently diagnosed with extensive stage small cell lung cancer. The patient completed 6 cycles of systemic chemotherapy with carboplatin and etoposide. He tolerated this treatment well except for increasing fatigue and pancytopenia. He had repeat CT scan of the chest, abdomen and pelvis performed recently.  I personally and independently reviewed the scan images and discussed the results with the patient and his family. Has a scan showed no concerning findings for disease progression. I recommended for the patient to continue on observation for now with repeat CT scan of the chest, abdomen and pelvis in 2 months for restaging of his disease. For the anemia of chronic disease, we will continue to monitor his hemoglobin and hematocrit closely and consider the patient for transfusion if needed. The patient voices understanding of current disease status and treatment options and is in agreement with the current care plan. All questions were answered. The patient knows to call the clinic with any problems, questions or concerns. We can certainly see the patient much sooner if necessary. I spent 15 minutes counseling the patient face to face. The total time spent in the appointment was 25 minutes.   Disclaimer: This note was dictated with voice recognition software. Similar sounding words can inadvertently be transcribed and may not be corrected upon review.

## 2018-02-26 ENCOUNTER — Encounter: Payer: Self-pay | Admitting: *Deleted

## 2018-03-26 ENCOUNTER — Telehealth: Payer: Self-pay | Admitting: Medical Oncology

## 2018-03-26 ENCOUNTER — Telehealth: Payer: Self-pay | Admitting: *Deleted

## 2018-03-26 NOTE — Telephone Encounter (Signed)
Returned call per wife's request. LVM to go to ED if having trouble breathing,chest  Pain .

## 2018-03-26 NOTE — Telephone Encounter (Signed)
Late Entry:  Received call earlier from wife stating she would like Dr Julien Nordmann or RN to call her husband & talk with him. She states that he has some chest pain, eating poor, & sleeping a lot & she doesn't know how to help him.  Message give to Abelina Bachelor RN.

## 2018-04-03 ENCOUNTER — Inpatient Hospital Stay: Payer: Medicare Other | Attending: Internal Medicine

## 2018-04-03 ENCOUNTER — Ambulatory Visit (HOSPITAL_COMMUNITY)
Admission: RE | Admit: 2018-04-03 | Discharge: 2018-04-03 | Disposition: A | Discharge status: Home / Self Care | Payer: Medicare Other | Source: Ambulatory Visit | Attending: Internal Medicine | Admitting: Internal Medicine

## 2018-04-03 DIAGNOSIS — C7951 Secondary malignant neoplasm of bone: Secondary | ICD-10-CM | POA: Diagnosis not present

## 2018-04-03 DIAGNOSIS — K5730 Diverticulosis of large intestine without perforation or abscess without bleeding: Secondary | ICD-10-CM | POA: Diagnosis not present

## 2018-04-03 DIAGNOSIS — I110 Hypertensive heart disease with heart failure: Secondary | ICD-10-CM | POA: Diagnosis not present

## 2018-04-03 DIAGNOSIS — C3492 Malignant neoplasm of unspecified part of left bronchus or lung: Secondary | ICD-10-CM

## 2018-04-03 DIAGNOSIS — I272 Pulmonary hypertension, unspecified: Secondary | ICD-10-CM | POA: Insufficient documentation

## 2018-04-03 DIAGNOSIS — K219 Gastro-esophageal reflux disease without esophagitis: Secondary | ICD-10-CM | POA: Insufficient documentation

## 2018-04-03 DIAGNOSIS — K224 Dyskinesia of esophagus: Secondary | ICD-10-CM | POA: Insufficient documentation

## 2018-04-03 DIAGNOSIS — J439 Emphysema, unspecified: Secondary | ICD-10-CM | POA: Insufficient documentation

## 2018-04-03 DIAGNOSIS — I509 Heart failure, unspecified: Secondary | ICD-10-CM | POA: Diagnosis not present

## 2018-04-03 DIAGNOSIS — Z9981 Dependence on supplemental oxygen: Secondary | ICD-10-CM | POA: Diagnosis not present

## 2018-04-03 DIAGNOSIS — C786 Secondary malignant neoplasm of retroperitoneum and peritoneum: Secondary | ICD-10-CM | POA: Insufficient documentation

## 2018-04-03 DIAGNOSIS — Z79899 Other long term (current) drug therapy: Secondary | ICD-10-CM | POA: Insufficient documentation

## 2018-04-03 DIAGNOSIS — I517 Cardiomegaly: Secondary | ICD-10-CM | POA: Insufficient documentation

## 2018-04-03 DIAGNOSIS — F329 Major depressive disorder, single episode, unspecified: Secondary | ICD-10-CM | POA: Insufficient documentation

## 2018-04-03 DIAGNOSIS — K869 Disease of pancreas, unspecified: Secondary | ICD-10-CM | POA: Diagnosis not present

## 2018-04-03 DIAGNOSIS — D638 Anemia in other chronic diseases classified elsewhere: Secondary | ICD-10-CM | POA: Insufficient documentation

## 2018-04-03 DIAGNOSIS — D61818 Other pancytopenia: Secondary | ICD-10-CM | POA: Diagnosis not present

## 2018-04-03 DIAGNOSIS — K7689 Other specified diseases of liver: Secondary | ICD-10-CM | POA: Insufficient documentation

## 2018-04-03 DIAGNOSIS — I2510 Atherosclerotic heart disease of native coronary artery without angina pectoris: Secondary | ICD-10-CM | POA: Insufficient documentation

## 2018-04-03 DIAGNOSIS — Z5111 Encounter for antineoplastic chemotherapy: Secondary | ICD-10-CM | POA: Insufficient documentation

## 2018-04-03 DIAGNOSIS — I700 Atherosclerosis of aorta: Secondary | ICD-10-CM | POA: Diagnosis not present

## 2018-04-03 DIAGNOSIS — D3502 Benign neoplasm of left adrenal gland: Secondary | ICD-10-CM | POA: Insufficient documentation

## 2018-04-03 DIAGNOSIS — F419 Anxiety disorder, unspecified: Secondary | ICD-10-CM | POA: Diagnosis not present

## 2018-04-03 LAB — CBC WITH DIFFERENTIAL (CANCER CENTER ONLY)
BASOS ABS: 0 10*3/uL (ref 0.0–0.1)
BASOS PCT: 0 %
EOS PCT: 1 %
Eosinophils Absolute: 0.1 10*3/uL (ref 0.0–0.5)
HEMATOCRIT: 37.6 % — AB (ref 38.4–49.9)
Hemoglobin: 11.8 g/dL — ABNORMAL LOW (ref 13.0–17.1)
Lymphocytes Relative: 23 %
Lymphs Abs: 1.6 10*3/uL (ref 0.9–3.3)
MCH: 30.1 pg (ref 27.2–33.4)
MCHC: 31.4 g/dL — ABNORMAL LOW (ref 32.0–36.0)
MCV: 95.9 fL (ref 79.3–98.0)
MONO ABS: 0.8 10*3/uL (ref 0.1–0.9)
MONOS PCT: 11 %
NEUTROS ABS: 4.6 10*3/uL (ref 1.5–6.5)
Neutrophils Relative %: 65 %
PLATELETS: 237 10*3/uL (ref 140–400)
RBC: 3.92 MIL/uL — ABNORMAL LOW (ref 4.20–5.82)
RDW: 14.6 % (ref 11.0–14.6)
WBC Count: 7 10*3/uL (ref 4.0–10.3)

## 2018-04-03 LAB — CMP (CANCER CENTER ONLY)
ALBUMIN: 4 g/dL (ref 3.5–5.0)
ALT: 15 U/L (ref 0–55)
AST: 42 U/L — ABNORMAL HIGH (ref 5–34)
Alkaline Phosphatase: 91 U/L (ref 40–150)
Anion gap: 10 (ref 3–11)
BUN: 20 mg/dL (ref 7–26)
CO2: 34 mmol/L — ABNORMAL HIGH (ref 22–29)
Calcium: 10.7 mg/dL — ABNORMAL HIGH (ref 8.4–10.4)
Chloride: 94 mmol/L — ABNORMAL LOW (ref 98–109)
Creatinine: 0.81 mg/dL (ref 0.70–1.30)
GFR, Est AFR Am: >60 mL/min (ref >60)
Glucose, Bld: 113 mg/dL (ref 70–140)
POTASSIUM: 3.6 mmol/L (ref 3.5–5.1)
Sodium: 138 mmol/L (ref 136–145)
TOTAL PROTEIN: 7.5 g/dL (ref 6.4–8.3)

## 2018-04-03 MED ORDER — IOHEXOL 300 MG/ML  SOLN
100.0000 mL | Freq: Once | INTRAMUSCULAR | Status: AC | PRN
  Administered 2018-04-03: 80 mL via INTRAVENOUS

## 2018-04-06 ENCOUNTER — Encounter: Payer: Self-pay | Admitting: Internal Medicine

## 2018-04-06 ENCOUNTER — Inpatient Hospital Stay (HOSPITAL_BASED_OUTPATIENT_CLINIC_OR_DEPARTMENT_OTHER): Payer: Medicare Other | Admitting: Internal Medicine

## 2018-04-06 ENCOUNTER — Other Ambulatory Visit: Payer: Self-pay | Admitting: Radiology

## 2018-04-06 VITALS — BP 95/47 | HR 122 | Temp 98.5°F | Resp 18 | Ht 67.75 in | Wt 108.1 lb

## 2018-04-06 DIAGNOSIS — D61818 Other pancytopenia: Secondary | ICD-10-CM

## 2018-04-06 DIAGNOSIS — C3492 Malignant neoplasm of unspecified part of left bronchus or lung: Secondary | ICD-10-CM | POA: Diagnosis not present

## 2018-04-06 DIAGNOSIS — C786 Secondary malignant neoplasm of retroperitoneum and peritoneum: Secondary | ICD-10-CM | POA: Diagnosis not present

## 2018-04-06 DIAGNOSIS — Z5111 Encounter for antineoplastic chemotherapy: Secondary | ICD-10-CM | POA: Diagnosis not present

## 2018-04-06 DIAGNOSIS — C7951 Secondary malignant neoplasm of bone: Secondary | ICD-10-CM

## 2018-04-06 DIAGNOSIS — D6481 Anemia due to antineoplastic chemotherapy: Secondary | ICD-10-CM

## 2018-04-06 DIAGNOSIS — D638 Anemia in other chronic diseases classified elsewhere: Secondary | ICD-10-CM | POA: Diagnosis not present

## 2018-04-06 DIAGNOSIS — T451X5A Adverse effect of antineoplastic and immunosuppressive drugs, initial encounter: Secondary | ICD-10-CM

## 2018-04-06 DIAGNOSIS — Z7189 Other specified counseling: Secondary | ICD-10-CM

## 2018-04-06 DIAGNOSIS — E46 Unspecified protein-calorie malnutrition: Secondary | ICD-10-CM

## 2018-04-06 DIAGNOSIS — Z79899 Other long term (current) drug therapy: Secondary | ICD-10-CM | POA: Diagnosis not present

## 2018-04-06 MED ORDER — LIDOCAINE-PRILOCAINE 2.5-2.5 % EX CREA: 1.0000 "application " | TOPICAL_CREAM | CUTANEOUS | 0 refills | Status: AC | PRN

## 2018-04-06 NOTE — Progress Notes (Signed)
DISCONTINUE ON PATHWAY REGIMEN - Small Cell Lung     A cycle is every 21 days:     Etoposide      Carboplatin   **Always confirm dose/schedule in your pharmacy ordering system**    REASON: Disease Progression PRIOR TREATMENT: [id number]: Etoposide 100 mg/m2 Days 1, 2, 3 + Carboplatin AUC=5 Day 1 q21 Days x 4 Cycles TREATMENT RESPONSE: Partial Response (PR)  START ON PATHWAY REGIMEN - Small Cell Lung     Administer weekly:     Topotecan   **Always confirm dose/schedule in your pharmacy ordering system**    Patient Characteristics: Extensive and Limited Stage, Second Line, Relapse < 3 Months Stage Classification: Extensive AJCC T Category: T2a AJCC N Category: N3 AJCC M Category: M1c AJCC 8 Stage Grouping: IVB Line of therapy: Second Line Time to Relapse: Relapse < 3 Months Intent of Therapy: Non-Curative / Palliative Intent, Discussed with Patient

## 2018-04-06 NOTE — Progress Notes (Signed)
[place] [phone]   [phone]) [phone]  OFFICE PROGRESS NOTE  [name], PA-C [address] [place] [address]  DIAGNOSIS: Extensive stage (T3, N3, M1c) small cell lung cancer presented with widespread malignancy including nodal stations, bones, left pleural space and lungs diagnosed in September 2018.  PRIOR THERAPY: Systemic chemotherapy with carboplatin for AUC 5 on day 1 and etoposide 100 mg/M2 on days 1, 2 and 3 with Neulasta support. Status post 6 cycles with partial response.   CURRENT THERAPY: Second line chemotherapy with reduced dose weekly Topotecan 2.0 mg/M2.  First dose April 13, 2018  INTERVAL HISTORY: Jose Mckinney 72 y.o. male returns to the clinic today for follow-up visit accompanied by his wife and son.  The patient is complaining in for increasing fatigue and weakness as well as soreness all over his body.  He has a hard time raising his right arm.  He denied having any cough or hemoptysis but he continues to have chest pain and shortness of breath with minimal exertion.  He has lack of appetite.  He denied having any fever or chills.  He has no nausea, vomiting, diarrhea or constipation.  He has been in observation for the last 2 months.  He had repeat CT scan of the chest, abdomen and pelvis performed recently and he is here for evaluation and discussion of his discuss results and treatment options.   MEDICAL HISTORY: Past Medical History:  Diagnosis Date  . Anxiety   . CHF (congestive heart failure) (HCC)    x 3 episodes- [place]. pt. living here since 11-'1-16  . 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 Depression   . Dyspnea   . HTN (hypertension)   . On home oxygen therapy   . Pulmonary HTN (HCC)    Dr. Marlyn Corporal is following.  . Recurrent left pleural effusion   . Sciatic leg pain    INTERMITTENT- occ. uses Hydrocodone as needed  . Seasonal allergies   . Small cell lung cancer, left ([place])  09/15/2017    ALLERGIES:  has No Known Allergies.  MEDICATIONS:  Current Outpatient Medications  Medication Sig Dispense Refill  . albuterol (PROVENTIL) (2.5 MG/3ML) 0.083% nebulizer solution Take 3 mLs (2.5 mg total) by nebulization every 6 (six) hours as needed for wheezing or shortness of breath. 75 mL 12  . dronabinol (MARINOL) 2.5 MG capsule Take 1 capsule (2.5 mg total) by mouth 2 (two) times daily before a meal. 60 capsule 0  . feeding supplement (BOOST HIGH PROTEIN) LIQD Take 1 Container by mouth daily.     . fluticasone (FLONASE) 50 MCG/ACT nasal spray Place 2 sprays into both nostrils daily as needed for allergies or rhinitis.    Marland Kitchen ibuprofen (ADVIL,MOTRIN) 200 MG tablet Take 600 mg by mouth 3 (three) times daily as needed (pain).     Marland Kitchen ipratropium (ATROVENT) 0.02 % nebulizer solution Take 0.5 mg by nebulization every 4 (four) hours as needed for wheezing or shortness of breath.    . loratadine (CLARITIN) 10 MG tablet Take 10 mg by mouth daily as needed for allergies.    Marland Kitchen prochlorperazine (COMPAZINE) 10 MG tablet Take 1 tablet (10 mg total) by mouth every 6 (six) hours as needed for nausea or vomiting. 30 tablet 1  . sertraline (ZOLOFT) 50 MG tablet Take 50 mg by mouth 2 (two) times daily.    . sildenafil (REVATIO) 20 MG tablet Take 20 mg by mouth  as directed.    . torsemide (DEMADEX) 20 MG tablet Take 1 tablet (20 mg total) by mouth daily as needed. 30 tablet 6   No current facility-administered medications for this visit.     SURGICAL HISTORY:  Past Surgical History:  Procedure Laterality Date  . ANGIOPLASTY    . CARDIAC CATHETERIZATION     '15- [place]- no issues now.  . COLONOSCOPY WITH PROPOFOL N/A 02/09/2016   Procedure: COLONOSCOPY WITH PROPOFOL;  Surgeon: Carol Ada, MD;  Location: [facility];  Service: Endoscopy;  Laterality: N/A;  . IR THORACENTESIS ASP PLEURAL SPACE W/IMG GUIDE  08/08/2017  . MASTOIDECTOMY Left   . TONSILLECTOMY      REVIEW OF  SYSTEMS:  Constitutional: positive for anorexia, fatigue and weight loss Eyes: negative Ears, nose, mouth, throat, and face: negative Respiratory: positive for dyspnea on exertion Cardiovascular: negative Gastrointestinal: negative Genitourinary:negative Integument/breast: negative Hematologic/lymphatic: negative Musculoskeletal:positive for bone pain and muscle weakness Neurological: negative Behavioral/Psych: negative Endocrine: negative Allergic/Immunologic: negative   PHYSICAL EXAMINATION: General appearance: alert, cooperative, fatigued and no distress Head: Normocephalic, without obvious abnormality, atraumatic Neck: no adenopathy, no JVD, supple, symmetrical, trachea midline and thyroid not enlarged, symmetric, no tenderness/mass/nodules Lymph nodes: Cervical, supraclavicular, and axillary nodes normal. Resp: clear to auscultation bilaterally Back: symmetric, no curvature. ROM normal. No CVA tenderness. Cardio: regular rate and rhythm, S1, S2 normal, no murmur, click, rub or gallop GI: soft, non-tender; bowel sounds normal; no masses,  no organomegaly Extremities: extremities normal, atraumatic, no cyanosis or edema Neurologic: Alert and oriented X 3, normal strength and tone. Normal symmetric reflexes. Normal coordination and gait  ECOG PERFORMANCE STATUS: 1 - Symptomatic but completely ambulatory  Blood pressure (!) 95/47, pulse (!) 122, temperature 98.5 F (36.9 C), temperature source Oral, resp. rate 18, height 5' 7.75" (1.721 m), weight 108 lb 1.6 oz (49 kg), SpO2 94 %.  LABORATORY DATA: Lab Results  Component Value Date   WBC 7.0 04/03/2018   HGB 7.5 (L) 01/12/2018   HCT 37.6 (L) 04/03/2018   MCV 95.9 04/03/2018   PLT 237 04/03/2018      Chemistry      Component Value Date/Time   NA 138 04/03/2018 0935   NA 138 12/26/2017 1118   K 3.6 04/03/2018 0935   K 3.8 12/26/2017 1118   CL 94 (L) 04/03/2018 0935   CO2 34 (H) 04/03/2018 0935   CO2 36 (H) 12/26/2017  1118   BUN 20 04/03/2018 0935   BUN 16.5 12/26/2017 1118   CREATININE 0.81 04/03/2018 0935   CREATININE 0.7 12/26/2017 1118      Component Value Date/Time   CALCIUM 10.7 (H) 04/03/2018 0935   CALCIUM 9.5 12/26/2017 1118   ALKPHOS 91 04/03/2018 0935   ALKPHOS 106 12/26/2017 1118   AST 42 (H) 04/03/2018 0935   AST 15 12/26/2017 1118   ALT 15 04/03/2018 0935   ALT 13 12/26/2017 1118   BILITOT <0.2 (L) 04/03/2018 0935   BILITOT 0.52 12/26/2017 1118       RADIOGRAPHIC STUDIES: Ct Chest W Contrast  Result Date: 04/03/2018 CLINICAL DATA:  Ongoing chemotherapy for left lung cancer. Chronic shortness of breath. Restaging. EXAM: CT CHEST, ABDOMEN, AND PELVIS WITH CONTRAST TECHNIQUE: Multidetector CT imaging of the chest, abdomen and pelvis was performed following the standard protocol during bolus administration of intravenous contrast. CONTRAST:  65mL OMNIPAQUE IOHEXOL 300 MG/ML  SOLN COMPARISON:  01/30/2018 FINDINGS: CT CHEST FINDINGS Cardiovascular: Coronary, aortic arch, and branch vessel atherosclerotic vascular disease. Mild cardiomegaly.  Main pulmonary artery 3.9 cm in diameter. Mediastinum/Nodes: Contrast medium throughout the thoracic esophagus compatible with dysmotility or reflux. Prevascular node 1.1 cm in short axis, previously 0.3 cm. Separate prevascular lymph node 0.8 cm in short axis on image 28/2, previously 0.3 cm. Posterior mediastinal/paraspinal tumor to the left of the L3 vertebral body measures 1.1 by 2.2 cm, minimal nodularity previously in this vicinity at 0.9 by 0.6 cm. Posterior mediastinal and paraspinal tumor to the left of the L4 vertebral body measures 3.1 by 1.9 cm, previously about 5 mm in thickness. There is also progressive pleural nodularity posterior to the descending thoracic aorta suspicious for potential tumor deposits. Small bilateral axillary lymph nodes. Lungs/Pleura: Bilateral pleural thickening and chronic left pleural effusion, with calcifications along the  pleural thickening bilaterally, previously attributed to fiber thoraces. Similar appearance of peripheral consolidation and rounded density along the left upper lobe and left lower lobe attributed to rounded atelectasis. Prominent centrilobular emphysema sub solid right upper lobe nodule 1.2 by 0.6 cm on image 44/6, previously the same size but previously slightly more solid-appearing. Stable 5 mm right upper lobe nodule anteriorly on image 55/6. Additional stable mild scattered nodularity in the right upper lobe and right middle lobe. A right lower lobe lesion measuring 1.4 by 0.8 cm on image 109/6 was previously 1.1 by 0.7 cm. Musculoskeletal: Bilateral rib metastatic lesions are mostly stable, aside from the left posterior seventh rib metastatic lesion which appears more moth-eaten than it did previously. Right scapular and clavicular metastatic lesions appear stable as does the mid sternal lesion. The T10 metastatic lesion has some mild associated posterior cortical discontinuity and possible early epidural extension of tumor. CT ABDOMEN PELVIS FINDINGS Hepatobiliary: Stable small cyst in segment IVb of the liver. No new liver lesion is identified. Pancreas: 1.1 by 0.7 cm cystic lesion in the head of the pancreas appears similar to the prior CT examination. Connectivity to the dorsal pancreatic duct is uncertain but possible. Spleen: Unremarkable Adrenals/Urinary Tract: There is a stable left adrenal gland lateral limb adenoma, but there is a new right adrenal 1.2 by 1.3 cm mass on image 64/2 suspicious for a metastatic lesion. Right adrenal gland unremarkable. Vascular calcifications in the renal hila. No hydronephrosis or hydroureter. Stomach/Bowel: Sigmoid colon diverticulosis. Oral contrast medium extends through to the descending colon. No appreciable dilated bowel. Vascular/Lymphatic: Aortoiliac atherosclerotic vascular disease. There is new and progressive retroperitoneal adenopathy. Left periaortic lymph  node at the level of the renal artery on image 66/2 measures 1.5 cm in short axis. Left periaortic lymph node below this level on image 71/2 measures 2.0 cm in short axis. Reproductive: Unremarkable Other: Paucity of intra-abdominal adipose tissue. Musculoskeletal: There is a greater degree of lucency associated with the metastatic lesion in the L1 vertebral body eccentric to the right on image 62/2. This lesion also appears to extend through the posterior cortex and into the anterior epidural space on image 62/2, which is new. The metastatic lesions at L3 have enlarged and ground more confluent, with new posterior bony destruction of the L3 vertebral body and epidural extension of tumor on image 76/2. IMPRESSION: 1. Progressive malignancy, with new prevascular and retroperitoneal adenopathy; progression of osseous metastatic disease with suspicion for potential epidural tumor involvement now at T10, L1, and L3; new left thoracic paraspinal tumor deposits; a suspected new metastatic lesion to the medial limb of the left adrenal gland, and mild enlargement of a right lower lobe pulmonary nodule currently 1.4 by 0.8 cm. Increased moth-eaten appearance  of the metastatic lesion in the left seventh rib. The remainder of the bony lesions appear stable. 2. 1.1 by 0.7 cm cystic lesion in the head of the pancreas. This could possibly be a small intraductal papillary mucinous neoplasm or a postinflammatory cystic lesion. I would suggest surveillance of this lesion in the context of the patient's lung cancer imaging. 3. Other imaging findings of potential clinical significance: Aortic Atherosclerosis (ICD10-I70.0) and Emphysema (ICD10-J43.9). Coronary atherosclerosis with mild cardiomegaly. Mildly prominent pulmonary artery likely from secondary pulmonary arterial hypertension. Esophageal dysmotility or reflux. Chronic bilateral fibrothorax with suspected rounded atelectasis in the lingula and left lower lobe. 4. Left adrenal  adenoma. 5. Sigmoid colon diverticulosis. Electronically Signed   By: Van Clines M.D.   On: 04/03/2018 15:25   Ct Abdomen Pelvis W Contrast  Result Date: 04/03/2018 CLINICAL DATA:  Ongoing chemotherapy for left lung cancer. Chronic shortness of breath. Restaging. EXAM: CT CHEST, ABDOMEN, AND PELVIS WITH CONTRAST TECHNIQUE: Multidetector CT imaging of the chest, abdomen and pelvis was performed following the standard protocol during bolus administration of intravenous contrast. CONTRAST:  7mL OMNIPAQUE IOHEXOL 300 MG/ML  SOLN COMPARISON:  01/30/2018 FINDINGS: CT CHEST FINDINGS Cardiovascular: Coronary, aortic arch, and branch vessel atherosclerotic vascular disease. Mild cardiomegaly. Main pulmonary artery 3.9 cm in diameter. Mediastinum/Nodes: Contrast medium throughout the thoracic esophagus compatible with dysmotility or reflux. Prevascular node 1.1 cm in short axis, previously 0.3 cm. Separate prevascular lymph node 0.8 cm in short axis on image 28/2, previously 0.3 cm. Posterior mediastinal/paraspinal tumor to the left of the L3 vertebral body measures 1.1 by 2.2 cm, minimal nodularity previously in this vicinity at 0.9 by 0.6 cm. Posterior mediastinal and paraspinal tumor to the left of the L4 vertebral body measures 3.1 by 1.9 cm, previously about 5 mm in thickness. There is also progressive pleural nodularity posterior to the descending thoracic aorta suspicious for potential tumor deposits. Small bilateral axillary lymph nodes. Lungs/Pleura: Bilateral pleural thickening and chronic left pleural effusion, with calcifications along the pleural thickening bilaterally, previously attributed to fiber thoraces. Similar appearance of peripheral consolidation and rounded density along the left upper lobe and left lower lobe attributed to rounded atelectasis. Prominent centrilobular emphysema sub solid right upper lobe nodule 1.2 by 0.6 cm on image 44/6, previously the same size but previously slightly  more solid-appearing. Stable 5 mm right upper lobe nodule anteriorly on image 55/6. Additional stable mild scattered nodularity in the right upper lobe and right middle lobe. A right lower lobe lesion measuring 1.4 by 0.8 cm on image 109/6 was previously 1.1 by 0.7 cm. Musculoskeletal: Bilateral rib metastatic lesions are mostly stable, aside from the left posterior seventh rib metastatic lesion which appears more moth-eaten than it did previously. Right scapular and clavicular metastatic lesions appear stable as does the mid sternal lesion. The T10 metastatic lesion has some mild associated posterior cortical discontinuity and possible early epidural extension of tumor. CT ABDOMEN PELVIS FINDINGS Hepatobiliary: Stable small cyst in segment IVb of the liver. No new liver lesion is identified. Pancreas: 1.1 by 0.7 cm cystic lesion in the head of the pancreas appears similar to the prior CT examination. Connectivity to the dorsal pancreatic duct is uncertain but possible. Spleen: Unremarkable Adrenals/Urinary Tract: There is a stable left adrenal gland lateral limb adenoma, but there is a new right adrenal 1.2 by 1.3 cm mass on image 64/2 suspicious for a metastatic lesion. Right adrenal gland unremarkable. Vascular calcifications in the renal hila. No hydronephrosis or hydroureter. Stomach/Bowel: Sigmoid  colon diverticulosis. Oral contrast medium extends through to the descending colon. No appreciable dilated bowel. Vascular/Lymphatic: Aortoiliac atherosclerotic vascular disease. There is new and progressive retroperitoneal adenopathy. Left periaortic lymph node at the level of the renal artery on image 66/2 measures 1.5 cm in short axis. Left periaortic lymph node below this level on image 71/2 measures 2.0 cm in short axis. Reproductive: Unremarkable Other: Paucity of intra-abdominal adipose tissue. Musculoskeletal: There is a greater degree of lucency associated with the metastatic lesion in the L1 vertebral body  eccentric to the right on image 62/2. This lesion also appears to extend through the posterior cortex and into the anterior epidural space on image 62/2, which is new. The metastatic lesions at L3 have enlarged and ground more confluent, with new posterior bony destruction of the L3 vertebral body and epidural extension of tumor on image 76/2. IMPRESSION: 1. Progressive malignancy, with new prevascular and retroperitoneal adenopathy; progression of osseous metastatic disease with suspicion for potential epidural tumor involvement now at T10, L1, and L3; new left thoracic paraspinal tumor deposits; a suspected new metastatic lesion to the medial limb of the left adrenal gland, and mild enlargement of a right lower lobe pulmonary nodule currently 1.4 by 0.8 cm. Increased moth-eaten appearance of the metastatic lesion in the left seventh rib. The remainder of the bony lesions appear stable. 2. 1.1 by 0.7 cm cystic lesion in the head of the pancreas. This could possibly be a small intraductal papillary mucinous neoplasm or a postinflammatory cystic lesion. I would suggest surveillance of this lesion in the context of the patient's lung cancer imaging. 3. Other imaging findings of potential clinical significance: Aortic Atherosclerosis (ICD10-I70.0) and Emphysema (ICD10-J43.9). Coronary atherosclerosis with mild cardiomegaly. Mildly prominent pulmonary artery likely from secondary pulmonary arterial hypertension. Esophageal dysmotility or reflux. Chronic bilateral fibrothorax with suspected rounded atelectasis in the lingula and left lower lobe. 4. Left adrenal adenoma. 5. Sigmoid colon diverticulosis. Electronically Signed   By: Van Clines M.D.   On: 04/03/2018 15:25    ASSESSMENT AND PLAN: This is a very pleasant 72 years old white male recently diagnosed with extensive stage small cell lung cancer. The patient completed 6 cycles of systemic chemotherapy with carboplatin and etoposide. He tolerated this  treatment well except for increasing fatigue and pancytopenia. The patient has been on observation for the last 2 months. He continues to complain of increasing fatigue and weakness as well as bone pain. He had repeat CT scan of the chest, abdomen and pelvis performed recently.  I personally and independently reviewed the scan images and discussed the results with the patient and his wife and son. Unfortunately his a scan showed clear evidence for disease progression in multiple areas including mediastinal lymph nodes as well as bone and retroperitoneal metastasis. I had a lengthy discussion with the patient and his family about his current condition and treatment options.  I gave him the option of palliative care and hospice referral versus consideration of second line treatment with chemotherapy with weekly reduced dose Topotecan at a dose of 2.0 mg/M2. The patient is a still interested in proceeding with systemic chemotherapy.  I discussed with him the adverse effect of this treatment. He is expected to start the first dose of this treatment next week. He will come back for follow-up visit in 3 weeks for evaluation before starting cycle #3. For IV access, I will order a Port-A-Cath placement by interventional radiology.  I will also send Emla Cream to his pharmacy. For the  anemia of chronic disease, we will continue to monitor his hemoglobin and hematocrit closely and consider the patient for transfusion if needed. The patient was advised to call immediately if he has any concerning symptoms in the interval. The patient voices understanding of current disease status and treatment options and is in agreement with the current care plan. All questions were answered. The patient knows to call the clinic with any problems, questions or concerns. We can certainly see the patient much sooner if necessary.  Disclaimer: This note was dictated with voice recognition software. Similar sounding words can  inadvertently be transcribed and may not be corrected upon review.

## 2018-04-07 ENCOUNTER — Other Ambulatory Visit: Payer: Self-pay | Admitting: Internal Medicine

## 2018-04-07 ENCOUNTER — Ambulatory Visit (HOSPITAL_COMMUNITY)
Admission: RE | Admit: 2018-04-07 | Discharge: 2018-04-07 | Disposition: A | Discharge status: Home / Self Care | Payer: Medicare Other | Source: Ambulatory Visit | Attending: Internal Medicine | Admitting: Internal Medicine

## 2018-04-07 ENCOUNTER — Encounter (HOSPITAL_COMMUNITY): Payer: Self-pay

## 2018-04-07 DIAGNOSIS — Z79899 Other long term (current) drug therapy: Secondary | ICD-10-CM | POA: Diagnosis not present

## 2018-04-07 DIAGNOSIS — I509 Heart failure, unspecified: Secondary | ICD-10-CM | POA: Insufficient documentation

## 2018-04-07 DIAGNOSIS — I110 Hypertensive heart disease with heart failure: Secondary | ICD-10-CM | POA: Insufficient documentation

## 2018-04-07 DIAGNOSIS — J90 Pleural effusion, not elsewhere classified: Secondary | ICD-10-CM | POA: Diagnosis not present

## 2018-04-07 DIAGNOSIS — F329 Major depressive disorder, single episode, unspecified: Secondary | ICD-10-CM | POA: Diagnosis not present

## 2018-04-07 DIAGNOSIS — Z7951 Long term (current) use of inhaled steroids: Secondary | ICD-10-CM | POA: Insufficient documentation

## 2018-04-07 DIAGNOSIS — Z9981 Dependence on supplemental oxygen: Secondary | ICD-10-CM | POA: Diagnosis not present

## 2018-04-07 DIAGNOSIS — D649 Anemia, unspecified: Secondary | ICD-10-CM

## 2018-04-07 DIAGNOSIS — C3492 Malignant neoplasm of unspecified part of left bronchus or lung: Secondary | ICD-10-CM

## 2018-04-07 DIAGNOSIS — C3490 Malignant neoplasm of unspecified part of unspecified bronchus or lung: Secondary | ICD-10-CM | POA: Insufficient documentation

## 2018-04-07 DIAGNOSIS — F419 Anxiety disorder, unspecified: Secondary | ICD-10-CM | POA: Diagnosis not present

## 2018-04-07 DIAGNOSIS — J439 Emphysema, unspecified: Secondary | ICD-10-CM | POA: Diagnosis not present

## 2018-04-07 DIAGNOSIS — I272 Pulmonary hypertension, unspecified: Secondary | ICD-10-CM | POA: Diagnosis not present

## 2018-04-07 HISTORY — PX: IR US GUIDE VASC ACCESS RIGHT: IMG2390

## 2018-04-07 HISTORY — PX: IR FLUORO GUIDE PORT INSERTION RIGHT: IMG5741

## 2018-04-07 LAB — CBC WITH DIFFERENTIAL/PLATELET
Basophils Absolute: 0 10*3/uL (ref 0.0–0.1)
Basophils Relative: 0 %
EOS PCT: 2 %
Eosinophils Absolute: 0.1 10*3/uL (ref 0.0–0.7)
HEMATOCRIT: 35.2 % — AB (ref 39.0–52.0)
Hemoglobin: 11.4 g/dL — ABNORMAL LOW (ref 13.0–17.0)
LYMPHS PCT: 22 %
Lymphs Abs: 1.9 10*3/uL (ref 0.7–4.0)
MCH: 30.7 pg (ref 26.0–34.0)
MCHC: 32.4 g/dL (ref 30.0–36.0)
MCV: 94.9 fL (ref 78.0–100.0)
MONO ABS: 1.3 10*3/uL — AB (ref 0.1–1.0)
MONOS PCT: 16 %
NEUTROS ABS: 5.2 10*3/uL (ref 1.7–7.7)
Neutrophils Relative %: 60 %
PLATELETS: 261 10*3/uL (ref 150–400)
RBC: 3.71 MIL/uL — ABNORMAL LOW (ref 4.22–5.81)
RDW: 14.7 % (ref 11.5–15.5)
WBC: 8.5 10*3/uL (ref 4.0–10.5)

## 2018-04-07 LAB — PROTIME-INR
INR: 0.96
Prothrombin Time: 12.7 seconds (ref 11.4–15.2)

## 2018-04-07 MED ORDER — MIDAZOLAM HCL 2 MG/2ML IJ SOLN
INTRAMUSCULAR | Status: AC
  Filled 2018-04-07: qty 4

## 2018-04-07 MED ORDER — FENTANYL CITRATE (PF) 100 MCG/2ML IJ SOLN
INTRAMUSCULAR | Status: AC
  Filled 2018-04-07: qty 4

## 2018-04-07 MED ORDER — CEFAZOLIN SODIUM-DEXTROSE 2-4 GM/100ML-% IV SOLN
INTRAVENOUS | Status: AC
  Administered 2018-04-07: 2 g via INTRAVENOUS
  Filled 2018-04-07: qty 100

## 2018-04-07 MED ORDER — SODIUM CHLORIDE 0.9 % IV SOLN: 250.0000 mL | Freq: Once | INTRAVENOUS | Status: DC

## 2018-04-07 MED ORDER — MIDAZOLAM HCL 2 MG/2ML IJ SOLN
INTRAMUSCULAR | Status: AC | PRN
  Administered 2018-04-07: 1 mg via INTRAVENOUS
  Administered 2018-04-07: 0.5 mg via INTRAVENOUS

## 2018-04-07 MED ORDER — HEPARIN SOD (PORK) LOCK FLUSH 100 UNIT/ML IV SOLN
INTRAVENOUS | Status: AC | PRN
  Administered 2018-04-07: 500 [IU] via INTRAVENOUS

## 2018-04-07 MED ORDER — HEPARIN SOD (PORK) LOCK FLUSH 100 UNIT/ML IV SOLN
INTRAVENOUS | Status: AC
  Filled 2018-04-07: qty 5

## 2018-04-07 MED ORDER — CEFAZOLIN SODIUM-DEXTROSE 2-4 GM/100ML-% IV SOLN
2.0000 g | INTRAVENOUS | Status: AC
  Administered 2018-04-07: 2 g via INTRAVENOUS

## 2018-04-07 MED ORDER — LIDOCAINE HCL (PF) 1 % IJ SOLN
INTRAMUSCULAR | Status: AC
  Filled 2018-04-07: qty 30

## 2018-04-07 MED ORDER — LIDOCAINE-EPINEPHRINE (PF) 2 %-1:200000 IJ SOLN
INTRAMUSCULAR | Status: AC
  Filled 2018-04-07: qty 20

## 2018-04-07 MED ORDER — LIDOCAINE HCL 1 % IJ SOLN
INTRAMUSCULAR | Status: AC | PRN
  Administered 2018-04-07: 10 mL

## 2018-04-07 MED ORDER — FENTANYL CITRATE (PF) 100 MCG/2ML IJ SOLN
INTRAMUSCULAR | Status: AC | PRN
  Administered 2018-04-07: 25 ug via INTRAVENOUS
  Administered 2018-04-07: 50 ug via INTRAVENOUS

## 2018-04-07 MED ORDER — SODIUM CHLORIDE 0.9 % IV SOLN
INTRAVENOUS | Status: DC
  Administered 2018-04-07: 13:00:00 via INTRAVENOUS

## 2018-04-07 MED ORDER — LIDOCAINE-EPINEPHRINE (PF) 2 %-1:200000 IJ SOLN
INTRAMUSCULAR | Status: AC | PRN
  Administered 2018-04-07: 10 mL

## 2018-04-07 NOTE — H&P (Signed)
Referring Physician(s): Mohamed,Mohamed  Supervising Physician: Markus Daft  Patient Status:  [facility]  Chief Complaint:  "I'm getting a port"  Subjective: Patient familiar to IR service from prior left thoracentesis on 08/08/17 and left supraclavicular lymph node biopsy on 08/25/17.  He has a history of metastatic small cell lung cancer and is status post chemotherapy.  He now has evidence of disease progression and presents today for Port-A-Cath placement for additional palliative treatment.  He currently denies fever, headache, nausea, vomiting or bleeding.  He has had weight loss, intermittent chest discomfort, dyspnea, cough, abdominal and back discomfort.  Past Medical History:  Diagnosis Date  . Anxiety   . CHF (congestive heart failure) (HCC)    x 3 episodes- [place]. pt. living here since 11-'1-16  . COPD with emphysema ([place])   . Depression   . Dyspnea   . HTN (hypertension)   . On home oxygen therapy   . Pulmonary HTN (HCC)    Dr. Marlyn Corporal is following.  . Recurrent left pleural effusion   . Sciatic leg pain    INTERMITTENT- occ. uses Hydrocodone as needed  . Seasonal allergies   . Small cell lung cancer, left ([place]) 09/15/2017   Past Surgical History:  Procedure Laterality Date  . ANGIOPLASTY    . CARDIAC CATHETERIZATION     '15- [place]- no issues now.  . COLONOSCOPY WITH PROPOFOL N/A 02/09/2016   Procedure: COLONOSCOPY WITH PROPOFOL;  Surgeon: Carol Ada, MD;  Location: [facility];  Service: Endoscopy;  Laterality: N/A;  . IR THORACENTESIS ASP PLEURAL SPACE W/IMG GUIDE  08/08/2017  . MASTOIDECTOMY Left   . TONSILLECTOMY       Allergies: Patient has no known allergies.  Medications: Prior to Admission medications   Medication Sig Start Date End Date Taking? Authorizing Provider  albuterol (PROVENTIL) (2.5 MG/3ML) 0.083% nebulizer solution Take 3 mLs (2.5 mg total) by nebulization every 6 (six) hours as needed for wheezing or shortness of  breath. 01/08/16   Chesley Mires, MD  dronabinol (MARINOL) 2.5 MG capsule Take 1 capsule (2.5 mg total) by mouth 2 (two) times daily before a meal. Patient not taking: Reported on 04/06/2018 10/07/17   Maryanna Shape, NP  feeding supplement (BOOST HIGH PROTEIN) LIQD Take 1 Container by mouth daily.  09/14/15   [provider]  fluticasone (FLONASE) 50 MCG/ACT nasal spray Place 2 sprays into both nostrils daily as needed for allergies or rhinitis.    [provider]  ibuprofen (ADVIL,MOTRIN) 200 MG tablet Take 600 mg by mouth 3 (three) times daily as needed (pain).     [provider]  ipratropium (ATROVENT) 0.02 % nebulizer solution Take 0.5 mg by nebulization every 4 (four) hours as needed for wheezing or shortness of breath.    [provider]  lidocaine-prilocaine (EMLA) cream Apply 1 application topically as needed. 04/06/18   Curt Bears, MD  loratadine (CLARITIN) 10 MG tablet Take 10 mg by mouth daily as needed for allergies.    [provider]  prochlorperazine (COMPAZINE) 10 MG tablet Take 1 tablet (10 mg total) by mouth every 6 (six) hours as needed for nausea or vomiting. Patient not taking: Reported on 04/06/2018 09/15/17   Maryanna Shape, NP  sertraline (ZOLOFT) 50 MG tablet Take 50 mg by mouth 2 (two) times daily.    [provider]  sildenafil (REVATIO) 20 MG tablet Take 20 mg by mouth as directed.    [provider]  torsemide (  DEMADEX) 20 MG tablet Take 1 tablet (20 mg total) by mouth daily as needed. 12/19/17   Sherren Mocha, MD     Vital Signs: BP 118/83 (BP Location: Right Arm)   Pulse (!) 105   Temp 98.1 F (36.7 C) (Oral)   Resp 18   SpO2 92%   Physical Exam cachectic appearing white male; chest with distant breath sounds bilaterally, more so on left.  Heart with regular rate and rhythm.  Abdomen soft, positive bowel sounds, some mild generalized tenderness to palpation; no lower extremity  edema.  Imaging: No results found.  Labs:  CBC: Recent Labs    12/26/17 1119 12/29/17 1329 01/05/18 0824 01/12/18 1338 01/19/18 1037 01/26/18 1038 04/03/18 0935  WBC 1.7* 4.3 12.8* 12.1* 3.5* 10.4* 7.0  HGB 5.9* 7.8* 8.3* 7.5*  --   --   --   HCT 18.5* 23.6* 25.7* 23.4* 23.6* 27.8* 37.6*  PLT 78* 32* 219 249 28* 188 237    COAGS: Recent Labs    09/04/17 1145  INR 1.01    BMP: Recent Labs    01/12/18 1338 01/19/18 1037 01/26/18 1038 04/03/18 0935  NA 139 141 140 138  K 4.1 3.6 3.8 3.6  CL 96* 99 96* 94*  CO2 35* 36* 37* 34*  GLUCOSE 131 79 105 113  BUN 23 14 11 20   CALCIUM 9.4 9.4 9.6 10.7*  CREATININE 0.76 0.73 0.80 0.81  GFRNONAA >60 >60 >60 >60  GFRAA >60 >60 >60 >60    LIVER FUNCTION TESTS: Recent Labs    01/12/18 1338 01/19/18 1037 01/26/18 1038 04/03/18 0935  BILITOT 0.4 <0.2* 0.3 <0.2*  AST 14 12 13  42*  ALT 9 9 <6 15  ALKPHOS 124 102 101 91  PROT 6.1* 6.1* 6.3* 7.5  ALBUMIN 3.4* 3.4* 3.3* 4.0    Assessment and Plan: Patient with extensive stage metastatic small cell lung cancer and prior chemotherapy; now with disease progression on imaging.  Presents today for Port-A-Cath placement for additional treatment.Risks and benefits of image guided port-a-catheter placement was discussed with the patient including, but not limited to bleeding, infection, pneumothorax, or fibrin sheath development and need for additional procedures.  All of the patient's questions were answered, patient is agreeable to proceed. Consent signed and in chart.     Electronically Signed: D. Rowe Robert, PA-C 04/07/2018, 1:16 PM   I spent a total of 25 minutes at the the patient's bedside AND on the patient's hospital floor or unit, greater than 50% of which was counseling/coordinating care for Port-A-Cath placement

## 2018-04-07 NOTE — Discharge Instructions (Signed)
Implanted Port Insertion, Care After °This sheet gives you information about how to care for yourself after your procedure. Your health care provider may also give you more specific instructions. If you have problems or questions, contact your health care provider. °What can I expect after the procedure? °After your procedure, it is common to have: °· Discomfort at the port insertion site. °· Bruising on the skin over the port. This should improve over 3-4 days. ° °Follow these instructions at home: °Port care °· After your port is placed, you will get a manufacturer's information card. The card has information about your port. Keep this card with you at all times. °· Take care of the port as told by your health care provider. Ask your health care provider if you or a family member can get training for taking care of the port at home. A home health care nurse may also take care of the port. °· Make sure to remember what type of port you have. °Incision care °· Follow instructions from your health care provider about how to take care of your port insertion site. Make sure you: °? Wash your hands with soap and water before you change your bandage (dressing). If soap and water are not available, use hand sanitizer. °? Change your dressing as told by your health care provider. °? Leave stitches (sutures), skin glue, or adhesive strips in place. These skin closures may need to stay in place for 2 weeks or longer. If adhesive strip edges start to loosen and curl up, you may trim the loose edges. Do not remove adhesive strips completely unless your health care provider tells you to do that. °· Check your port insertion site every day for signs of infection. Check for: °? More redness, swelling, or pain. °? More fluid or blood. °? Warmth. °? Pus or a bad smell. °General instructions °· Do not take baths, swim, or use a hot tub until your health care provider approves. °· Do not lift anything that is heavier than 10 lb (4.5  kg) for a week, or as told by your health care provider. °· Ask your health care provider when it is okay to: °? Return to work or school. °? Resume usual physical activities or sports. °· Do not drive for 24 hours if you were given a medicine to help you relax (sedative). °· Take over-the-counter and prescription medicines only as told by your health care provider. °· Wear a medical alert bracelet in case of an emergency. This will tell any health care providers that you have a port. °· Keep all follow-up visits as told by your health care provider. This is important. °Contact a health care provider if: °· You cannot flush your port with saline as directed, or you cannot draw blood from the port. °· You have a fever or chills. °· You have more redness, swelling, or pain around your port insertion site. °· You have more fluid or blood coming from your port insertion site. °· Your port insertion site feels warm to the touch. °· You have pus or a bad smell coming from the port insertion site. °Get help right away if: °· You have chest pain or shortness of breath. °· You have bleeding from your port that you cannot control. °Summary °· Take care of the port as told by your health care provider. °· Change your dressing as told by your health care provider. °· Keep all follow-up visits as told by your health care provider. °  This information is not intended to replace advice given to you by your health care provider. Make sure you discuss any questions you have with your health care provider. °Document Released: 09/29/2013 Document Revised: 10/30/2016 Document Reviewed: 10/30/2016 °Elsevier Interactive Patient Education © 2017 Else[employer] Inc[employer] °Moderate Conscious Sedation, Adult, Care After °These instructions provide you with information about caring for yourself after your procedure. Your health care provider may also give you more specific instructions. Your treatment has been planned according to current medical  practices, but problems sometimes occur. Call your health care provider if you have any problems or questions after your procedure. °What can I expect after the procedure? °After your procedure, it is common: °· To feel sleepy for several hours. °· To feel clumsy and have poor balance for several hours. °· To have poor judgment for several hours. °· To vomit if you eat too soon. ° °Follow these instructions at home: °For at least 24 hours after the procedure: ° °· Do not: °? Participate in activities where you could fall or become injured. °? Drive. °? Use heavy machinery. °? Drink alcohol. °? Take sleeping pills or medicines that cause drowsiness. °? Make important decisions or sign legal documents. °? Take care of children on your own. °· Rest. °Eating and drinking °· Follow the diet recommended by your health care provider. °· If you vomit: °? Drink water, juice, or soup when you can drink without vomiting. °? Make sure you have little or no nausea before eating solid foods. °General instructions °· Have a responsible adult stay with you until you are awake and alert. °· Take over-the-counter and prescription medicines only as told by your health care provider. °· If you smoke, do not smoke without supervision. °· Keep all follow-up visits as told by your health care provider. This is important. °Contact a health care provider if: °· You keep feeling nauseous or you keep vomiting. °· You feel light-headed. °· You develop a rash. °· You have a fever. °Get help right away if: °· You have trouble breathing. °This information is not intended to replace advice given to you by your health care provider. Make sure you discuss any questions you have with your health care provider. °Document Released: 09/29/2013 Document Revised: 05/13/2016 Document Reviewed: 03/30/2016 °Elsevier Interactive Patient Education © 2018 Elsevier Inc. ° °

## 2018-04-07 NOTE — Procedures (Signed)
Placement of right jugular port.  Tip at SVC/RA junction.  Minimal blood loss and no immediate complication.

## 2018-04-08 ENCOUNTER — Telehealth: Payer: Self-pay | Admitting: Internal Medicine

## 2018-04-08 NOTE — Telephone Encounter (Signed)
Appointments scheduled calendar / letter mailed to patient and patient was notified also per 4/15 los

## 2018-04-10 ENCOUNTER — Other Ambulatory Visit: Payer: Self-pay | Admitting: *Deleted

## 2018-04-10 MED ORDER — OXYCODONE-ACETAMINOPHEN 5-325 MG PO TABS: 1.0000 | ORAL_TABLET | Freq: Four times a day (QID) | ORAL | 0 refills | Status: DC | PRN

## 2018-04-10 NOTE — Telephone Encounter (Signed)
Notified pt Rx for percocet ready for pick up

## 2018-04-14 ENCOUNTER — Inpatient Hospital Stay: Payer: Medicare Other

## 2018-04-14 VITALS — BP 99/54 | HR 98 | Temp 97.6°F | Resp 20

## 2018-04-14 DIAGNOSIS — Z5111 Encounter for antineoplastic chemotherapy: Secondary | ICD-10-CM | POA: Diagnosis not present

## 2018-04-14 DIAGNOSIS — C3492 Malignant neoplasm of unspecified part of left bronchus or lung: Secondary | ICD-10-CM

## 2018-04-14 LAB — CBC WITH DIFFERENTIAL (CANCER CENTER ONLY)
Basophils Absolute: 0 10*3/uL (ref 0.0–0.1)
Basophils Relative: 1 %
Eosinophils Absolute: 0 10*3/uL (ref 0.0–0.5)
Eosinophils Relative: 0 %
HEMATOCRIT: 35.8 % — AB (ref 38.4–49.9)
HEMOGLOBIN: 11.4 g/dL — AB (ref 13.0–17.1)
LYMPHS ABS: 1.7 10*3/uL (ref 0.9–3.3)
LYMPHS PCT: 21 %
MCH: 30.3 pg (ref 27.2–33.4)
MCHC: 31.8 g/dL — ABNORMAL LOW (ref 32.0–36.0)
MCV: 95.2 fL (ref 79.3–98.0)
MONO ABS: 0.9 10*3/uL (ref 0.1–0.9)
Monocytes Relative: 11 %
NEUTROS ABS: 5.3 10*3/uL (ref 1.5–6.5)
NEUTROS PCT: 67 %
Platelet Count: 286 10*3/uL (ref 140–400)
RBC: 3.76 MIL/uL — ABNORMAL LOW (ref 4.20–5.82)
RDW: 14.3 % (ref 11.0–14.6)
WBC Count: 7.9 10*3/uL (ref 4.0–10.3)

## 2018-04-14 LAB — CMP (CANCER CENTER ONLY)
ALBUMIN: 4 g/dL (ref 3.5–5.0)
ALK PHOS: 81 U/L (ref 40–150)
ALT: 10 U/L (ref 0–55)
ANION GAP: 13 — AB (ref 3–11)
AST: 43 U/L — ABNORMAL HIGH (ref 5–34)
BUN: 21 mg/dL (ref 7–26)
CALCIUM: 10.7 mg/dL — AB (ref 8.4–10.4)
CO2: 35 mmol/L — ABNORMAL HIGH (ref 22–29)
Chloride: 89 mmol/L — ABNORMAL LOW (ref 98–109)
Creatinine: 0.91 mg/dL (ref 0.70–1.30)
GFR, Est AFR Am: >60 mL/min (ref >60)
GLUCOSE: 160 mg/dL — AB (ref 70–140)
POTASSIUM: 3.5 mmol/L (ref 3.5–5.1)
Sodium: 137 mmol/L (ref 136–145)
TOTAL PROTEIN: 7.5 g/dL (ref 6.4–8.3)

## 2018-04-14 MED ORDER — SODIUM CHLORIDE 0.9% FLUSH
10.0000 mL | INTRAVENOUS | Status: DC | PRN
  Administered 2018-04-14: 10 mL
  Filled 2018-04-14: qty 10

## 2018-04-14 MED ORDER — PROCHLORPERAZINE MALEATE 10 MG PO TABS
10.0000 mg | ORAL_TABLET | Freq: Once | ORAL | Status: AC
  Administered 2018-04-14: 10 mg via ORAL

## 2018-04-14 MED ORDER — TOPOTECAN HCL CHEMO INJECTION 4 MG
2.0000 mg/m2 | Freq: Once | INTRAVENOUS | Status: AC
  Administered 2018-04-14: 3.1 mg via INTRAVENOUS
  Filled 2018-04-14: qty 3.1

## 2018-04-14 MED ORDER — PROCHLORPERAZINE MALEATE 10 MG PO TABS
ORAL_TABLET | ORAL | Status: AC
  Filled 2018-04-14: qty 1

## 2018-04-14 MED ORDER — SODIUM CHLORIDE 0.9 % IV SOLN
Freq: Once | INTRAVENOUS | Status: AC
  Administered 2018-04-14: 10:00:00 via INTRAVENOUS

## 2018-04-14 MED ORDER — HEPARIN SOD (PORK) LOCK FLUSH 100 UNIT/ML IV SOLN
500.0000 [IU] | Freq: Once | INTRAVENOUS | Status: AC | PRN
  Administered 2018-04-14: 500 [IU]
  Filled 2018-04-14: qty 5

## 2018-04-14 NOTE — Progress Notes (Signed)
Duplicate

## 2018-04-14 NOTE — Progress Notes (Signed)
Dr. Julien Nordmann reviewed all lab results today.  Proceed with chemo as per Dr. Julien Nordmann.

## 2018-04-14 NOTE — Patient Instructions (Signed)
[facility] Discharge Instructions for Patients Receiving Chemotherapy  Today you received the following chemotherapy agents :  Topotecan.  To help prevent nausea and vomiting after your treatment, we encourage you to take your nausea medication as prescribed.  TAKE  COLACE 2 TABS DAILY, AND ALSO TAKE MIRALAX 17 G DAILY TO HELP WITH CONSTIPATION PROBLEM.   IF STILL NO RESULTS BY TOMORROW, TAKE  MAGNESIUM CITRATE  - DRINK HALF BOTTLE , WAIT 2 HOURS, IF STILL NO RESULTS, TAKE REST OF BOTTLE. CALL OFFICE IF CONSTIPATION PROBLEM STILL NOT RESOLVED WITH ABOVE INSTRUCTIONS.    If you develop nausea and vomiting that is not controlled by your nausea medication, call the clinic.   BELOW ARE SYMPTOMS THAT SHOULD BE REPORTED IMMEDIATELY:  *FEVER GREATER THAN 100.5 F  *CHILLS WITH OR WITHOUT FEVER  NAUSEA AND VOMITING THAT IS NOT CONTROLLED WITH YOUR NAUSEA MEDICATION  *UNUSUAL SHORTNESS OF BREATH  *UNUSUAL BRUISING OR BLEEDING  TENDERNESS IN MOUTH AND THROAT WITH OR WITHOUT PRESENCE OF ULCERS  *URINARY PROBLEMS  *BOWEL PROBLEMS  UNUSUAL RASH Items with * indicate a potential emergency and should be followed up as soon as possible.  Feel free to call the clinic should you have any questions or concerns. The clinic phone number is [phone].  Please show the [facility] at check-in to the Emergency Department and triage nurse.

## 2018-04-15 ENCOUNTER — Telehealth: Payer: Self-pay | Admitting: *Deleted

## 2018-04-15 NOTE — Telephone Encounter (Signed)
-----   Message from Jarvis Morgan, RN sent at 04/15/2018 10:26 AM EDT ----- Regarding: Chemo follow up 04/14/18 -  First time Topotecan,  Dr. Vista Mink, TB, RN.

## 2018-04-15 NOTE — Telephone Encounter (Signed)
F/u call to pt to check status. Unable to reach

## 2018-04-21 ENCOUNTER — Other Ambulatory Visit: Payer: Self-pay | Admitting: Medical

## 2018-04-21 ENCOUNTER — Inpatient Hospital Stay: Payer: Medicare Other

## 2018-04-21 ENCOUNTER — Ambulatory Visit (HOSPITAL_COMMUNITY)
Admission: RE | Admit: 2018-04-21 | Discharge: 2018-04-21 | Disposition: A | Discharge status: Home / Self Care | Payer: Medicare Other | Source: Ambulatory Visit | Attending: Medical | Admitting: Medical

## 2018-04-21 ENCOUNTER — Inpatient Hospital Stay: Payer: Medicare Other | Attending: Internal Medicine | Admitting: Medical

## 2018-04-21 VITALS — BP 124/78 | HR 99 | Temp 98.5°F | Resp 19

## 2018-04-21 DIAGNOSIS — C3492 Malignant neoplasm of unspecified part of left bronchus or lung: Secondary | ICD-10-CM

## 2018-04-21 DIAGNOSIS — J439 Emphysema, unspecified: Secondary | ICD-10-CM | POA: Insufficient documentation

## 2018-04-21 DIAGNOSIS — I509 Heart failure, unspecified: Secondary | ICD-10-CM | POA: Diagnosis not present

## 2018-04-21 DIAGNOSIS — Z5111 Encounter for antineoplastic chemotherapy: Secondary | ICD-10-CM | POA: Diagnosis not present

## 2018-04-21 DIAGNOSIS — F329 Major depressive disorder, single episode, unspecified: Secondary | ICD-10-CM | POA: Diagnosis not present

## 2018-04-21 DIAGNOSIS — C786 Secondary malignant neoplasm of retroperitoneum and peritoneum: Secondary | ICD-10-CM | POA: Insufficient documentation

## 2018-04-21 DIAGNOSIS — C7951 Secondary malignant neoplasm of bone: Secondary | ICD-10-CM | POA: Diagnosis not present

## 2018-04-21 DIAGNOSIS — I272 Pulmonary hypertension, unspecified: Secondary | ICD-10-CM | POA: Diagnosis not present

## 2018-04-21 DIAGNOSIS — S2231XA Fracture of one rib, right side, initial encounter for closed fracture: Secondary | ICD-10-CM | POA: Insufficient documentation

## 2018-04-21 DIAGNOSIS — W19XXXA Unspecified fall, initial encounter: Secondary | ICD-10-CM | POA: Diagnosis not present

## 2018-04-21 DIAGNOSIS — D638 Anemia in other chronic diseases classified elsewhere: Secondary | ICD-10-CM | POA: Diagnosis not present

## 2018-04-21 DIAGNOSIS — Z95828 Presence of other vascular implants and grafts: Secondary | ICD-10-CM

## 2018-04-21 DIAGNOSIS — I110 Hypertensive heart disease with heart failure: Secondary | ICD-10-CM | POA: Insufficient documentation

## 2018-04-21 DIAGNOSIS — Z79899 Other long term (current) drug therapy: Secondary | ICD-10-CM | POA: Diagnosis not present

## 2018-04-21 DIAGNOSIS — Z981 Arthrodesis status: Secondary | ICD-10-CM | POA: Diagnosis not present

## 2018-04-21 DIAGNOSIS — M25511 Pain in right shoulder: Secondary | ICD-10-CM

## 2018-04-21 DIAGNOSIS — D61818 Other pancytopenia: Secondary | ICD-10-CM | POA: Insufficient documentation

## 2018-04-21 DIAGNOSIS — F419 Anxiety disorder, unspecified: Secondary | ICD-10-CM | POA: Diagnosis not present

## 2018-04-21 DIAGNOSIS — K7689 Other specified diseases of liver: Secondary | ICD-10-CM | POA: Diagnosis not present

## 2018-04-21 LAB — CBC WITH DIFFERENTIAL (CANCER CENTER ONLY)
BASOS ABS: 0 10*3/uL (ref 0.0–0.1)
Basophils Relative: 1 %
EOS ABS: 0 10*3/uL (ref 0.0–0.5)
Eosinophils Relative: 0 %
HCT: 33.9 % — ABNORMAL LOW (ref 38.4–49.9)
Hemoglobin: 10.9 g/dL — ABNORMAL LOW (ref 13.0–17.1)
LYMPHS ABS: 1.7 10*3/uL (ref 0.9–3.3)
LYMPHS PCT: 30 %
MCH: 30.3 pg (ref 27.2–33.4)
MCHC: 32.2 g/dL (ref 32.0–36.0)
MCV: 94.2 fL (ref 79.3–98.0)
Monocytes Absolute: 0.3 10*3/uL (ref 0.1–0.9)
Monocytes Relative: 6 %
Neutro Abs: 3.6 10*3/uL (ref 1.5–6.5)
Neutrophils Relative %: 63 %
PLATELETS: 200 10*3/uL (ref 140–400)
RBC: 3.6 MIL/uL — AB (ref 4.20–5.82)
RDW: 14.1 % (ref 11.0–14.6)
WBC: 5.7 10*3/uL (ref 4.0–10.3)

## 2018-04-21 LAB — CMP (CANCER CENTER ONLY)
ALK PHOS: 89 U/L (ref 40–150)
ALT: 12 U/L (ref 0–55)
AST: 45 U/L — AB (ref 5–34)
Albumin: 3.9 g/dL (ref 3.5–5.0)
Anion gap: 9 (ref 3–11)
BUN: 21 mg/dL (ref 7–26)
CALCIUM: 10.7 mg/dL — AB (ref 8.4–10.4)
CHLORIDE: 93 mmol/L — AB (ref 98–109)
CO2: 34 mmol/L — AB (ref 22–29)
CREATININE: 0.78 mg/dL (ref 0.70–1.30)
GFR, Estimated: >60 mL/min (ref >60)
Glucose, Bld: 148 mg/dL — ABNORMAL HIGH (ref 70–140)
Potassium: 3.6 mmol/L (ref 3.5–5.1)
SODIUM: 136 mmol/L (ref 136–145)
Total Bilirubin: 0.2 mg/dL (ref 0.2–1.2)
Total Protein: 7.1 g/dL (ref 6.4–8.3)

## 2018-04-21 MED ORDER — OXYCODONE-ACETAMINOPHEN 5-325 MG PO TABS
ORAL_TABLET | ORAL | Status: AC
  Filled 2018-04-21: qty 2

## 2018-04-21 MED ORDER — SODIUM CHLORIDE 0.9% FLUSH
10.0000 mL | INTRAVENOUS | Status: DC | PRN
  Administered 2018-04-21: 10 mL
  Filled 2018-04-21: qty 10

## 2018-04-21 MED ORDER — SODIUM CHLORIDE 0.9 % IV SOLN
Freq: Once | INTRAVENOUS | Status: AC
  Administered 2018-04-21: 09:00:00 via INTRAVENOUS

## 2018-04-21 MED ORDER — PROCHLORPERAZINE MALEATE 10 MG PO TABS
ORAL_TABLET | ORAL | Status: AC
  Filled 2018-04-21: qty 1

## 2018-04-21 MED ORDER — HEPARIN SOD (PORK) LOCK FLUSH 100 UNIT/ML IV SOLN
500.0000 [IU] | Freq: Once | INTRAVENOUS | Status: AC | PRN
  Administered 2018-04-21: 500 [IU]
  Filled 2018-04-21: qty 5

## 2018-04-21 MED ORDER — PROCHLORPERAZINE MALEATE 10 MG PO TABS
10.0000 mg | ORAL_TABLET | Freq: Once | ORAL | Status: AC
  Administered 2018-04-21: 10 mg via ORAL

## 2018-04-21 MED ORDER — TOPOTECAN HCL CHEMO INJECTION 4 MG
1.9500 mg/m2 | Freq: Once | INTRAVENOUS | Status: AC
  Administered 2018-04-21: 3 mg via INTRAVENOUS
  Filled 2018-04-21: qty 3

## 2018-04-21 MED ORDER — OXYCODONE-ACETAMINOPHEN 5-325 MG PO TABS
2.0000 | ORAL_TABLET | Freq: Once | ORAL | Status: AC
  Administered 2018-04-21: 2 via ORAL

## 2018-04-21 MED ORDER — SODIUM CHLORIDE 0.9% FLUSH
10.0000 mL | INTRAVENOUS | Status: DC | PRN
  Administered 2018-04-21: 10 mL via INTRAVENOUS
  Filled 2018-04-21: qty 10

## 2018-04-21 NOTE — Patient Instructions (Signed)
[facility] Discharge Instructions for Patients Receiving Chemotherapy  Today you received the following chemotherapy agents: Hycamtin  To help prevent nausea and vomiting after your treatment, we encourage you to take your nausea medication as directed.   If you develop nausea and vomiting that is not controlled by your nausea medication, call the clinic.   BELOW ARE SYMPTOMS THAT SHOULD BE REPORTED IMMEDIATELY:  *FEVER GREATER THAN 100.5 F  *CHILLS WITH OR WITHOUT FEVER  NAUSEA AND VOMITING THAT IS NOT CONTROLLED WITH YOUR NAUSEA MEDICATION  *UNUSUAL SHORTNESS OF BREATH  *UNUSUAL BRUISING OR BLEEDING  TENDERNESS IN MOUTH AND THROAT WITH OR WITHOUT PRESENCE OF ULCERS  *URINARY PROBLEMS  *BOWEL PROBLEMS  UNUSUAL RASH Items with * indicate a potential emergency and should be followed up as soon as possible.  Feel free to call the clinic should you have any questions or concerns. The clinic phone number is [phone].  Please show the [facility] at check-in to the Emergency Department and triage nurse.

## 2018-04-21 NOTE — Patient Instructions (Signed)
Implanted Port Home Guide An implanted port is a type of central line that is placed under the skin. Central lines are used to provide IV access when treatment or nutrition needs to be given through a person's veins. Implanted ports are used for long-term IV access. An implanted port may be placed because:  You need IV medicine that would be irritating to the small veins in your hands or arms.  You need long-term IV medicines, such as antibiotics.  You need IV nutrition for a long period.  You need frequent blood draws for lab tests.  You need dialysis.  Implanted ports are usually placed in the chest area, but they can also be placed in the upper arm, the abdomen, or the leg. An implanted port has two main parts:  Reservoir. The reservoir is round and will appear as a small, raised area under your skin. The reservoir is the part where a needle is inserted to give medicines or draw blood.  Catheter. The catheter is a thin, flexible tube that extends from the reservoir. The catheter is placed into a large vein. Medicine that is inserted into the reservoir goes into the catheter and then into the vein.  How will I care for my incision site? Do not get the incision site wet. Bathe or shower as directed by your health care provider. How is my port accessed? Special steps must be taken to access the port:  Before the port is accessed, a numbing cream can be placed on the skin. This helps numb the skin over the port site.  Your health care provider uses a sterile technique to access the port. ? Your health care provider must put on a mask and sterile gloves. ? The skin over your port is cleaned carefully with an antiseptic and allowed to dry. ? The port is gently pinched between sterile gloves, and a needle is inserted into the port.  Only "non-coring" port needles should be used to access the port. Once the port is accessed, a blood return should be checked. This helps ensure that the port  is in the vein and is not clogged.  If your port needs to remain accessed for a constant infusion, a clear (transparent) bandage will be placed over the needle site. The bandage and needle will need to be changed every week, or as directed by your health care provider.  Keep the bandage covering the needle clean and dry. Do not get it wet. Follow your health care provider's instructions on how to take a shower or bath while the port is accessed.  If your port does not need to stay accessed, no bandage is needed over the port.  What is flushing? Flushing helps keep the port from getting clogged. Follow your health care provider's instructions on how and when to flush the port. Ports are usually flushed with saline solution or a medicine called heparin. The need for flushing will depend on how the port is used.  If the port is used for intermittent medicines or blood draws, the port will need to be flushed: ? After medicines have been given. ? After blood has been drawn. ? As part of routine maintenance.  If a constant infusion is running, the port may not need to be flushed.  How long will my port stay implanted? The port can stay in for as long as your health care provider thinks it is needed. When it is time for the port to come out, surgery will be   done to remove it. The procedure is similar to the one performed when the port was put in. When should I seek immediate medical care? When you have an implanted port, you should seek immediate medical care if:  You notice a bad smell coming from the incision site.  You have swelling, redness, or drainage at the incision site.  You have more swelling or pain at the port site or the surrounding area.  You have a fever that is not controlled with medicine.  This information is not intended to replace advice given to you by your health care provider. Make sure you discuss any questions you have with your health care provider. Document  Released: 12/09/2005 Document Revised: 05/16/2016 Document Reviewed: 08/16/2013 Elsevier Interactive Patient Education  2017 Elsevier Inc.  

## 2018-04-21 NOTE — Progress Notes (Signed)
Nutrition Assessment   Reason for Assessment:   Patient identified on Malnutrition Screening report for poor appetite and weight loss  ASSESSMENT:  72 year old male with metastatic lung cancer.  Patient on second line chemotherapy.  Past medical history of CHF, COPD, HTN.    Met with patient and wife during infusion this am.  Patient shook head no and reported that RD could not help him.  "I am not a grazer."  "I eat what I can eat."  Wife reports that yesterday he drank boost plus in am and ate chocolate peanut butter cups and that was it yesterday.    Patient reports recent fall and problems with constipation.    RN, Stanton Kidney came to discuss plan of care with patient and wife.  Wife became tearful and RD stepped away.    Nutrition Focused Physical Exam: deferred  Medications: marinol  Labs: glucose 160  Anthropometrics:   Height: 67 inches Weight: 108 lb noted on 4/15 Noted 118 lb on 02/03/18 BMI: 16  8% weight loss in 2 months, significant  Estimated Energy Needs  Kcals: 1470-1715 calories/d Protein: 74-85 g/d Fluid: 1.7  NUTRITION DIAGNOSIS: Inadequate oral intake related to cancer and cancer related side effects as evidenced by poor appetite, 8% weight loss in 2 months   INTERVENTION:   Encouraged supplementation with boost plus (360 calorie shake). Coupons provided Brief discussion on ways to increase calories and protein. Handout given to patient and wife.   RD available for questions or if can be of further assistance    MONITORING, EVALUATION, GOAL: Patient will consume adequate calories and protein to maintain nutrition during treatment   NEXT VISIT: as needed  Kyan Giannone B. Zenia Resides, [place], [place] Registered Dietitian [phone] (pager)

## 2018-04-21 NOTE — Progress Notes (Signed)
These results were called to Jose Mckinney and were reviewed with him . His were answered. He expressed understanding.

## 2018-04-21 NOTE — Progress Notes (Signed)
These results were called to Malena Peer and were reviewed with him . His were answered. He expressed understanding.

## 2018-04-22 ENCOUNTER — Other Ambulatory Visit: Payer: Self-pay | Admitting: Medical Oncology

## 2018-04-22 DIAGNOSIS — C3492 Malignant neoplasm of unspecified part of left bronchus or lung: Secondary | ICD-10-CM

## 2018-04-22 MED ORDER — OXYCODONE-ACETAMINOPHEN 5-325 MG PO TABS: 1.0000 | ORAL_TABLET | Freq: Four times a day (QID) | ORAL | 0 refills | Status: DC | PRN

## 2018-04-22 NOTE — Progress Notes (Signed)
refill requested. Ready for pick up.

## 2018-04-23 NOTE — Progress Notes (Signed)
[facility] Progress Note   Jose Mckinney [id number] November 10, 1946 72 y.o.  Jose Mckinney is managed by Dr. Fanny Bien. Jose Mckinney  Actively treated with chemotherapy: yes  Current Therapy: topotecan  Last Treated: 04/21/2018   Assessment: Plan:    Acute pain of right shoulder   Acute pain in the right shoulder after a fall: Patient was referred for an x-ray of the right shoulder and right humerus.  He was given Percocet 5-325, 2 tablets p.o. x1  Please see After Visit Summary for patient specific instructions.  Future Appointments  Date Time Provider [place]  04/28/2018  8:15 AM [facility] LAB 3 [facility] None  04/28/2018  8:30 AM [facility] I27 DNS [facility] None  04/28/2018  9:00 AM Curcio, Roselie Awkward, NP [facility] None  04/28/2018 10:00 AM [facility] C9 [facility] None  05/05/2018  8:15 AM [facility] LAB 4 [facility] None  05/05/2018  8:30 AM [facility] J32 DNS [facility] None  05/05/2018 10:00 AM [facility] I27 DNS [facility] None  05/12/2018  8:45 AM [facility] LAB 5 [facility] None  05/12/2018  9:00 AM [facility] I27 DNS [facility] None  05/12/2018  9:30 AM Curcio, Kristin R, NP [facility] None  05/12/2018 10:15 AM [facility] F21 [facility] None  05/19/2018  8:15 AM [facility] LAB 2 [facility] None  05/19/2018  8:30 AM [facility] FLUSH NURSE 2 [facility] None  05/19/2018 10:30 AM [facility] F18 [facility] None  05/26/2018  8:45 AM [facility] LAB 5 [facility] None  05/26/2018  9:00 AM [facility] FLUSH NURSE 2 [facility] None  05/26/2018  9:30 AM Curcio, Roselie Awkward, NP [facility] None  05/26/2018 10:15 AM [facility] F21 [facility] None  06/02/2018  8:15 AM [facility] LAB 3 [facility] None  06/02/2018  8:30 AM [facility] FLUSH NURSE 2 [facility] None  06/02/2018  9:30 AM Curt Bears, MD [facility] None  06/02/2018 10:15 AM [facility] G23 [facility] None    No orders of the defined types were placed in this encounter.       Subjective:   Patient ID:  Jose Mckinney is a 72 y.o. (DOB 1946-08-16) male.  Chief Complaint: No chief complaint on file.   HPI Jose Mckinney is a 72 year old male with a diagnosis of a extensive stage (T3, and 3, M1 C) small cell lung cancern who was diagnosed in September 2018 when he presented with widespread malignancy including nodal stations, bones, left pleural space, and the lungs.  He is followed by Dr. Fanny Bien. Jose Mckinney and is currently treated with topotecan with his last treatment completed on 04/21/2018.  This provider was asked to see the patient in the infusion room.  The patient reports that he fell last Thursday at home onto his right shoulder.  He has a history of a right rotator cuff injury.  He is having significant pain in his right shoulder.  He has been taking Percocet at home for management of his pain.   Medications: I have reviewed the patient's current medications.  Allergies: No Known Allergies  Past Medical History:  Diagnosis Date  . Anxiety   . CHF (congestive heart failure) (HCC)    x 3 episodes- [place]. pt. living here since 11-'1-16  . COPD with emphysema ([place])   . Depression   . Dyspnea   . HTN (hypertension)   . On home oxygen therapy   . Pulmonary HTN (HCC)    Dr. Marlyn Corporal is following.  . Recurrent left pleural effusion   . Sciatic leg pain    INTERMITTENT- occ. uses Hydrocodone as needed  .  Seasonal allergies   . Small cell lung cancer, left ([place]) 09/15/2017    Past Surgical History:  Procedure Laterality Date  . ANGIOPLASTY    . CARDIAC CATHETERIZATION     '15- [place]- no issues now.  . COLONOSCOPY WITH PROPOFOL N/A 02/09/2016   Procedure: COLONOSCOPY WITH PROPOFOL;  Surgeon: Carol Ada, MD;  Location: [facility];  Service: Endoscopy;  Laterality: N/A;  . IR FLUORO GUIDE PORT INSERTION RIGHT  04/07/2018  . IR THORACENTESIS ASP PLEURAL SPACE W/IMG GUIDE  08/08/2017  . IR US GUIDE VASC ACCESS RIGHT  04/07/2018   . MASTOIDECTOMY Left   . TONSILLECTOMY      Family History  Adopted: Yes  Family history unknown: Yes    Social History   Socioeconomic History  . Marital status: Married    Spouse name: Not on file  . Number of children: Not on file  . Years of education: Not on file  . Highest education level: Not on file  Occupational History  . Occupation: Retired  Scientific laboratory technician  . Financial resource strain: Not on file  . Food insecurity:    Worry: Not on file    Inability: Not on file  . Transportation needs:    Medical: Not on file    Non-medical: Not on file  Tobacco Use  . Smoking status: Former Smoker    Packs/day: 2.50    Years: 50.00    Pack years: 125.00    Types: Cigarettes    Last attempt to quit: 07/10/2014    Years since quitting: 3.7  . Smokeless tobacco: Never Used  . Tobacco comment: Smoked 2.5-3 PPD at highest amount.   Substance and Sexual Activity  . Alcohol use: Yes    Alcohol/week: 0.0 oz    Comment: 36 ozs of beer daily; last intake 4/15/12019   . Drug use: No  . Sexual activity: Not on file  Lifestyle  . Physical activity:    Days per week: Not on file    Minutes per session: Not on file  . Stress: Not on file  Relationships  . Social connections:    Talks on phone: Not on file    Gets together: Not on file    Attends religious service: Not on file    Active member of club or organization: Not on file    Attends meetings of clubs or organizations: Not on file    Relationship status: Not on file  . Intimate partner violence:    Fear of current or ex partner: Not on file    Emotionally abused: Not on file    Physically abused: Not on file    Forced sexual activity: Not on file  Other Topics Concern  . Not on file  Social History Narrative   Patient is adopted -- UNKNOWN family history    Past Medical History, Surgical history, Social history, and Family history were reviewed and updated as appropriate.   Please see review of systems for  further details on the patient's review from today.   Review of Systems:  Review of Systems  Constitutional: Positive for fatigue.  Respiratory: Positive for shortness of breath.   Musculoskeletal: Positive for arthralgias.  Neurological: Positive for weakness. Negative for syncope.    Objective:   Physical Exam:  There were no vitals taken for this visit. ECOG: 2  Physical Exam  Constitutional: No distress.  The patient is a frail chronically ill-appearing male who appears to be in no acute distress.  He was examined from the infusion chair and chemotherapy today.  Musculoskeletal: He exhibits tenderness. He exhibits no edema or deformity.       Arms: Skin: Skin is warm and dry. He is not diaphoretic.    Lab Review:     Component Value Date/Time   NA 136 04/21/2018 0754   NA 138 12/26/2017 1118   K 3.6 04/21/2018 0754   K 3.8 12/26/2017 1118   CL 93 (L) 04/21/2018 0754   CO2 34 (H) 04/21/2018 0754   CO2 36 (H) 12/26/2017 1118   GLUCOSE 148 (H) 04/21/2018 0754   GLUCOSE 98 12/26/2017 1118   BUN 21 04/21/2018 0754   BUN 16.5 12/26/2017 1118   CREATININE 0.78 04/21/2018 0754   CREATININE 0.7 12/26/2017 1118   CALCIUM 10.7 (H) 04/21/2018 0754   CALCIUM 9.5 12/26/2017 1118   PROT 7.1 04/21/2018 0754   PROT 6.1 (L) 12/26/2017 1118   ALBUMIN 3.9 04/21/2018 0754   ALBUMIN 3.4 (L) 12/26/2017 1118   AST 45 (H) 04/21/2018 0754   AST 15 12/26/2017 1118   ALT 12 04/21/2018 0754   ALT 13 12/26/2017 1118   ALKPHOS 89 04/21/2018 0754   ALKPHOS 106 12/26/2017 1118   BILITOT 0.2 04/21/2018 0754   BILITOT 0.52 12/26/2017 1118   GFRNONAA >60 04/21/2018 0754   GFRAA >60 04/21/2018 0754       Component Value Date/Time   WBC 5.7 04/21/2018 0754   WBC 8.5 04/07/2018 1259   RBC 3.60 (L) 04/21/2018 0754   HGB 10.9 (L) 04/21/2018 0754   HGB 5.9 (LL) 12/26/2017 1119   HCT 33.9 (L) 04/21/2018 0754   HCT 18.5 (L) 12/26/2017 1119   PLT 200 04/21/2018 0754   PLT 78 (L) 12/26/2017  1119   MCV 94.2 04/21/2018 0754   MCV 102.8 (H) 12/26/2017 1119   MCH 30.3 04/21/2018 0754   MCHC 32.2 04/21/2018 0754   RDW 14.1 04/21/2018 0754   RDW 20.3 (H) 12/26/2017 1119   LYMPHSABS 1.7 04/21/2018 0754   LYMPHSABS 0.6 (L) 12/26/2017 1119   MONOABS 0.3 04/21/2018 0754   MONOABS 0.1 12/26/2017 1119   EOSABS 0.0 04/21/2018 0754   EOSABS 0.0 12/26/2017 1119   BASOSABS 0.0 04/21/2018 0754   BASOSABS 0.0 12/26/2017 1119   -------------------------------  Imaging from last 24 hours (if applicable):  Radiology interpretation: Dg Scapula Right  Result Date: 04/21/2018 CLINICAL DATA:  72 year old male status post fall yesterday at home with severe right shoulder pain. EXAM: RIGHT SCAPULA - 2+ VIEWS COMPARISON:  Right humerus series today reported separately. Chest CT 04/03/2018, and earlier. FINDINGS: No glenohumeral joint dislocation. The right clavicle, scapula, and proximal right humerus appear intact. A right chest power port has been placed since 04/03/2018. A chronic right posterior 8th rib fracture is redemonstrated. IMPRESSION: No acute fracture or dislocation identified about the right shoulder. Electronically Signed   By: Genevie Ann M.D.   On: 04/21/2018 11:26   Dg Shoulder Right  Result Date: 04/21/2018 CLINICAL DATA:  [place] yesterday with severe right shoulder pain, patient is in a wheelchair EXAM: RIGHT SHOULDER - 2+ VIEW COMPARISON:  Chest x-ray of 12/08/2017 FINDINGS: The right humeral head appears to be in normal position with normal glenohumeral joint space. There is some sclerosis at the insertion of the rotator cuff which could indicate chronic rotator cuff disease. The right [facility] joint is normally aligned. Right-sided Port-A-Cath is present. The ribs that are visualized and are intact with old healed fracture of the right  posterolateral eighth rib. IMPRESSION: 1. No acute fracture. Cannot exclude chronic abnormality of the right rotator cuff. 2. Old fracture of the right  posterior eighth rib. No acute rib fracture is seen. Electronically Signed   By: Ivar Drape M.D.   On: 04/21/2018 11:24   Ct Chest W Contrast  Result Date: 04/03/2018 CLINICAL DATA:  Ongoing chemotherapy for left lung cancer. Chronic shortness of breath. Restaging. EXAM: CT CHEST, ABDOMEN, AND PELVIS WITH CONTRAST TECHNIQUE: Multidetector CT imaging of the chest, abdomen and pelvis was performed following the standard protocol during bolus administration of intravenous contrast. CONTRAST:  59mL OMNIPAQUE IOHEXOL 300 MG/ML  SOLN COMPARISON:  01/30/2018 FINDINGS: CT CHEST FINDINGS Cardiovascular: Coronary, aortic arch, and branch vessel atherosclerotic vascular disease. Mild cardiomegaly. Main pulmonary artery 3.9 cm in diameter. Mediastinum/Nodes: Contrast medium throughout the thoracic esophagus compatible with dysmotility or reflux. Prevascular node 1.1 cm in short axis, previously 0.3 cm. Separate prevascular lymph node 0.8 cm in short axis on image 28/2, previously 0.3 cm. Posterior mediastinal/paraspinal tumor to the left of the L3 vertebral body measures 1.1 by 2.2 cm, minimal nodularity previously in this vicinity at 0.9 by 0.6 cm. Posterior mediastinal and paraspinal tumor to the left of the L4 vertebral body measures 3.1 by 1.9 cm, previously about 5 mm in thickness. There is also progressive pleural nodularity posterior to the descending thoracic aorta suspicious for potential tumor deposits. Small bilateral axillary lymph nodes. Lungs/Pleura: Bilateral pleural thickening and chronic left pleural effusion, with calcifications along the pleural thickening bilaterally, previously attributed to fiber thoraces. Similar appearance of peripheral consolidation and rounded density along the left upper lobe and left lower lobe attributed to rounded atelectasis. Prominent centrilobular emphysema sub solid right upper lobe nodule 1.2 by 0.6 cm on image 44/6, previously the same size but previously slightly more  solid-appearing. Stable 5 mm right upper lobe nodule anteriorly on image 55/6. Additional stable mild scattered nodularity in the right upper lobe and right middle lobe. A right lower lobe lesion measuring 1.4 by 0.8 cm on image 109/6 was previously 1.1 by 0.7 cm. Musculoskeletal: Bilateral rib metastatic lesions are mostly stable, aside from the left posterior seventh rib metastatic lesion which appears more moth-eaten than it did previously. Right scapular and clavicular metastatic lesions appear stable as does the mid sternal lesion. The T10 metastatic lesion has some mild associated posterior cortical discontinuity and possible early epidural extension of tumor. CT ABDOMEN PELVIS FINDINGS Hepatobiliary: Stable small cyst in segment IVb of the liver. No new liver lesion is identified. Pancreas: 1.1 by 0.7 cm cystic lesion in the head of the pancreas appears similar to the prior CT examination. Connectivity to the dorsal pancreatic duct is uncertain but possible. Spleen: Unremarkable Adrenals/Urinary Tract: There is a stable left adrenal gland lateral limb adenoma, but there is a new right adrenal 1.2 by 1.3 cm mass on image 64/2 suspicious for a metastatic lesion. Right adrenal gland unremarkable. Vascular calcifications in the renal hila. No hydronephrosis or hydroureter. Stomach/Bowel: Sigmoid colon diverticulosis. Oral contrast medium extends through to the descending colon. No appreciable dilated bowel. Vascular/Lymphatic: Aortoiliac atherosclerotic vascular disease. There is new and progressive retroperitoneal adenopathy. Left periaortic lymph node at the level of the renal artery on image 66/2 measures 1.5 cm in short axis. Left periaortic lymph node below this level on image 71/2 measures 2.0 cm in short axis. Reproductive: Unremarkable Other: Paucity of intra-abdominal adipose tissue. Musculoskeletal: There is a greater degree of lucency associated with the metastatic lesion in  the L1 vertebral body  eccentric to the right on image 62/2. This lesion also appears to extend through the posterior cortex and into the anterior epidural space on image 62/2, which is new. The metastatic lesions at L3 have enlarged and ground more confluent, with new posterior bony destruction of the L3 vertebral body and epidural extension of tumor on image 76/2. IMPRESSION: 1. Progressive malignancy, with new prevascular and retroperitoneal adenopathy; progression of osseous metastatic disease with suspicion for potential epidural tumor involvement now at T10, L1, and L3; new left thoracic paraspinal tumor deposits; a suspected new metastatic lesion to the medial limb of the left adrenal gland, and mild enlargement of a right lower lobe pulmonary nodule currently 1.4 by 0.8 cm. Increased moth-eaten appearance of the metastatic lesion in the left seventh rib. The remainder of the bony lesions appear stable. 2. 1.1 by 0.7 cm cystic lesion in the head of the pancreas. This could possibly be a small intraductal papillary mucinous neoplasm or a postinflammatory cystic lesion. I would suggest surveillance of this lesion in the context of the patient's lung cancer imaging. 3. Other imaging findings of potential clinical significance: Aortic Atherosclerosis (ICD10-I70.0) and Emphysema (ICD10-J43.9). Coronary atherosclerosis with mild cardiomegaly. Mildly prominent pulmonary artery likely from secondary pulmonary arterial hypertension. Esophageal dysmotility or reflux. Chronic bilateral fibrothorax with suspected rounded atelectasis in the lingula and left lower lobe. 4. Left adrenal adenoma. 5. Sigmoid colon diverticulosis. Electronically Signed   By: Van Clines M.D.   On: 04/03/2018 15:25   Ct Abdomen Pelvis W Contrast  Result Date: 04/03/2018 CLINICAL DATA:  Ongoing chemotherapy for left lung cancer. Chronic shortness of breath. Restaging. EXAM: CT CHEST, ABDOMEN, AND PELVIS WITH CONTRAST TECHNIQUE: Multidetector CT imaging of  the chest, abdomen and pelvis was performed following the standard protocol during bolus administration of intravenous contrast. CONTRAST:  70mL OMNIPAQUE IOHEXOL 300 MG/ML  SOLN COMPARISON:  01/30/2018 FINDINGS: CT CHEST FINDINGS Cardiovascular: Coronary, aortic arch, and branch vessel atherosclerotic vascular disease. Mild cardiomegaly. Main pulmonary artery 3.9 cm in diameter. Mediastinum/Nodes: Contrast medium throughout the thoracic esophagus compatible with dysmotility or reflux. Prevascular node 1.1 cm in short axis, previously 0.3 cm. Separate prevascular lymph node 0.8 cm in short axis on image 28/2, previously 0.3 cm. Posterior mediastinal/paraspinal tumor to the left of the L3 vertebral body measures 1.1 by 2.2 cm, minimal nodularity previously in this vicinity at 0.9 by 0.6 cm. Posterior mediastinal and paraspinal tumor to the left of the L4 vertebral body measures 3.1 by 1.9 cm, previously about 5 mm in thickness. There is also progressive pleural nodularity posterior to the descending thoracic aorta suspicious for potential tumor deposits. Small bilateral axillary lymph nodes. Lungs/Pleura: Bilateral pleural thickening and chronic left pleural effusion, with calcifications along the pleural thickening bilaterally, previously attributed to fiber thoraces. Similar appearance of peripheral consolidation and rounded density along the left upper lobe and left lower lobe attributed to rounded atelectasis. Prominent centrilobular emphysema sub solid right upper lobe nodule 1.2 by 0.6 cm on image 44/6, previously the same size but previously slightly more solid-appearing. Stable 5 mm right upper lobe nodule anteriorly on image 55/6. Additional stable mild scattered nodularity in the right upper lobe and right middle lobe. A right lower lobe lesion measuring 1.4 by 0.8 cm on image 109/6 was previously 1.1 by 0.7 cm. Musculoskeletal: Bilateral rib metastatic lesions are mostly stable, aside from the left  posterior seventh rib metastatic lesion which appears more moth-eaten than it did previously. Right scapular and  clavicular metastatic lesions appear stable as does the mid sternal lesion. The T10 metastatic lesion has some mild associated posterior cortical discontinuity and possible early epidural extension of tumor. CT ABDOMEN PELVIS FINDINGS Hepatobiliary: Stable small cyst in segment IVb of the liver. No new liver lesion is identified. Pancreas: 1.1 by 0.7 cm cystic lesion in the head of the pancreas appears similar to the prior CT examination. Connectivity to the dorsal pancreatic duct is uncertain but possible. Spleen: Unremarkable Adrenals/Urinary Tract: There is a stable left adrenal gland lateral limb adenoma, but there is a new right adrenal 1.2 by 1.3 cm mass on image 64/2 suspicious for a metastatic lesion. Right adrenal gland unremarkable. Vascular calcifications in the renal hila. No hydronephrosis or hydroureter. Stomach/Bowel: Sigmoid colon diverticulosis. Oral contrast medium extends through to the descending colon. No appreciable dilated bowel. Vascular/Lymphatic: Aortoiliac atherosclerotic vascular disease. There is new and progressive retroperitoneal adenopathy. Left periaortic lymph node at the level of the renal artery on image 66/2 measures 1.5 cm in short axis. Left periaortic lymph node below this level on image 71/2 measures 2.0 cm in short axis. Reproductive: Unremarkable Other: Paucity of intra-abdominal adipose tissue. Musculoskeletal: There is a greater degree of lucency associated with the metastatic lesion in the L1 vertebral body eccentric to the right on image 62/2. This lesion also appears to extend through the posterior cortex and into the anterior epidural space on image 62/2, which is new. The metastatic lesions at L3 have enlarged and ground more confluent, with new posterior bony destruction of the L3 vertebral body and epidural extension of tumor on image 76/2. IMPRESSION:  1. Progressive malignancy, with new prevascular and retroperitoneal adenopathy; progression of osseous metastatic disease with suspicion for potential epidural tumor involvement now at T10, L1, and L3; new left thoracic paraspinal tumor deposits; a suspected new metastatic lesion to the medial limb of the left adrenal gland, and mild enlargement of a right lower lobe pulmonary nodule currently 1.4 by 0.8 cm. Increased moth-eaten appearance of the metastatic lesion in the left seventh rib. The remainder of the bony lesions appear stable. 2. 1.1 by 0.7 cm cystic lesion in the head of the pancreas. This could possibly be a small intraductal papillary mucinous neoplasm or a postinflammatory cystic lesion. I would suggest surveillance of this lesion in the context of the patient's lung cancer imaging. 3. Other imaging findings of potential clinical significance: Aortic Atherosclerosis (ICD10-I70.0) and Emphysema (ICD10-J43.9). Coronary atherosclerosis with mild cardiomegaly. Mildly prominent pulmonary artery likely from secondary pulmonary arterial hypertension. Esophageal dysmotility or reflux. Chronic bilateral fibrothorax with suspected rounded atelectasis in the lingula and left lower lobe. 4. Left adrenal adenoma. 5. Sigmoid colon diverticulosis. Electronically Signed   By: Van Clines M.D.   On: 04/03/2018 15:25   Ir US Guide Vasc Access Right  Result Date: 04/07/2018 INDICATION: 72 year old with metastatic small cell lung cancer. Port-A-Cath needed for treatment. EXAM: FLUOROSCOPIC AND ULTRASOUND GUIDED PLACEMENT OF A SUBCUTANEOUS PORT COMPARISON:  None. MEDICATIONS: Ancef 2 g; The antibiotic was administered within an appropriate time interval prior to skin puncture. ANESTHESIA/SEDATION: Versed 1.5 mg IV; Fentanyl 75 mcg IV; Moderate Sedation Time:  30 minutes The patient was continuously monitored during the procedure by the interventional radiology nurse under my direct supervision. FLUOROSCOPY TIME:   18 seconds, 1 mGy COMPLICATIONS: None immediate. PROCEDURE: The procedure, risks, benefits, and alternatives were explained to the patient. Questions regarding the procedure were encouraged and answered. The patient understands and consents to the procedure. Patient was  placed supine on the interventional table. Ultrasound confirmed a patent right internal jugular vein. The right chest and neck were cleaned with a skin antiseptic and a sterile drape was placed. Maximal barrier sterile technique was utilized including caps, mask, sterile gowns, sterile gloves, sterile drape, hand hygiene and skin antiseptic. The right neck was anesthetized with 1% lidocaine. Small incision was made in the right neck with a blade. Micropuncture set was placed in the right internal jugular vein with ultrasound guidance. The micropuncture wire was used for measurement purposes. The right chest was anesthetized with 1% lidocaine with epinephrine. #15 blade was used to make an incision and a subcutaneous port pocket was formed. 8 french [employer] was assembled. Subcutaneous tunnel was formed with a stiff tunneling device. The port catheter was brought through the subcutaneous tunnel. The port was placed in the subcutaneous pocket. The micropuncture set was exchanged for a peel-away sheath. The catheter was placed through the peel-away sheath and the tip was positioned at the superior cavoatrial junction. Catheter placement was confirmed with fluoroscopy. The port was accessed and flushed with heparinized saline. The port pocket was closed using two layers of absorbable sutures and Dermabond. The vein skin site was closed using a single layer of absorbable suture and Dermabond. Sterile dressings were applied. Patient tolerated the procedure well without an immediate complication. Ultrasound confirmed a patent right internal jugular vein. Image was saved for documentation. Fluoroscopic images were taken and saved for this procedure.  IMPRESSION: Placement of a subcutaneous port device. This is a CT injectable Port-A-Cath. Electronically Signed   By: Markus Daft M.D.   On: 04/07/2018 16:17   Dg Humerus Right  Result Date: 04/21/2018 CLINICAL DATA:  Severe right shoulder pain after fall yesterday. EXAM: RIGHT HUMERUS - 2+ VIEW COMPARISON:  None. FINDINGS: There is no evidence of fracture or other focal bone lesions. Old right eighth and ninth posterior rib fractures. Severe osteopenia. Soft tissues are unremarkable. IMPRESSION: Negative. Electronically Signed   By: Titus Dubin M.D.   On: 04/21/2018 11:26   Ir Fluoro Guide Port Insertion Right  Result Date: 04/07/2018 INDICATION: 72 year old with metastatic small cell lung cancer. Port-A-Cath needed for treatment. EXAM: FLUOROSCOPIC AND ULTRASOUND GUIDED PLACEMENT OF A SUBCUTANEOUS PORT COMPARISON:  None. MEDICATIONS: Ancef 2 g; The antibiotic was administered within an appropriate time interval prior to skin puncture. ANESTHESIA/SEDATION: Versed 1.5 mg IV; Fentanyl 75 mcg IV; Moderate Sedation Time:  30 minutes The patient was continuously monitored during the procedure by the interventional radiology nurse under my direct supervision. FLUOROSCOPY TIME:  18 seconds, 1 mGy COMPLICATIONS: None immediate. PROCEDURE: The procedure, risks, benefits, and alternatives were explained to the patient. Questions regarding the procedure were encouraged and answered. The patient understands and consents to the procedure. Patient was placed supine on the interventional table. Ultrasound confirmed a patent right internal jugular vein. The right chest and neck were cleaned with a skin antiseptic and a sterile drape was placed. Maximal barrier sterile technique was utilized including caps, mask, sterile gowns, sterile gloves, sterile drape, hand hygiene and skin antiseptic. The right neck was anesthetized with 1% lidocaine. Small incision was made in the right neck with a blade. Micropuncture set was  placed in the right internal jugular vein with ultrasound guidance. The micropuncture wire was used for measurement purposes. The right chest was anesthetized with 1% lidocaine with epinephrine. #15 blade was used to make an incision and a subcutaneous port pocket was formed. [place]  was assembled. Subcutaneous tunnel was formed with a stiff tunneling device. The port catheter was brought through the subcutaneous tunnel. The port was placed in the subcutaneous pocket. The micropuncture set was exchanged for a peel-away sheath. The catheter was placed through the peel-away sheath and the tip was positioned at the superior cavoatrial junction. Catheter placement was confirmed with fluoroscopy. The port was accessed and flushed with heparinized saline. The port pocket was closed using two layers of absorbable sutures and Dermabond. The vein skin site was closed using a single layer of absorbable suture and Dermabond. Sterile dressings were applied. Patient tolerated the procedure well without an immediate complication. Ultrasound confirmed a patent right internal jugular vein. Image was saved for documentation. Fluoroscopic images were taken and saved for this procedure. IMPRESSION: Placement of a subcutaneous port device. This is a CT injectable Port-A-Cath. Electronically Signed   By: Markus Daft M.D.   On: 04/07/2018 16:17        This case was discussed with Dr. Julien Nordmann. He expressed agreement with my management of this patient.

## 2018-04-28 ENCOUNTER — Inpatient Hospital Stay: Payer: Medicare Other

## 2018-04-28 ENCOUNTER — Other Ambulatory Visit: Payer: Self-pay

## 2018-04-28 ENCOUNTER — Ambulatory Visit: Payer: Medicare Other | Admitting: Internal Medicine

## 2018-04-28 ENCOUNTER — Inpatient Hospital Stay (HOSPITAL_BASED_OUTPATIENT_CLINIC_OR_DEPARTMENT_OTHER): Payer: Medicare Other | Admitting: Oncology

## 2018-04-28 ENCOUNTER — Other Ambulatory Visit: Payer: Medicare Other

## 2018-04-28 ENCOUNTER — Encounter: Payer: Self-pay | Admitting: Oncology

## 2018-04-28 ENCOUNTER — Inpatient Hospital Stay: Payer: Medicare Other | Attending: Oncology

## 2018-04-28 VITALS — BP 114/72 | HR 100 | Temp 97.7°F | Resp 18 | Ht 67.75 in | Wt 99.8 lb

## 2018-04-28 DIAGNOSIS — D61818 Other pancytopenia: Secondary | ICD-10-CM | POA: Diagnosis not present

## 2018-04-28 DIAGNOSIS — D6481 Anemia due to antineoplastic chemotherapy: Secondary | ICD-10-CM

## 2018-04-28 DIAGNOSIS — Z7189 Other specified counseling: Secondary | ICD-10-CM

## 2018-04-28 DIAGNOSIS — K439 Ventral hernia without obstruction or gangrene: Secondary | ICD-10-CM | POA: Insufficient documentation

## 2018-04-28 DIAGNOSIS — C3492 Malignant neoplasm of unspecified part of left bronchus or lung: Secondary | ICD-10-CM | POA: Insufficient documentation

## 2018-04-28 DIAGNOSIS — C786 Secondary malignant neoplasm of retroperitoneum and peritoneum: Secondary | ICD-10-CM | POA: Insufficient documentation

## 2018-04-28 DIAGNOSIS — R5383 Other fatigue: Secondary | ICD-10-CM | POA: Insufficient documentation

## 2018-04-28 DIAGNOSIS — R634 Abnormal weight loss: Secondary | ICD-10-CM

## 2018-04-28 DIAGNOSIS — I272 Pulmonary hypertension, unspecified: Secondary | ICD-10-CM | POA: Insufficient documentation

## 2018-04-28 DIAGNOSIS — Z5111 Encounter for antineoplastic chemotherapy: Secondary | ICD-10-CM | POA: Diagnosis not present

## 2018-04-28 DIAGNOSIS — Z9981 Dependence on supplemental oxygen: Secondary | ICD-10-CM | POA: Insufficient documentation

## 2018-04-28 DIAGNOSIS — I110 Hypertensive heart disease with heart failure: Secondary | ICD-10-CM | POA: Diagnosis not present

## 2018-04-28 DIAGNOSIS — Z79899 Other long term (current) drug therapy: Secondary | ICD-10-CM

## 2018-04-28 DIAGNOSIS — C7951 Secondary malignant neoplasm of bone: Secondary | ICD-10-CM | POA: Insufficient documentation

## 2018-04-28 DIAGNOSIS — R630 Anorexia: Secondary | ICD-10-CM | POA: Insufficient documentation

## 2018-04-28 DIAGNOSIS — F419 Anxiety disorder, unspecified: Secondary | ICD-10-CM | POA: Insufficient documentation

## 2018-04-28 DIAGNOSIS — Z95828 Presence of other vascular implants and grafts: Secondary | ICD-10-CM

## 2018-04-28 DIAGNOSIS — T451X5A Adverse effect of antineoplastic and immunosuppressive drugs, initial encounter: Secondary | ICD-10-CM

## 2018-04-28 DIAGNOSIS — I509 Heart failure, unspecified: Secondary | ICD-10-CM | POA: Insufficient documentation

## 2018-04-28 LAB — CBC WITH DIFFERENTIAL (CANCER CENTER ONLY)
Basophils Absolute: 0 10*3/uL (ref 0.0–0.1)
Basophils Relative: 0 %
EOS ABS: 0 10*3/uL (ref 0.0–0.5)
Eosinophils Relative: 1 %
HCT: 31.4 % — ABNORMAL LOW (ref 38.4–49.9)
HEMOGLOBIN: 10.3 g/dL — AB (ref 13.0–17.1)
LYMPHS ABS: 1.7 10*3/uL (ref 0.9–3.3)
Lymphocytes Relative: 41 %
MCH: 29.8 pg (ref 27.2–33.4)
MCHC: 32.7 g/dL (ref 32.0–36.0)
MCV: 91.1 fL (ref 79.3–98.0)
MONO ABS: 0.3 10*3/uL (ref 0.1–0.9)
MONOS PCT: 6 %
NEUTROS PCT: 52 %
Neutro Abs: 2.1 10*3/uL (ref 1.5–6.5)
Platelet Count: 140 10*3/uL (ref 140–400)
RBC: 3.45 MIL/uL — ABNORMAL LOW (ref 4.20–5.82)
RDW: 14.8 % — AB (ref 11.0–14.6)
WBC Count: 4.1 10*3/uL (ref 4.0–10.3)

## 2018-04-28 LAB — CMP (CANCER CENTER ONLY)
ALBUMIN: 3.8 g/dL (ref 3.5–5.0)
ALT: 11 U/L (ref 0–55)
AST: 51 U/L — AB (ref 5–34)
Alkaline Phosphatase: 97 U/L (ref 40–150)
Anion gap: 5 (ref 3–11)
BUN: 20 mg/dL (ref 7–26)
CHLORIDE: 96 mmol/L — AB (ref 98–109)
CO2: 37 mmol/L — AB (ref 22–29)
CREATININE: 0.73 mg/dL (ref 0.70–1.30)
Calcium: 10.7 mg/dL — ABNORMAL HIGH (ref 8.4–10.4)
GFR, Est AFR Am: >60 mL/min (ref >60)
Glucose, Bld: 139 mg/dL (ref 70–140)
Potassium: 3.4 mmol/L — ABNORMAL LOW (ref 3.5–5.1)
SODIUM: 138 mmol/L (ref 136–145)
Total Bilirubin: 0.2 mg/dL (ref 0.2–1.2)
Total Protein: 6.8 g/dL (ref 6.4–8.3)

## 2018-04-28 MED ORDER — HEPARIN SOD (PORK) LOCK FLUSH 100 UNIT/ML IV SOLN
500.0000 [IU] | Freq: Once | INTRAVENOUS | Status: AC | PRN
  Administered 2018-04-28: 500 [IU]
  Filled 2018-04-28: qty 5

## 2018-04-28 MED ORDER — PROCHLORPERAZINE MALEATE 10 MG PO TABS
10.0000 mg | ORAL_TABLET | Freq: Once | ORAL | Status: AC
  Administered 2018-04-28: 10 mg via ORAL

## 2018-04-28 MED ORDER — SODIUM CHLORIDE 0.9% FLUSH
10.0000 mL | INTRAVENOUS | Status: DC | PRN
  Administered 2018-04-28: 10 mL via INTRAVENOUS
  Filled 2018-04-28: qty 10

## 2018-04-28 MED ORDER — METHYLPREDNISOLONE 4 MG PO TABS: ORAL_TABLET | ORAL | 0 refills | Status: AC

## 2018-04-28 MED ORDER — PROCHLORPERAZINE MALEATE 10 MG PO TABS
ORAL_TABLET | ORAL | Status: AC
  Filled 2018-04-28: qty 1

## 2018-04-28 MED ORDER — SODIUM CHLORIDE 0.9% FLUSH
10.0000 mL | INTRAVENOUS | Status: DC | PRN
  Administered 2018-04-28: 10 mL
  Filled 2018-04-28: qty 10

## 2018-04-28 MED ORDER — SODIUM CHLORIDE 0.9 % IV SOLN
Freq: Once | INTRAVENOUS | Status: AC
  Administered 2018-04-28: 11:00:00 via INTRAVENOUS

## 2018-04-28 MED ORDER — TOPOTECAN HCL CHEMO INJECTION 4 MG
1.9500 mg/m2 | Freq: Once | INTRAVENOUS | Status: AC
  Administered 2018-04-28: 3 mg via INTRAVENOUS
  Filled 2018-04-28: qty 3

## 2018-04-28 NOTE — Assessment & Plan Note (Signed)
This is a very pleasant 72 year old white male recently diagnosed with extensive stage small cell lung cancer. The patient completed 6 cycles of systemic chemotherapy with carboplatin and etoposide. He tolerated this treatment well except for increasing fatigue and pancytopenia. The patient was then on observation for 2 months. The restaging CT scan showed evidence for disease progression in multiple areas including mediastinal lymph nodes as well as bone and retroperitoneal metastasis. Patient is currently on treatment with weekly topotecan at a dose of 2.0 mg/M2.  Status post 2 cycles which he tolerated fairly well with the exception of fatigue, decreased appetite, and weight loss.  The patient was seen with Dr. Earlie Server.  We again discussed proceeding with treatment versus considering referral for palliative care/hospice.  The patient does not want to stop treatment.  Recommend that he proceed with cycle 3 of his treatment as scheduled today. He will return next week for labs and chemotherapy and have a follow-up visit in 2 weeks for evaluation prior to cycle #5. Plan is for restaging CT scan after 6 to 8 weeks of treatment.  For weight loss, he was given a prescription for Medrol Dosepak.  For the anemia of chronic disease, we will continue to monitor his hemoglobin and hematocrit closely and consider the patient for transfusion if needed.  The patient was advised to call immediately if he has any concerning symptoms in the interval. The patient voices understanding of current disease status and treatment options and is in agreement with the current care plan. All questions were answered. The patient knows to call the clinic with any problems, questions or concerns. We can certainly see the patient much sooner if necessary.

## 2018-04-28 NOTE — Patient Instructions (Signed)
[facility] Discharge Instructions for Patients Receiving Chemotherapy  Today you received the following chemotherapy agents: Hycamtin  To help prevent nausea and vomiting after your treatment, we encourage you to take your nausea medication as directed.   If you develop nausea and vomiting that is not controlled by your nausea medication, call the clinic.   BELOW ARE SYMPTOMS THAT SHOULD BE REPORTED IMMEDIATELY:  *FEVER GREATER THAN 100.5 F  *CHILLS WITH OR WITHOUT FEVER  NAUSEA AND VOMITING THAT IS NOT CONTROLLED WITH YOUR NAUSEA MEDICATION  *UNUSUAL SHORTNESS OF BREATH  *UNUSUAL BRUISING OR BLEEDING  TENDERNESS IN MOUTH AND THROAT WITH OR WITHOUT PRESENCE OF ULCERS  *URINARY PROBLEMS  *BOWEL PROBLEMS  UNUSUAL RASH Items with * indicate a potential emergency and should be followed up as soon as possible.  Feel free to call the clinic should you have any questions or concerns. The clinic phone number is [phone].  Please show the [facility] at check-in to the Emergency Department and triage nurse.

## 2018-04-28 NOTE — Progress Notes (Signed)
[place] OFFICE PROGRESS NOTE  [name], PA-C [address] [place] [address]  DIAGNOSIS: Extensive stage (T3, N3, M1c) small cell lung cancer presented with widespread malignancy including nodal stations, bones, left pleural space and lungs diagnosed in September 2018.  PRIOR THERAPY: Systemic chemotherapy with carboplatin for AUC 5 on day 1 and etoposide 100 mg/M2 on days 1, 2 and 3 with Neulasta support. Status post 6 cycles with partial response.  CURRENT THERAPY: Second line chemotherapy with reduced dose weekly Topotecan 2.0 mg/M2.  First dose April 14, 2018.  Status post 2 cycles.  INTERVAL HISTORY: Jose Mckinney 72 y.o. male returns for routine follow-up visit accompanied by his wife.  The patient is having more fatigue, decreased appetite, and weight loss.  He feels though he is tolerating his treatment with Topotecan fairly well otherwise.  The patient denies fevers and chills.  Denies chest pain, cough, hemoptysis.  He has baseline shortness of breath and wears home oxygen.  He had one episode of nausea and vomiting this morning but none prior to this episode.  Denies constipation diarrhea.  He is not using the Marinol it has been prescribed to him because he did not think it was working.  The patient is here for evaluation prior to cycle 3 of his treatment.  MEDICAL HISTORY: Past Medical History:  Diagnosis Date  . Anxiety   . CHF (congestive heart failure) (HCC)    x 3 episodes- [place]. pt. living here since 11-'1-16  . 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 Depression   . Dyspnea   . HTN (hypertension)   . On home oxygen therapy   . Pulmonary HTN (HCC)    Dr. Marlyn Corporal is following.  . Recurrent left pleural effusion   . Sciatic leg pain    INTERMITTENT- occ. uses Hydrocodone as needed  . Seasonal allergies   . Small cell lung cancer, left ([facility]) 09/15/2017    ALLERGIES:  has No Known Allergies.  MEDICATIONS:  Current  Outpatient Medications  Medication Sig Dispense Refill  . albuterol (PROVENTIL) (2.5 MG/3ML) 0.083% nebulizer solution Take 3 mLs (2.5 mg total) by nebulization every 6 (six) hours as needed for wheezing or shortness of breath. 75 mL 12  . feeding supplement (BOOST HIGH PROTEIN) LIQD Take 1 Container by mouth daily.     . fluticasone (FLONASE) 50 MCG/ACT nasal spray Place 2 sprays into both nostrils daily as needed for allergies or rhinitis.    Marland Kitchen ibuprofen (ADVIL,MOTRIN) 200 MG tablet Take 600 mg by mouth 3 (three) times daily as needed (pain).     Marland Kitchen ipratropium (ATROVENT) 0.02 % nebulizer solution Take 0.5 mg by nebulization every 4 (four) hours as needed for wheezing or shortness of breath.    . lidocaine-prilocaine (EMLA) cream Apply 1 application topically as needed. 30 g 0  . loratadine (CLARITIN) 10 MG tablet Take 10 mg by mouth daily as needed for allergies.    Marland Kitchen oxyCODONE-acetaminophen (PERCOCET/ROXICET) 5-325 MG tablet Take 1 tablet by mouth every 6 (six) hours as needed for severe pain. 30 tablet 0  . prochlorperazine (COMPAZINE) 10 MG tablet Take 1 tablet (10 mg total) by mouth every 6 (six) hours as needed for nausea or vomiting. 30 tablet 1  . sertraline (ZOLOFT) 50 MG tablet Take 50 mg by mouth 2 (two) times daily.    . sildenafil (REVATIO) 20 MG tablet Take 20 mg by mouth as directed.    . dronabinol (MARINOL)  2.5 MG capsule Take 1 capsule (2.5 mg total) by mouth 2 (two) times daily before a meal. (Patient not taking: Reported on 04/06/2018) 60 capsule 0  . HYDROcodone-acetaminophen (NORCO/VICODIN) 5-325 MG tablet Take 1 tablet by mouth every 12 (twelve) hours as needed for moderate pain.    . methylPREDNISolone (MEDROL) 4 MG tablet Take 6 tabs on day 1, 5 tabs on day 2, 4 tabs on day 3, 3 tabs on day 4, 2 tabs on day 5, 1 tab on day 6, and then stop 21 tablet 0   No current facility-administered medications for this visit.    Facility-Administered Medications Ordered in Other Visits   Medication Dose Route Frequency Provider Last Rate Last Dose  . sodium chloride flush (NS) 0.9 % injection 10 mL  10 mL Intracatheter PRN Curt Bears, MD   10 mL at 04/28/18 1144    SURGICAL HISTORY:  Past Surgical History:  Procedure Laterality Date  . ANGIOPLASTY    . CARDIAC CATHETERIZATION     '15- [place]- no issues now.  . COLONOSCOPY WITH PROPOFOL N/A 02/09/2016   Procedure: COLONOSCOPY WITH PROPOFOL;  Surgeon: Carol Ada, MD;  Location: [facility];  Service: Endoscopy;  Laterality: N/A;  . IR FLUORO GUIDE PORT INSERTION RIGHT  04/07/2018  . IR THORACENTESIS ASP PLEURAL SPACE W/IMG GUIDE  08/08/2017  . IR US GUIDE VASC ACCESS RIGHT  04/07/2018  . MASTOIDECTOMY Left   . TONSILLECTOMY      REVIEW OF SYSTEMS:   Review of Systems  Constitutional: Negative for chills, fever. Positive for fatigue, decreased appetite, weight loss. HENT:   Negative for mouth sores, nosebleeds, sore throat and trouble swallowing.   Eyes: Negative for eye problems and icterus.  Respiratory: Negative for cough, hemoptysis, and wheezing.  He has baseline shortness of breath and wears home oxygen. Cardiovascular: Negative for chest pain and leg swelling.  Gastrointestinal: Negative for abdominal pain, constipation, diarrhea.  Had one episode of nausea with vomiting this morning.  No other episodes of nausea vomiting reported.  Genitourinary: Negative for bladder incontinence, difficulty urinating, dysuria, frequency and hematuria.   Musculoskeletal: Positive for right shoulder pain.  Skin: Negative for itching and rash.  Neurological: Negative for dizziness, extremity weakness, headaches, light-headedness and seizures.  Hematological: Negative for adenopathy. Does not bruise/bleed easily.  Psychiatric/Behavioral: Negative for confusion, depression and sleep disturbance. The patient is not nervous/anxious.     PHYSICAL EXAMINATION:  Blood pressure 114/72, pulse 100, temperature 97.7 F  (36.5 C), temperature source Oral, resp. rate 18, height 5' 7.75" (1.721 m), weight 99 lb 12.8 oz (45.3 kg), SpO2 100 %.  ECOG PERFORMANCE STATUS: 2 - Symptomatic, <50% confined to bed  Physical Exam  Constitutional: Oriented to person, place, and time.  Cachectic.  No distress.  HENT:  Head: Normocephalic and atraumatic.  Mouth/Throat: Oropharynx is clear and moist. No oropharyngeal exudate.  Eyes: Conjunctivae are normal. Right eye exhibits no discharge. Left eye exhibits no discharge. No scleral icterus.  Neck: Normal range of motion. Neck supple.  Cardiovascular: Normal rate, regular rhythm, normal heart sounds and intact distal pulses.   Pulmonary/Chest: Effort normal and breath sounds normal. No respiratory distress. No wheezes. No rales.  Abdominal: Soft. Bowel sounds are normal. Exhibits no distension and no mass. There is no tenderness.  Musculoskeletal: Normal range of motion. Exhibits no edema.  Lymphadenopathy:    No cervical adenopathy.  Neurological: Alert and oriented to person, place, and time. Exhibits normal muscle tone. Coordination normal.  Skin:  Skin is warm and dry. No rash noted. Not diaphoretic. No erythema. No pallor.  Psychiatric: Mood, memory and judgment normal.  Vitals reviewed.  LABORATORY DATA: Lab Results  Component Value Date   WBC 4.1 04/28/2018   HGB 10.3 (L) 04/28/2018   HCT 31.4 (L) 04/28/2018   MCV 91.1 04/28/2018   PLT 140 04/28/2018      Chemistry      Component Value Date/Time   NA 138 04/28/2018 0902   NA 138 12/26/2017 1118   K 3.4 (L) 04/28/2018 0902   K 3.8 12/26/2017 1118   CL 96 (L) 04/28/2018 0902   CO2 37 (H) 04/28/2018 0902   CO2 36 (H) 12/26/2017 1118   BUN 20 04/28/2018 0902   BUN 16.5 12/26/2017 1118   CREATININE 0.73 04/28/2018 0902   CREATININE 0.7 12/26/2017 1118      Component Value Date/Time   CALCIUM 10.7 (H) 04/28/2018 0902   CALCIUM 9.5 12/26/2017 1118   ALKPHOS 97 04/28/2018 0902   ALKPHOS 106 12/26/2017  1118   AST 51 (H) 04/28/2018 0902   AST 15 12/26/2017 1118   ALT 11 04/28/2018 0902   ALT 13 12/26/2017 1118   BILITOT 0.2 04/28/2018 0902   BILITOT 0.52 12/26/2017 1118       RADIOGRAPHIC STUDIES:  Dg Scapula Right  Result Date: 04/21/2018 CLINICAL DATA:  72 year old male status post fall yesterday at home with severe right shoulder pain. EXAM: RIGHT SCAPULA - 2+ VIEWS COMPARISON:  Right humerus series today reported separately. Chest CT 04/03/2018, and earlier. FINDINGS: No glenohumeral joint dislocation. The right clavicle, scapula, and proximal right humerus appear intact. A right chest power port has been placed since 04/03/2018. A chronic right posterior 8th rib fracture is redemonstrated. IMPRESSION: No acute fracture or dislocation identified about the right shoulder. Electronically Signed   By: Genevie Ann M.D.   On: 04/21/2018 11:26   Dg Shoulder Right  Result Date: 04/21/2018 CLINICAL DATA:  [place] yesterday with severe right shoulder pain, patient is in a wheelchair EXAM: RIGHT SHOULDER - 2+ VIEW COMPARISON:  Chest x-ray of 12/08/2017 FINDINGS: The right humeral head appears to be in normal position with normal glenohumeral joint space. There is some sclerosis at the insertion of the rotator cuff which could indicate chronic rotator cuff disease. The right [facility] joint is normally aligned. Right-sided Port-A-Cath is present. The ribs that are visualized and are intact with old healed fracture of the right posterolateral eighth rib. IMPRESSION: 1. No acute fracture. Cannot exclude chronic abnormality of the right rotator cuff. 2. Old fracture of the right posterior eighth rib. No acute rib fracture is seen. Electronically Signed   By: Ivar Drape M.D.   On: 04/21/2018 11:24   Ct Chest W Contrast  Result Date: 04/03/2018 CLINICAL DATA:  Ongoing chemotherapy for left lung cancer. Chronic shortness of breath. Restaging. EXAM: CT CHEST, ABDOMEN, AND PELVIS WITH CONTRAST TECHNIQUE: Multidetector  CT imaging of the chest, abdomen and pelvis was performed following the standard protocol during bolus administration of intravenous contrast. CONTRAST:  53mL OMNIPAQUE IOHEXOL 300 MG/ML  SOLN COMPARISON:  01/30/2018 FINDINGS: CT CHEST FINDINGS Cardiovascular: Coronary, aortic arch, and branch vessel atherosclerotic vascular disease. Mild cardiomegaly. Main pulmonary artery 3.9 cm in diameter. Mediastinum/Nodes: Contrast medium throughout the thoracic esophagus compatible with dysmotility or reflux. Prevascular node 1.1 cm in short axis, previously 0.3 cm. Separate prevascular lymph node 0.8 cm in short axis on image 28/2, previously 0.3 cm. Posterior mediastinal/paraspinal tumor to the left  of the L3 vertebral body measures 1.1 by 2.2 cm, minimal nodularity previously in this vicinity at 0.9 by 0.6 cm. Posterior mediastinal and paraspinal tumor to the left of the L4 vertebral body measures 3.1 by 1.9 cm, previously about 5 mm in thickness. There is also progressive pleural nodularity posterior to the descending thoracic aorta suspicious for potential tumor deposits. Small bilateral axillary lymph nodes. Lungs/Pleura: Bilateral pleural thickening and chronic left pleural effusion, with calcifications along the pleural thickening bilaterally, previously attributed to fiber thoraces. Similar appearance of peripheral consolidation and rounded density along the left upper lobe and left lower lobe attributed to rounded atelectasis. Prominent centrilobular emphysema sub solid right upper lobe nodule 1.2 by 0.6 cm on image 44/6, previously the same size but previously slightly more solid-appearing. Stable 5 mm right upper lobe nodule anteriorly on image 55/6. Additional stable mild scattered nodularity in the right upper lobe and right middle lobe. A right lower lobe lesion measuring 1.4 by 0.8 cm on image 109/6 was previously 1.1 by 0.7 cm. Musculoskeletal: Bilateral rib metastatic lesions are mostly stable, aside from  the left posterior seventh rib metastatic lesion which appears more moth-eaten than it did previously. Right scapular and clavicular metastatic lesions appear stable as does the mid sternal lesion. The T10 metastatic lesion has some mild associated posterior cortical discontinuity and possible early epidural extension of tumor. CT ABDOMEN PELVIS FINDINGS Hepatobiliary: Stable small cyst in segment IVb of the liver. No new liver lesion is identified. Pancreas: 1.1 by 0.7 cm cystic lesion in the head of the pancreas appears similar to the prior CT examination. Connectivity to the dorsal pancreatic duct is uncertain but possible. Spleen: Unremarkable Adrenals/Urinary Tract: There is a stable left adrenal gland lateral limb adenoma, but there is a new right adrenal 1.2 by 1.3 cm mass on image 64/2 suspicious for a metastatic lesion. Right adrenal gland unremarkable. Vascular calcifications in the renal hila. No hydronephrosis or hydroureter. Stomach/Bowel: Sigmoid colon diverticulosis. Oral contrast medium extends through to the descending colon. No appreciable dilated bowel. Vascular/Lymphatic: Aortoiliac atherosclerotic vascular disease. There is new and progressive retroperitoneal adenopathy. Left periaortic lymph node at the level of the renal artery on image 66/2 measures 1.5 cm in short axis. Left periaortic lymph node below this level on image 71/2 measures 2.0 cm in short axis. Reproductive: Unremarkable Other: Paucity of intra-abdominal adipose tissue. Musculoskeletal: There is a greater degree of lucency associated with the metastatic lesion in the L1 vertebral body eccentric to the right on image 62/2. This lesion also appears to extend through the posterior cortex and into the anterior epidural space on image 62/2, which is new. The metastatic lesions at L3 have enlarged and ground more confluent, with new posterior bony destruction of the L3 vertebral body and epidural extension of tumor on image 76/2.  IMPRESSION: 1. Progressive malignancy, with new prevascular and retroperitoneal adenopathy; progression of osseous metastatic disease with suspicion for potential epidural tumor involvement now at T10, L1, and L3; new left thoracic paraspinal tumor deposits; a suspected new metastatic lesion to the medial limb of the left adrenal gland, and mild enlargement of a right lower lobe pulmonary nodule currently 1.4 by 0.8 cm. Increased moth-eaten appearance of the metastatic lesion in the left seventh rib. The remainder of the bony lesions appear stable. 2. 1.1 by 0.7 cm cystic lesion in the head of the pancreas. This could possibly be a small intraductal papillary mucinous neoplasm or a postinflammatory cystic lesion. I would suggest surveillance of  this lesion in the context of the patient's lung cancer imaging. 3. Other imaging findings of potential clinical significance: Aortic Atherosclerosis (ICD10-I70.0) and Emphysema (ICD10-J43.9). Coronary atherosclerosis with mild cardiomegaly. Mildly prominent pulmonary artery likely from secondary pulmonary arterial hypertension. Esophageal dysmotility or reflux. Chronic bilateral fibrothorax with suspected rounded atelectasis in the lingula and left lower lobe. 4. Left adrenal adenoma. 5. Sigmoid colon diverticulosis. Electronically Signed   By: Van Clines M.D.   On: 04/03/2018 15:25   Ct Abdomen Pelvis W Contrast  Result Date: 04/03/2018 CLINICAL DATA:  Ongoing chemotherapy for left lung cancer. Chronic shortness of breath. Restaging. EXAM: CT CHEST, ABDOMEN, AND PELVIS WITH CONTRAST TECHNIQUE: Multidetector CT imaging of the chest, abdomen and pelvis was performed following the standard protocol during bolus administration of intravenous contrast. CONTRAST:  29mL OMNIPAQUE IOHEXOL 300 MG/ML  SOLN COMPARISON:  01/30/2018 FINDINGS: CT CHEST FINDINGS Cardiovascular: Coronary, aortic arch, and branch vessel atherosclerotic vascular disease. Mild cardiomegaly. Main  pulmonary artery 3.9 cm in diameter. Mediastinum/Nodes: Contrast medium throughout the thoracic esophagus compatible with dysmotility or reflux. Prevascular node 1.1 cm in short axis, previously 0.3 cm. Separate prevascular lymph node 0.8 cm in short axis on image 28/2, previously 0.3 cm. Posterior mediastinal/paraspinal tumor to the left of the L3 vertebral body measures 1.1 by 2.2 cm, minimal nodularity previously in this vicinity at 0.9 by 0.6 cm. Posterior mediastinal and paraspinal tumor to the left of the L4 vertebral body measures 3.1 by 1.9 cm, previously about 5 mm in thickness. There is also progressive pleural nodularity posterior to the descending thoracic aorta suspicious for potential tumor deposits. Small bilateral axillary lymph nodes. Lungs/Pleura: Bilateral pleural thickening and chronic left pleural effusion, with calcifications along the pleural thickening bilaterally, previously attributed to fiber thoraces. Similar appearance of peripheral consolidation and rounded density along the left upper lobe and left lower lobe attributed to rounded atelectasis. Prominent centrilobular emphysema sub solid right upper lobe nodule 1.2 by 0.6 cm on image 44/6, previously the same size but previously slightly more solid-appearing. Stable 5 mm right upper lobe nodule anteriorly on image 55/6. Additional stable mild scattered nodularity in the right upper lobe and right middle lobe. A right lower lobe lesion measuring 1.4 by 0.8 cm on image 109/6 was previously 1.1 by 0.7 cm. Musculoskeletal: Bilateral rib metastatic lesions are mostly stable, aside from the left posterior seventh rib metastatic lesion which appears more moth-eaten than it did previously. Right scapular and clavicular metastatic lesions appear stable as does the mid sternal lesion. The T10 metastatic lesion has some mild associated posterior cortical discontinuity and possible early epidural extension of tumor. CT ABDOMEN PELVIS FINDINGS  Hepatobiliary: Stable small cyst in segment IVb of the liver. No new liver lesion is identified. Pancreas: 1.1 by 0.7 cm cystic lesion in the head of the pancreas appears similar to the prior CT examination. Connectivity to the dorsal pancreatic duct is uncertain but possible. Spleen: Unremarkable Adrenals/Urinary Tract: There is a stable left adrenal gland lateral limb adenoma, but there is a new right adrenal 1.2 by 1.3 cm mass on image 64/2 suspicious for a metastatic lesion. Right adrenal gland unremarkable. Vascular calcifications in the renal hila. No hydronephrosis or hydroureter. Stomach/Bowel: Sigmoid colon diverticulosis. Oral contrast medium extends through to the descending colon. No appreciable dilated bowel. Vascular/Lymphatic: Aortoiliac atherosclerotic vascular disease. There is new and progressive retroperitoneal adenopathy. Left periaortic lymph node at the level of the renal artery on image 66/2 measures 1.5 cm in short axis. Left periaortic lymph  node below this level on image 71/2 measures 2.0 cm in short axis. Reproductive: Unremarkable Other: Paucity of intra-abdominal adipose tissue. Musculoskeletal: There is a greater degree of lucency associated with the metastatic lesion in the L1 vertebral body eccentric to the right on image 62/2. This lesion also appears to extend through the posterior cortex and into the anterior epidural space on image 62/2, which is new. The metastatic lesions at L3 have enlarged and ground more confluent, with new posterior bony destruction of the L3 vertebral body and epidural extension of tumor on image 76/2. IMPRESSION: 1. Progressive malignancy, with new prevascular and retroperitoneal adenopathy; progression of osseous metastatic disease with suspicion for potential epidural tumor involvement now at T10, L1, and L3; new left thoracic paraspinal tumor deposits; a suspected new metastatic lesion to the medial limb of the left adrenal gland, and mild enlargement  of a right lower lobe pulmonary nodule currently 1.4 by 0.8 cm. Increased moth-eaten appearance of the metastatic lesion in the left seventh rib. The remainder of the bony lesions appear stable. 2. 1.1 by 0.7 cm cystic lesion in the head of the pancreas. This could possibly be a small intraductal papillary mucinous neoplasm or a postinflammatory cystic lesion. I would suggest surveillance of this lesion in the context of the patient's lung cancer imaging. 3. Other imaging findings of potential clinical significance: Aortic Atherosclerosis (ICD10-I70.0) and Emphysema (ICD10-J43.9). Coronary atherosclerosis with mild cardiomegaly. Mildly prominent pulmonary artery likely from secondary pulmonary arterial hypertension. Esophageal dysmotility or reflux. Chronic bilateral fibrothorax with suspected rounded atelectasis in the lingula and left lower lobe. 4. Left adrenal adenoma. 5. Sigmoid colon diverticulosis. Electronically Signed   By: Van Clines M.D.   On: 04/03/2018 15:25   Ir US Guide Vasc Access Right  Result Date: 04/07/2018 INDICATION: 72 year old with metastatic small cell lung cancer. Port-A-Cath needed for treatment. EXAM: FLUOROSCOPIC AND ULTRASOUND GUIDED PLACEMENT OF A SUBCUTANEOUS PORT COMPARISON:  None. MEDICATIONS: Ancef 2 g; The antibiotic was administered within an appropriate time interval prior to skin puncture. ANESTHESIA/SEDATION: Versed 1.5 mg IV; Fentanyl 75 mcg IV; Moderate Sedation Time:  30 minutes The patient was continuously monitored during the procedure by the interventional radiology nurse under my direct supervision. FLUOROSCOPY TIME:  18 seconds, 1 mGy COMPLICATIONS: None immediate. PROCEDURE: The procedure, risks, benefits, and alternatives were explained to the patient. Questions regarding the procedure were encouraged and answered. The patient understands and consents to the procedure. Patient was placed supine on the interventional table. Ultrasound confirmed a patent  right internal jugular vein. The right chest and neck were cleaned with a skin antiseptic and a sterile drape was placed. Maximal barrier sterile technique was utilized including caps, mask, sterile gowns, sterile gloves, sterile drape, hand hygiene and skin antiseptic. The right neck was anesthetized with 1% lidocaine. Small incision was made in the right neck with a blade. Micropuncture set was placed in the right internal jugular vein with ultrasound guidance. The micropuncture wire was used for measurement purposes. The right chest was anesthetized with 1% lidocaine with epinephrine. #15 blade was used to make an incision and a subcutaneous port pocket was formed. 8 french [employer] was assembled. Subcutaneous tunnel was formed with a stiff tunneling device. The port catheter was brought through the subcutaneous tunnel. The port was placed in the subcutaneous pocket. The micropuncture set was exchanged for a peel-away sheath. The catheter was placed through the peel-away sheath and the tip was positioned at the superior cavoatrial junction. Catheter placement was  confirmed with fluoroscopy. The port was accessed and flushed with heparinized saline. The port pocket was closed using two layers of absorbable sutures and Dermabond. The vein skin site was closed using a single layer of absorbable suture and Dermabond. Sterile dressings were applied. Patient tolerated the procedure well without an immediate complication. Ultrasound confirmed a patent right internal jugular vein. Image was saved for documentation. Fluoroscopic images were taken and saved for this procedure. IMPRESSION: Placement of a subcutaneous port device. This is a CT injectable Port-A-Cath. Electronically Signed   By: Markus Daft M.D.   On: 04/07/2018 16:17   Dg Humerus Right  Result Date: 04/21/2018 CLINICAL DATA:  Severe right shoulder pain after fall yesterday. EXAM: RIGHT HUMERUS - 2+ VIEW COMPARISON:  None. FINDINGS: There is no  evidence of fracture or other focal bone lesions. Old right eighth and ninth posterior rib fractures. Severe osteopenia. Soft tissues are unremarkable. IMPRESSION: Negative. Electronically Signed   By: Titus Dubin M.D.   On: 04/21/2018 11:26   Ir Fluoro Guide Port Insertion Right  Result Date: 04/07/2018 INDICATION: 72 year old with metastatic small cell lung cancer. Port-A-Cath needed for treatment. EXAM: FLUOROSCOPIC AND ULTRASOUND GUIDED PLACEMENT OF A SUBCUTANEOUS PORT COMPARISON:  None. MEDICATIONS: Ancef 2 g; The antibiotic was administered within an appropriate time interval prior to skin puncture. ANESTHESIA/SEDATION: Versed 1.5 mg IV; Fentanyl 75 mcg IV; Moderate Sedation Time:  30 minutes The patient was continuously monitored during the procedure by the interventional radiology nurse under my direct supervision. FLUOROSCOPY TIME:  18 seconds, 1 mGy COMPLICATIONS: None immediate. PROCEDURE: The procedure, risks, benefits, and alternatives were explained to the patient. Questions regarding the procedure were encouraged and answered. The patient understands and consents to the procedure. Patient was placed supine on the interventional table. Ultrasound confirmed a patent right internal jugular vein. The right chest and neck were cleaned with a skin antiseptic and a sterile drape was placed. Maximal barrier sterile technique was utilized including caps, mask, sterile gowns, sterile gloves, sterile drape, hand hygiene and skin antiseptic. The right neck was anesthetized with 1% lidocaine. Small incision was made in the right neck with a blade. Micropuncture set was placed in the right internal jugular vein with ultrasound guidance. The micropuncture wire was used for measurement purposes. The right chest was anesthetized with 1% lidocaine with epinephrine. #15 blade was used to make an incision and a subcutaneous port pocket was formed. 8 french [employer] was assembled. Subcutaneous tunnel was  formed with a stiff tunneling device. The port catheter was brought through the subcutaneous tunnel. The port was placed in the subcutaneous pocket. The micropuncture set was exchanged for a peel-away sheath. The catheter was placed through the peel-away sheath and the tip was positioned at the superior cavoatrial junction. Catheter placement was confirmed with fluoroscopy. The port was accessed and flushed with heparinized saline. The port pocket was closed using two layers of absorbable sutures and Dermabond. The vein skin site was closed using a single layer of absorbable suture and Dermabond. Sterile dressings were applied. Patient tolerated the procedure well without an immediate complication. Ultrasound confirmed a patent right internal jugular vein. Image was saved for documentation. Fluoroscopic images were taken and saved for this procedure. IMPRESSION: Placement of a subcutaneous port device. This is a CT injectable Port-A-Cath. Electronically Signed   By: Markus Daft M.D.   On: 04/07/2018 16:17     ASSESSMENT/PLAN:  Small cell lung cancer, left ([place]) This is a very pleasant 71 year  old white male recently diagnosed with extensive stage small cell lung cancer. The patient completed 6 cycles of systemic chemotherapy with carboplatin and etoposide. He tolerated this treatment well except for increasing fatigue and pancytopenia. The patient was then on observation for 2 months. The restaging CT scan showed evidence for disease progression in multiple areas including mediastinal lymph nodes as well as bone and retroperitoneal metastasis. Patient is currently on treatment with weekly topotecan at a dose of 2.0 mg/M2.  Status post 2 cycles which he tolerated fairly well with the exception of fatigue, decreased appetite, and weight loss.  The patient was seen with Dr. Earlie Server.  We again discussed proceeding with treatment versus considering referral for palliative care/hospice.  The patient does not  want to stop treatment.  Recommend that he proceed with cycle 3 of his treatment as scheduled today. He will return next week for labs and chemotherapy and have a follow-up visit in 2 weeks for evaluation prior to cycle #5. Plan is for restaging CT scan after 6 to 8 weeks of treatment.  For weight loss, he was given a prescription for Medrol Dosepak.  For the anemia of chronic disease, we will continue to monitor his hemoglobin and hematocrit closely and consider the patient for transfusion if needed.  The patient was advised to call immediately if he has any concerning symptoms in the interval. The patient voices understanding of current disease status and treatment options and is in agreement with the current care plan. All questions were answered. The patient knows to call the clinic with any problems, questions or concerns. We can certainly see the patient much sooner if necessary.   No orders of the defined types were placed in this encounter.  Mikey Bussing, DNP, AGPCNP-BC, AOCNP 04/28/18  ADDENDUM: Hematology/Oncology Attending: I had a face-to-face encounter with the patient.  I recommended his care plan.  This is a very pleasant 72 years old white male with extensive stage small cell lung cancer status post induction systemic chemotherapy with carboplatin and etoposide for 6 cycles with partial response.  He was in observation for 2 months before developing disease progression.  The patient is current on treatment with weekly Topotecan at a reduced dose.  He is tolerating the treatment fairly well status post 2 weekly cycles.  He continues to complain of increasing fatigue and weakness.  I discussed with him consideration of palliative care and hospice referral but the patient would like to continue his treatment.  He will proceed with cycle #3 today.  We will see him back for follow-up visit in 2 weeks for evaluation before starting cycle #5. For the lack of appetite and fatigue, will  start the patient on Medrol Dosepak. He was advised to call immediately if he has any concerning symptoms in the interval.  Disclaimer: This note was dictated with voice recognition software. Similar sounding words can inadvertently be transcribed and may be missed upon review. Eilleen Kempf, MD 04/28/18

## 2018-05-01 ENCOUNTER — Telehealth: Payer: Self-pay | Admitting: *Deleted

## 2018-05-01 NOTE — Telephone Encounter (Signed)
Call from pt's wife reporting he is having increased pain from "bed sore" on his buttocks. He is taking Oxycodone 5/325 Q 6 hours with little relief. They are not applying anything to the area.  Recommended they reposition frequently and keep pressure off his sacrum. Also suggested she pick up a cushioned dressing to cover the area to protect it. She voiced understanding, stated it is difficult because he also has leg pain and shoulder pain. Informed her I will make MD aware of call for further instructions.

## 2018-05-04 ENCOUNTER — Telehealth: Payer: Self-pay | Admitting: Medical Oncology

## 2018-05-04 ENCOUNTER — Other Ambulatory Visit: Payer: Self-pay | Admitting: Medical Oncology

## 2018-05-04 DIAGNOSIS — C3492 Malignant neoplasm of unspecified part of left bronchus or lung: Secondary | ICD-10-CM

## 2018-05-04 MED ORDER — OXYCODONE-ACETAMINOPHEN 5-325 MG PO TABS: 1.0000 | ORAL_TABLET | Freq: Four times a day (QID) | ORAL | 0 refills | Status: AC | PRN

## 2018-05-04 NOTE — Telephone Encounter (Signed)
Skin sore - wife requests care instructions. I recommended donut pillow and referral to palliative care. Order placed.

## 2018-05-05 ENCOUNTER — Ambulatory Visit: Payer: Medicare Other | Admitting: Oncology

## 2018-05-05 ENCOUNTER — Telehealth: Payer: Self-pay | Admitting: Medical Oncology

## 2018-05-05 ENCOUNTER — Inpatient Hospital Stay: Payer: Medicare Other

## 2018-05-05 ENCOUNTER — Other Ambulatory Visit: Payer: Medicare Other

## 2018-05-05 VITALS — BP 162/91 | HR 95 | Temp 98.5°F | Resp 18

## 2018-05-05 DIAGNOSIS — C3492 Malignant neoplasm of unspecified part of left bronchus or lung: Secondary | ICD-10-CM

## 2018-05-05 DIAGNOSIS — Z5111 Encounter for antineoplastic chemotherapy: Secondary | ICD-10-CM | POA: Diagnosis not present

## 2018-05-05 LAB — CMP (CANCER CENTER ONLY)
ALBUMIN: 3.7 g/dL (ref 3.5–5.0)
ALK PHOS: 87 U/L (ref 40–150)
ALT: 17 U/L (ref 0–55)
AST: 56 U/L — AB (ref 5–34)
Anion gap: 8 (ref 3–11)
BUN: 18 mg/dL (ref 7–26)
CALCIUM: 10.3 mg/dL (ref 8.4–10.4)
CO2: 38 mmol/L — AB (ref 22–29)
CREATININE: 0.64 mg/dL — AB (ref 0.70–1.30)
Chloride: 94 mmol/L — ABNORMAL LOW (ref 98–109)
GFR, Est AFR Am: >60 mL/min (ref >60)
GFR, Estimated: >60 mL/min (ref >60)
Glucose, Bld: 102 mg/dL (ref 70–140)
Potassium: 3.5 mmol/L (ref 3.5–5.1)
SODIUM: 140 mmol/L (ref 136–145)
Total Bilirubin: 0.3 mg/dL (ref 0.2–1.2)
Total Protein: 6.4 g/dL (ref 6.4–8.3)

## 2018-05-05 LAB — CBC WITH DIFFERENTIAL (CANCER CENTER ONLY)
BASOS PCT: 0 %
Basophils Absolute: 0 10*3/uL (ref 0.0–0.1)
EOS ABS: 0 10*3/uL (ref 0.0–0.5)
Eosinophils Relative: 1 %
HEMATOCRIT: 31.4 % — AB (ref 38.4–49.9)
Hemoglobin: 10 g/dL — ABNORMAL LOW (ref 13.0–17.1)
Lymphocytes Relative: 54 %
Lymphs Abs: 2.3 10*3/uL (ref 0.9–3.3)
MCH: 29.9 pg (ref 27.2–33.4)
MCHC: 31.8 g/dL — ABNORMAL LOW (ref 32.0–36.0)
MCV: 94 fL (ref 79.3–98.0)
MONOS PCT: 7 %
Monocytes Absolute: 0.3 10*3/uL (ref 0.1–0.9)
NEUTROS ABS: 1.6 10*3/uL (ref 1.5–6.5)
Neutrophils Relative %: 38 %
Platelet Count: 171 10*3/uL (ref 140–400)
RBC: 3.34 MIL/uL — ABNORMAL LOW (ref 4.20–5.82)
RDW: 14.2 % (ref 11.0–14.6)
WBC Count: 4.2 10*3/uL (ref 4.0–10.3)

## 2018-05-05 MED ORDER — TOPOTECAN HCL CHEMO INJECTION 4 MG
1.9500 mg/m2 | Freq: Once | INTRAVENOUS | Status: AC
  Administered 2018-05-05: 3 mg via INTRAVENOUS
  Filled 2018-05-05: qty 3

## 2018-05-05 MED ORDER — SODIUM CHLORIDE 0.9 % IV SOLN
Freq: Once | INTRAVENOUS | Status: AC
  Administered 2018-05-05: 10:00:00 via INTRAVENOUS

## 2018-05-05 MED ORDER — PROCHLORPERAZINE MALEATE 10 MG PO TABS
ORAL_TABLET | ORAL | Status: AC
  Filled 2018-05-05: qty 1

## 2018-05-05 MED ORDER — SODIUM CHLORIDE 0.9% FLUSH
10.0000 mL | INTRAVENOUS | Status: DC | PRN
  Administered 2018-05-05: 10 mL
  Filled 2018-05-05: qty 10

## 2018-05-05 MED ORDER — HEPARIN SOD (PORK) LOCK FLUSH 100 UNIT/ML IV SOLN
500.0000 [IU] | Freq: Once | INTRAVENOUS | Status: AC | PRN
  Administered 2018-05-05: 500 [IU]
  Filled 2018-05-05: qty 5

## 2018-05-05 MED ORDER — PROCHLORPERAZINE MALEATE 10 MG PO TABS
10.0000 mg | ORAL_TABLET | Freq: Once | ORAL | Status: AC
  Administered 2018-05-05: 10 mg via ORAL

## 2018-05-05 NOTE — Telephone Encounter (Signed)
Palliative care referral f/u- was not received yesterday so Jose Mckinney took referral today.

## 2018-05-05 NOTE — Patient Instructions (Signed)
[facility] Discharge Instructions for Patients Receiving Chemotherapy  Today you received the following chemotherapy agents:  Hycamtin (topotecan)  To help prevent nausea and vomiting after your treatment, we encourage you to take your nausea medication as prescribed.   If you develop nausea and vomiting that is not controlled by your nausea medication, call the clinic.   BELOW ARE SYMPTOMS THAT SHOULD BE REPORTED IMMEDIATELY:  *FEVER GREATER THAN 100.5 F  *CHILLS WITH OR WITHOUT FEVER  NAUSEA AND VOMITING THAT IS NOT CONTROLLED WITH YOUR NAUSEA MEDICATION  *UNUSUAL SHORTNESS OF BREATH  *UNUSUAL BRUISING OR BLEEDING  TENDERNESS IN MOUTH AND THROAT WITH OR WITHOUT PRESENCE OF ULCERS  *URINARY PROBLEMS  *BOWEL PROBLEMS  UNUSUAL RASH Items with * indicate a potential emergency and should be followed up as soon as possible.  Feel free to call the clinic should you have any questions or concerns. The clinic phone number is [phone].  Please show the [place] [facility] at check-in to the Emergency Department and triage nurse.

## 2018-05-06 ENCOUNTER — Telehealth: Payer: Self-pay

## 2018-05-06 ENCOUNTER — Telehealth: Payer: Self-pay | Admitting: *Deleted

## 2018-05-06 NOTE — Telephone Encounter (Signed)
I spoke to wife -she said he is wearing his oxygen now. I called Palliative care and Lelan Pons will f/u with Inez Catalina to call wife. I instructed wife to take pt to ED for evaluation if symptoms worsen.

## 2018-05-06 NOTE — Telephone Encounter (Signed)
Received call from pt's wife, Lynnda Child that pt started yesterday & today is having hallucinations & seeing people tearing down the house & rebuilding it & he thinks he is living at old address.  He won't use his O2 & sats @ 84.  He is argumentative.  She gave him his medrol this am but hasn't been taken as prescribed & probably has 6-7 left.  He wouldn't take it. She is upset & wonders if he has brain mets.  She wants to know what to do.  Message routed to Dr Mohamed/Pod RN

## 2018-05-06 NOTE — Telephone Encounter (Signed)
Phone call placed to patient's wife to schedule visit with Palliative Care. Visit scheduled for 05/07/18 @ 9:00 am.

## 2018-05-07 ENCOUNTER — Other Ambulatory Visit: Payer: Medicare Other | Admitting: Internal Medicine

## 2018-05-07 ENCOUNTER — Encounter: Payer: Self-pay | Admitting: Internal Medicine

## 2018-05-07 DIAGNOSIS — R52 Pain, unspecified: Secondary | ICD-10-CM

## 2018-05-07 DIAGNOSIS — R4182 Altered mental status, unspecified: Secondary | ICD-10-CM

## 2018-05-07 DIAGNOSIS — R531 Weakness: Secondary | ICD-10-CM

## 2018-05-07 DIAGNOSIS — E46 Unspecified protein-calorie malnutrition: Secondary | ICD-10-CM

## 2018-05-07 DIAGNOSIS — R0600 Dyspnea, unspecified: Secondary | ICD-10-CM

## 2018-05-07 DIAGNOSIS — Z7189 Other specified counseling: Secondary | ICD-10-CM

## 2018-05-07 DIAGNOSIS — K5900 Constipation, unspecified: Secondary | ICD-10-CM

## 2018-05-07 NOTE — Progress Notes (Signed)
05/09/2018  PALLIATIVE CARE CONSULT Malena Peer.  DOB: 02-26-1946. [address] [place] [address] [phone], [phone] REFERRING PROVIDER: Dr. Lorna Few; Maryanna Shape NP (last OV 04/28/2018). PCP: [name], PA-C ([phone], [phone] Family: spouse Ivin Booty [phone], son Corene Cornea  BILLABLE  ICD-10: [place] [phone])  IMPRESSION/RECOMMENDATIONS: 1. Mental Status changes of uncertain etiology; possibly related to disease progression/side effects of recently prescribed steroid dose pack/chemo. -Onset audio and visual hallucinations 2 evenings ago. Rambling speech and occasional agitation. Not sleeping as well, and family note jerking movements when asleep. Previously, was sleeping 19/24 hours/day.  This morning patient seems be slowly improving, though not at his baseline. Family deferred taking him to the ER because they worried that a hospital visit would prove too exhausting, and might make the whole situation worse.  a. Do not resume prednisone dose pack (prescribed as an appetite stimulant).   b. Will follow up tomorrow am.  2. Dyspnea; progressive over the last few months: -Marked dyspnea after ambulating just a few steps. Has been unable to lay back in recliner greater than 45 degrees for quite some time b/c of COPD.  O2 2L.   3. Fatigue/weakness; progressive - Using transport wheelchair for last month, with use of walker to steady himself when standing.    4. Constipation:  -LBM within the last 3-4 days. Using docusate 100mg  prn  a. Increase docusate to bid; prn MgCitrate available and family will dose to effect.  5. Pain: Left lower anterior chest "irritating/numbness", generalized aches/pains, right shoulder pain and left upper leg pain thought r/t metastasis. Managed with prn hydocodone/acetaminophen 5-325mg  about 3-4 tabs/day.   6. Protein Calorie Malnutrition: -progressive weight loss; currently 99  lbs at 5'7" for a BMI 15.5 kg/m2. Weight loss of 17 lbs over last 7 months (14.6% of body weight).  Loss of 66 lbs over previous 4 years. Poor appetite; 1 month ago eating bites/very small meals. Now just sips of Boost. Nothing yesterday. No nausea since last chemo treatment about 1 week ago.   7. Advanced Care Directives: -DNR form reviewed, signed, and copy left in home. Recommended placement on fridge.  -MOST form discussed/educational information provided. Family wish to discuss further before completion of form. -Risks vs burdens of ongoing chemo therapy vs. referral to hospice. Hospice services and criteria for admission discussed. Patient's spouse and son Corene Cornea wish to think of these options over the weekend.  I will follow up tomorrow (Fri) to see how he is doing, and again on Monday. Patient scheduled for chemo infusion next week.   I spent 32minutes providing this consultation (from 9am to 10:15qa). More than 50% of that time was spent coordinating communication.  HPI: 72 yo male, with metastatic small cell lung cancer, currently undergoing chemotherapy for progression.   Referred to Palliative Care for assistance with symptom management, help with coordination of resources, and ongoing discussions regarding goals of care/advanced directives  CODE STATUS:  PPS: weak 40%.  HOSPICE ELIGIBILITY: yes, pending discontinuation of aggressive chemotherapy  MEDICATIONS :  Albuterol neb prn (uses 1-2 x/d), Atrovent neb prn, claritin 10mg  qd, hydrocodone-acetaminophen 5-325mg  3-4 tabs/day, Compazine 10mg  prn, Zoloft 50mg  bid, sildenafil 20mg , methylprednisolone dose pack (ordered last week but only took for initial few days, and yesterday).      ALLERGIES: NKDA  PAST MEDICAL HISTORY: Left lung small cell cancer diagnosed Sept 2019, currently with widespread metastasis of mediastinal lymph nodes/bone, and with retroperitoneal mass.  Ongoing second line chemotherapy. Port-a-cath right upper chest  placed about 5 weeks ago. Anxiety, CHF, COPD, Depression, Dyspnea, HTN, Pulmonary HTN, left pleural effusion, seasonal allergies,   PHYSICAL EXAM: [place], frail gentleman. Very sweet but experiencing ongoing episodic visual hallucinations. VS: BP 100/70, HR 100-120, RR 28, Sats 98% 2L. Temp 97 degrees HEENT: [place], AT LUNGS: Right base egophony; very diminished breath sounds; little air movement left; better appreciated right mid/upper lobe. No wheezing. CARDIAC: RRR without MRG. Distal pulses intact ABD: soft, non distended. NABS EXTREMITIES:  MUSCULOSKELETAL: overall muscle/adipose wasting. SKIN: warm, dry and intact. NEURO: Answers some questions appropriately, but occasional off topic rambling.   Violeta Gelinas NP-C

## 2018-05-08 ENCOUNTER — Other Ambulatory Visit: Payer: Medicare Other | Admitting: Internal Medicine

## 2018-05-08 ENCOUNTER — Telehealth: Payer: Self-pay | Admitting: *Deleted

## 2018-05-08 DIAGNOSIS — C3492 Malignant neoplasm of unspecified part of left bronchus or lung: Secondary | ICD-10-CM

## 2018-05-08 NOTE — Telephone Encounter (Signed)
Pt wife called with the following concerns: 1. Pt having hallucinations 2. Pt having Tremors with sleep 3. Pt not cognitive. 4. Pain is worse. Wife requesting if MD can increase pt's pain medication. Pt was seen by Palliative care RN yesterday who also discussed pain control and possible need for additional pain meds. Informed wife I will review with MD. Reviewed with MD, returned call to pt wife, unable to reach. LMOVM instructed wife to call back to discuss options. MD advised increasing pain medication could increase above side effects, pt will need to consider Hospice. NO change to pain medication at this time.

## 2018-05-08 NOTE — Telephone Encounter (Signed)
Wife called back, states "We want Hospice." Confirmed with Ivin Booty decision for hospice, informed her to expect a call today from Hospice to discuss plan going further. No further concerns. Call and referral to hospice made, S/w Jen who advised she will call Ivin Booty immediately as she has an evening appt available. No further concerns. Message to scheduling to cancel all pt appts

## 2018-05-12 ENCOUNTER — Other Ambulatory Visit: Payer: Medicare Other

## 2018-05-12 ENCOUNTER — Ambulatory Visit: Payer: Medicare Other

## 2018-05-12 ENCOUNTER — Ambulatory Visit: Payer: Medicare Other | Admitting: Oncology

## 2018-05-19 ENCOUNTER — Ambulatory Visit: Payer: Medicare Other

## 2018-05-19 ENCOUNTER — Other Ambulatory Visit: Payer: Medicare Other

## 2018-05-19 ENCOUNTER — Ambulatory Visit: Payer: Medicare Other | Admitting: Internal Medicine

## 2018-05-23 DEATH — deceased

## 2018-05-26 ENCOUNTER — Other Ambulatory Visit: Payer: Medicare Other

## 2018-05-26 ENCOUNTER — Ambulatory Visit: Payer: Medicare Other | Admitting: Oncology

## 2018-05-26 ENCOUNTER — Ambulatory Visit: Payer: Medicare Other

## 2018-06-02 ENCOUNTER — Ambulatory Visit: Payer: Medicare Other

## 2018-06-02 ENCOUNTER — Other Ambulatory Visit: Payer: Medicare Other

## 2018-06-02 ENCOUNTER — Ambulatory Visit: Payer: Medicare Other | Admitting: Internal Medicine

## 2018-10-30 IMAGING — CT CT CHEST W/ CM
2 of 5 series · 12 of 36 positions shown, 15 images · IV contrast (ISOVUE 300)
Comparison: Chest radiographs 12/08/2017, CTs of the chest, abdomen
and pelvis 10/31/2017 and PET-CT 08/27/2017.

CLINICAL DATA: Small cell lung cancer diagnosed 09/15/2017.
Chemotherapy in progress. Patient reports cough and chronic
shortness of breath with low abdominal pain for 1 year.

EXAM:
CT CHEST, ABDOMEN, AND PELVIS WITH CONTRAST
TECHNIQUE: Multidetector CT imaging of the chest, abdomen and pelvis was
performed following the standard protocol during bolus
administration of intravenous contrast.
CONTRAST:  100 ml Jsovue-PRR.

[Series 2: cap with · axial · 0.67mm/px · z∈[-572,-67]mm · 9 of 127 slices shown, 12 images]
[im 13/127  mediastinal]
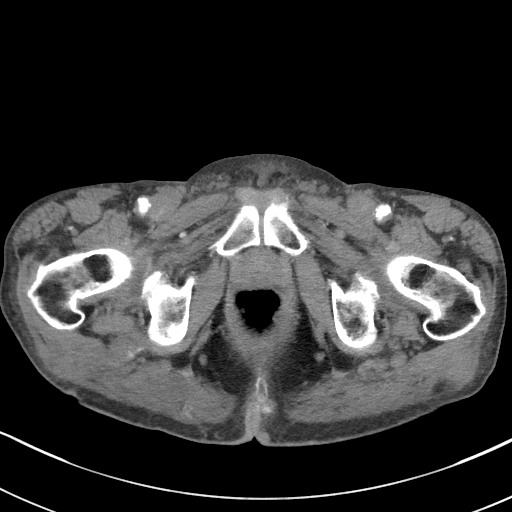
[im 13/127  lung]
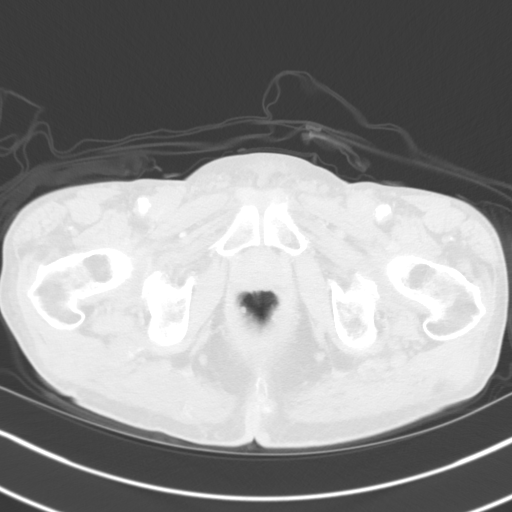
[im 26/127  lung]
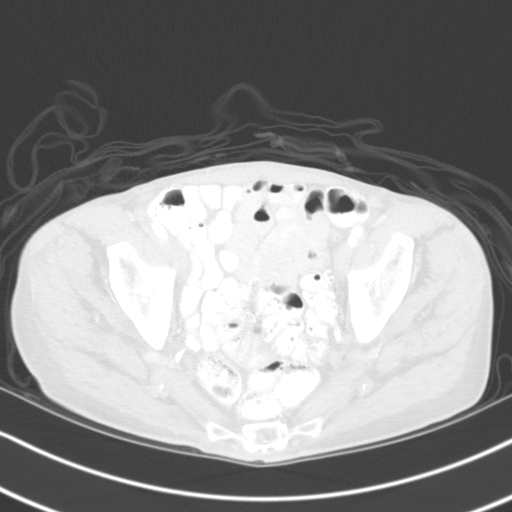
[im 38/127  lung]
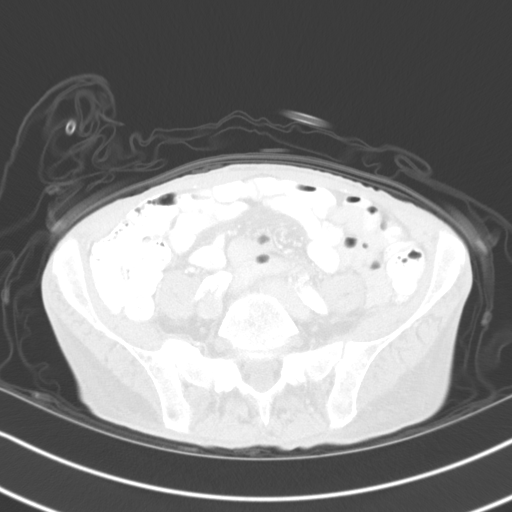
[im 51/127  lung]
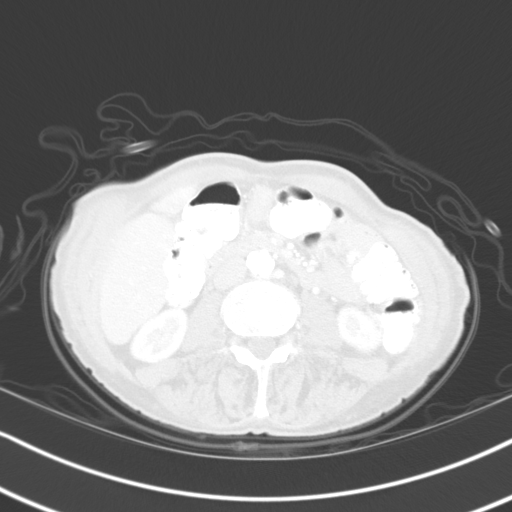
[im 64/127  mediastinal]
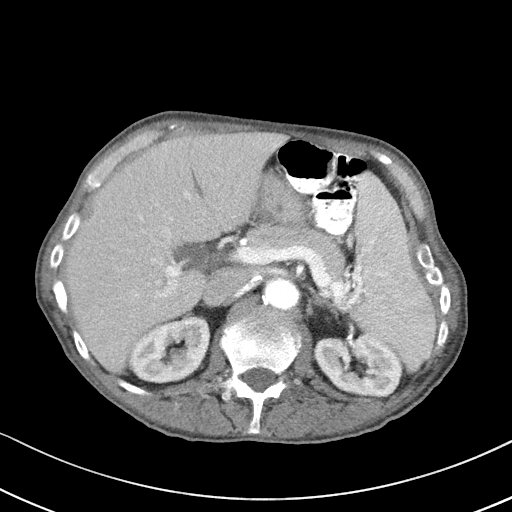
[im 64/127  lung]
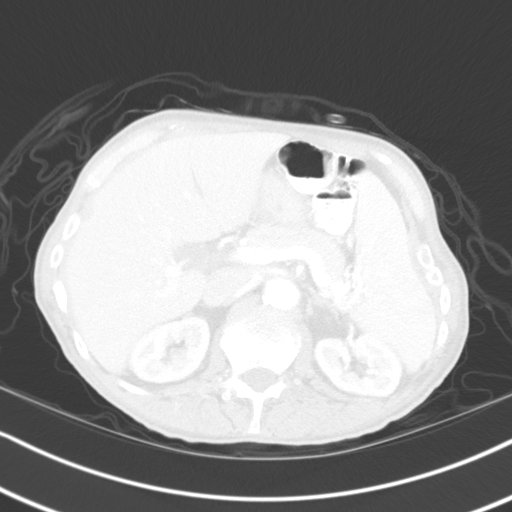
[im 76/127  lung]
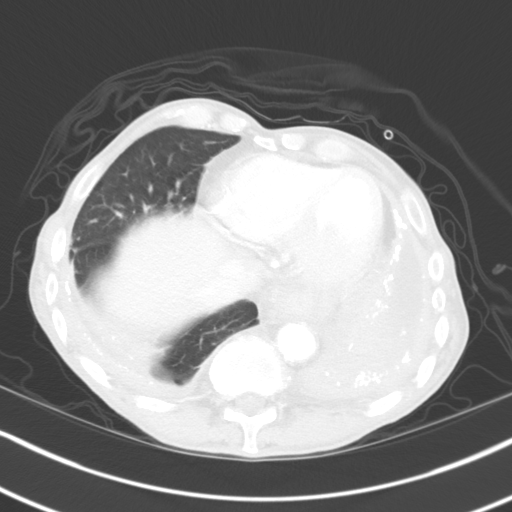
[im 89/127  lung]
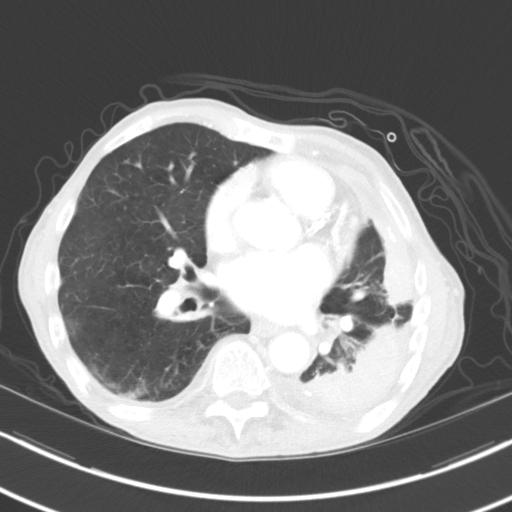
[im 101/127  lung]
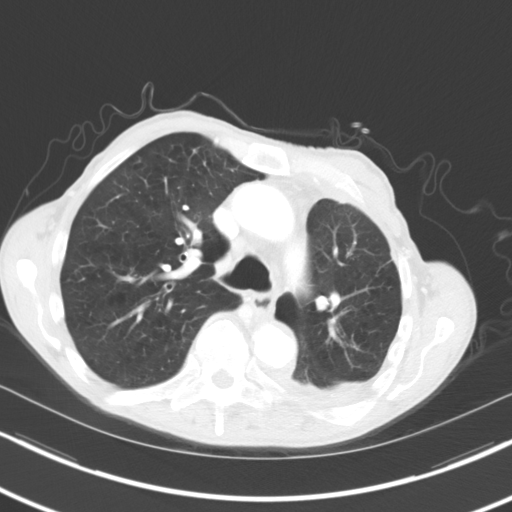
[im 114/127  mediastinal]
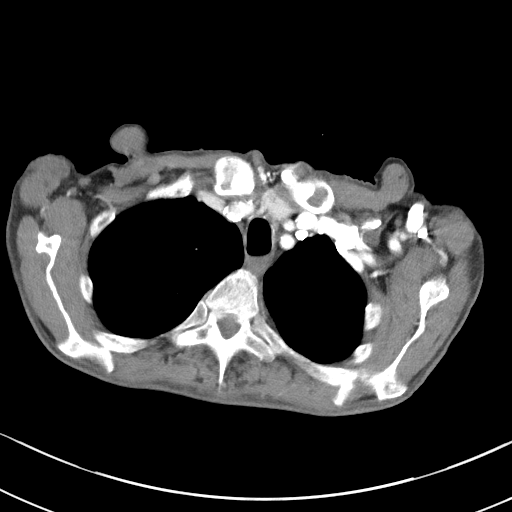
[im 114/127  lung]
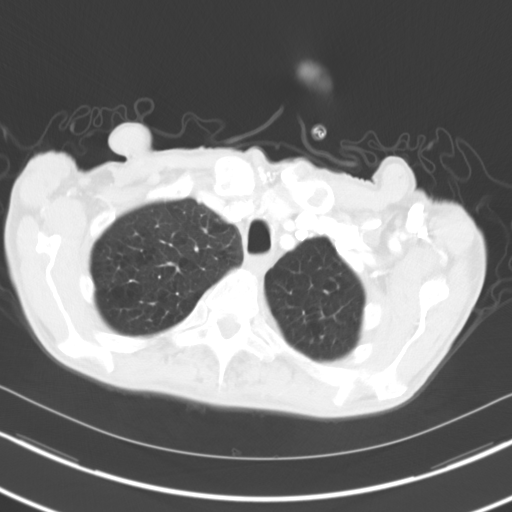

[Series 4: coronals · coronal · 0.80mm/px · 3 of 147 slices shown]
[im 30/147  lung]
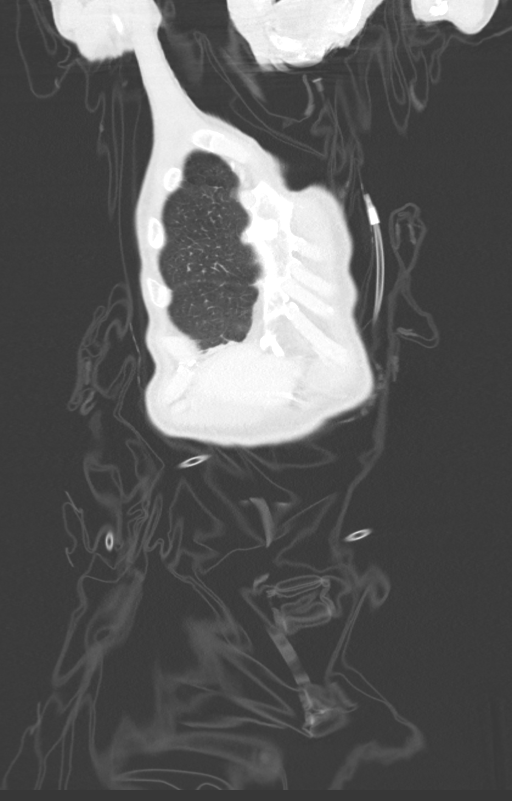
[im 59/147  lung]
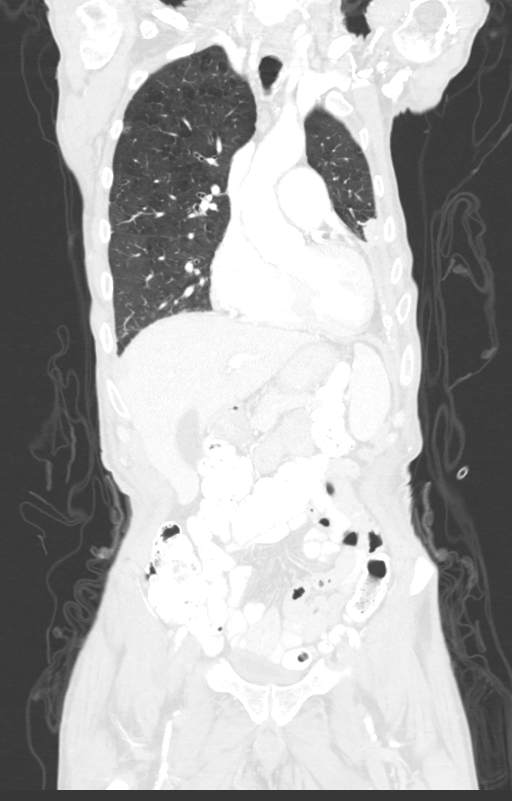
[im 88/147  lung]
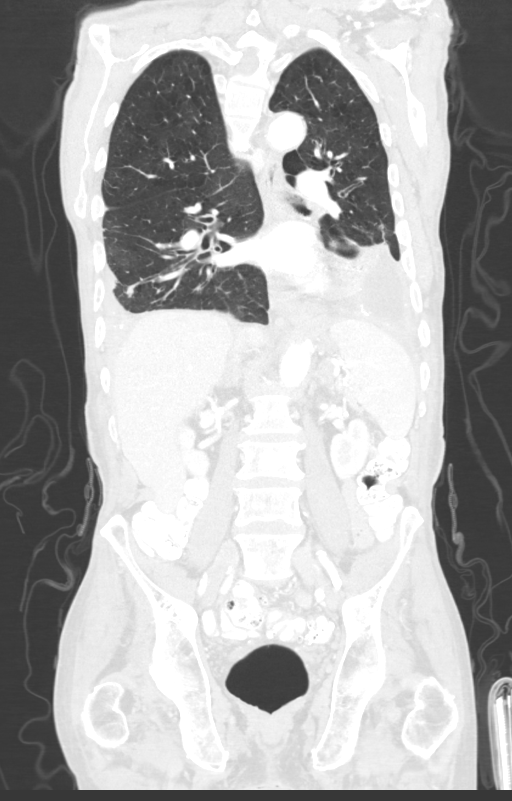

[12 of 36 positions shown; findings below may reference images not displayed]

FINDINGS: CT CHEST FINDINGS

Cardiovascular: Atherosclerosis of the aorta, great vessels and
coronary arteries. There is central enlargement of the pulmonary
arteries consistent with pulmonary arterial hypertension. No acute
vascular findings are seen. There is stable mild cardiomegaly. No
significant pericardial fluid.

Mediastinum/Nodes: There are no enlarged mediastinal, hilar or
axillary lymph nodes. No definite residual enlarged left
supraclavicular node. Small mediastinal lymph nodes are present. The
trachea, thyroid gland and esophagus demonstrate no significant
findings.

Lungs/Pleura: Chronic bilateral calcified fibrothoraces again noted,
left greater than right. There is increased consolidation medially
in the left lower lobe. Other areas of chronic atelectasis are
present in the lingula and left lobe. The subpleural enhancing
nodularity previously noted posteriorly in the LEFT lower lobe is
less evident (axial image 38 of series 2). There is a stable 8 mm
right lower lobe nodule on image 118. There is a stable sub solid
lesion peripherally in the right upper lobe (images 52 through 54.
Underlying emphysema and central airway thickening noted.

Musculoskeletal/Chest wall: Multiple sclerotic osseous metastases
are again noted within the spine, ribs, sternum, bilateral scapula
and left humeral head. No lytic lesion or pathologic fracture seen.

CT ABDOMEN AND PELVIS FINDINGS

Hepatobiliary: The liver appears stable without suspicious findings.
There is a small cyst adjacent to the gallbladder. No evidence of
gallstones, gallbladder wall thickening or biliary dilatation.

Pancreas: Stable small fluid attenuation lesion in the head of the
pancreas, measuring 8 mm on image 72. No evidence pancreatic ductal
dilatation, enhancing mass or surrounding inflammation.

Spleen: Normal in size without focal abnormality.

Adrenals/Urinary Tract: Similar appearance of left adrenal nodule
measuring 2.3 x 1.4 cm on image 60. This has a nonspecific density
of 58 HU. This measured 3 HU on prior PET-CT (image 108). There was
subjacent hypermetabolic activity which appeared to be due to lymph
nodes on CT. This is probably an incidental adenoma. The right
adrenal gland appears normal. Renovascular calcifications
bilaterally as well as possible left renal calculi. No evidence of
hydronephrosis, ureteral calculus or enhancing renal mass. The
bladder appears unremarkable.

Stomach/Bowel: No evidence of bowel wall thickening, distention or
surrounding inflammatory change. Mild sigmoid diverticulosis.

Vascular/Lymphatic: No residual enlarged abdominal or pelvic lymph
nodes. There is diffuse aortic and branch vessel atherosclerosis. No
evidence of large vessel occlusion or significant venous
abnormality.

Reproductive: The prostate gland and seminal vesicles appear
unremarkable.

Other: Limited subcutaneous and intra- abdominal fat.  No ascites.

Musculoskeletal: Multifocal blastic metastases within the spine and
pelvis are grossly stable. No lytic lesion or pathologic fracture
identified.
IMPRESSION: 1. No residual adenopathy demonstrated.
2. Sclerotic osseous metastases are grossly stable. No lytic lesion
or pathologic fracture.
3. Increased consolidation in the left lower lobe medially. No
discrete residual mass lesion identified.
4. Stable chronic bilateral fibrothoraces and right lung nodularity.
5. Stable left adrenal nodule, likely an adenoma based on prior
PET-CT.

## 2018-12-19 IMAGING — CT CT ABD-PELV W/ CM
2 of 5 series · 12 of 36 positions shown, 15 images · IV contrast (ISOVUE 300)
Comparison: 12/11/2017

CLINICAL DATA: Small cell lung cancer. Chemotherapy ongoing.
Subsequent treatment evaluation.

EXAM:
CT CHEST, ABDOMEN, AND PELVIS WITH CONTRAST
TECHNIQUE: Multidetector CT imaging of the chest, abdomen and pelvis was
performed following the standard protocol during bolus
administration of intravenous contrast.
CONTRAST:  100mL [id number] IOPAMIDOL (ZX1NPW-YTT) INJECTION 61%

[Series 2: cap with · axial · 0.77mm/px · z∈[-582,-77]mm · 9 of 127 slices shown, 12 images]
[im 13/127  mediastinal]
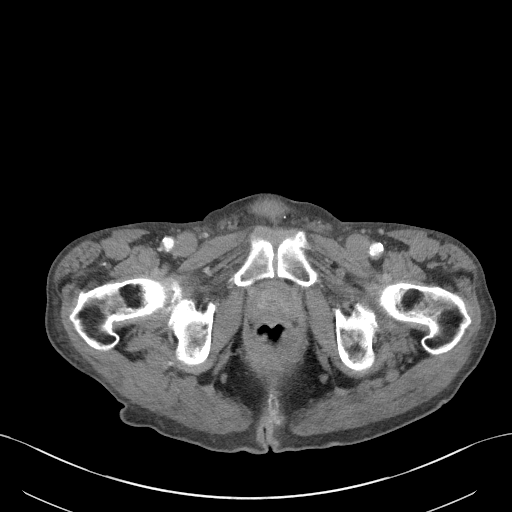
[im 13/127  lung]
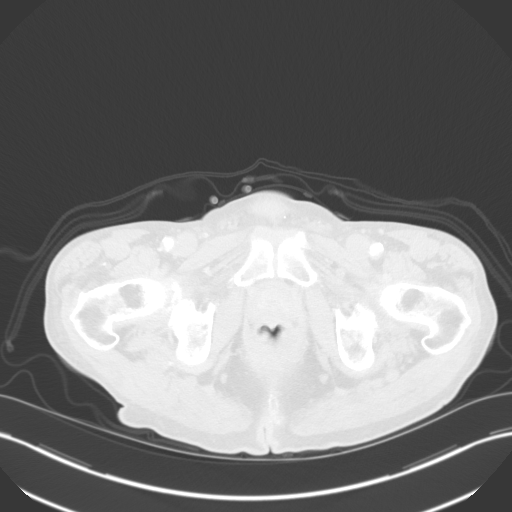
[im 26/127  lung]
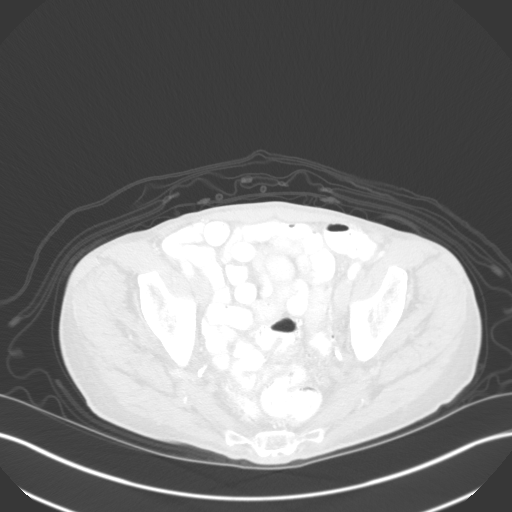
[im 38/127  lung]
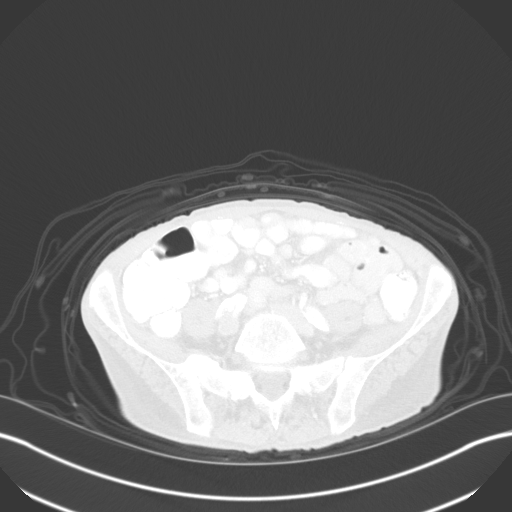
[im 51/127  lung]
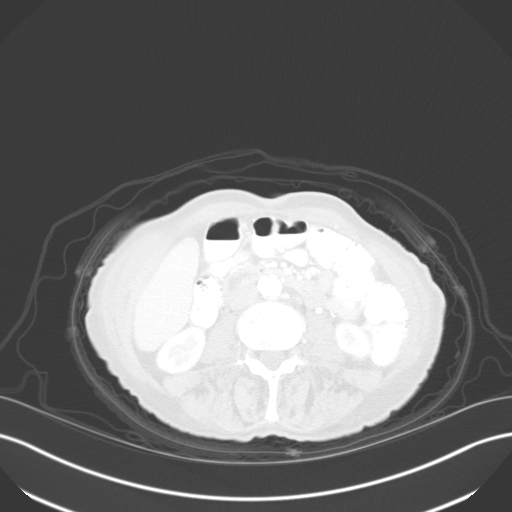
[im 64/127  mediastinal]
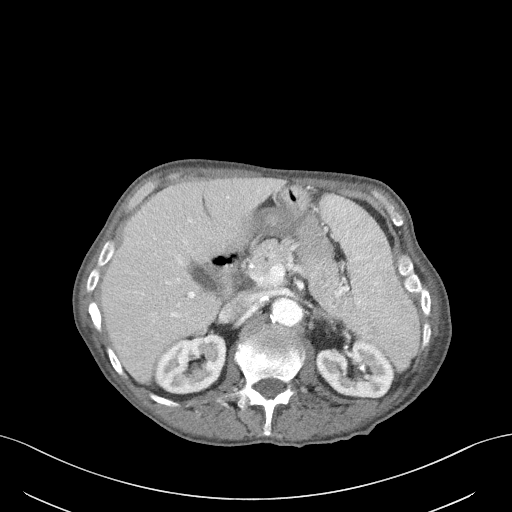
[im 64/127  lung]
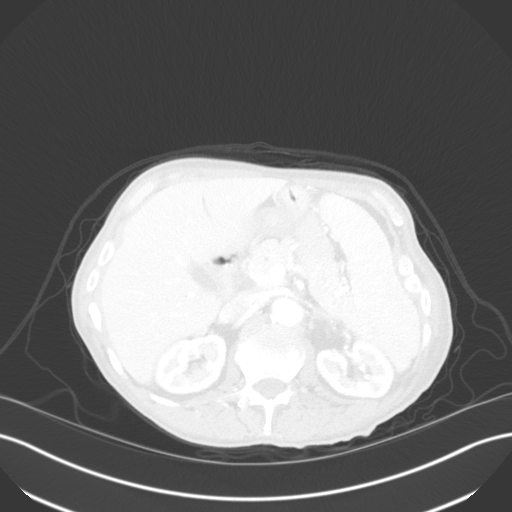
[im 76/127  lung]
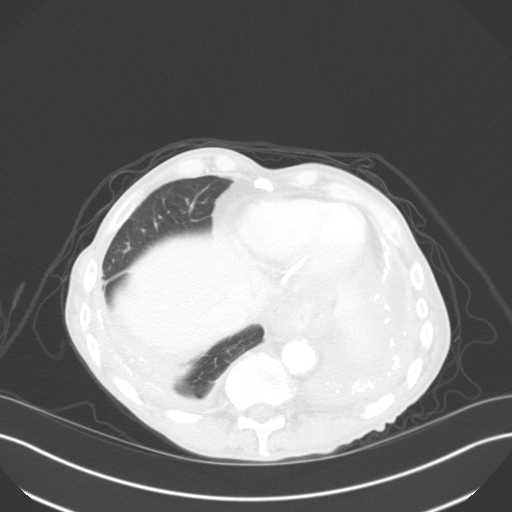
[im 89/127  lung]
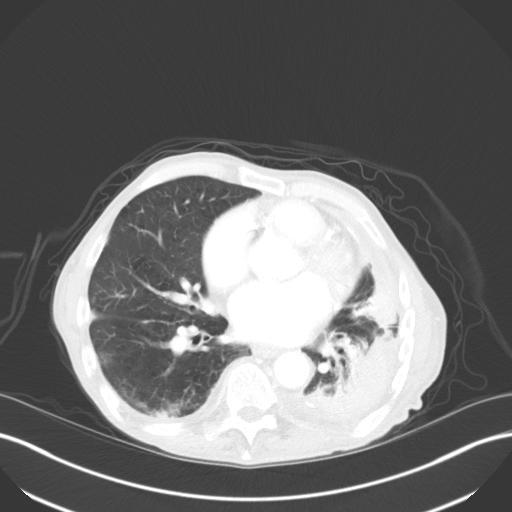
[im 101/127  lung]
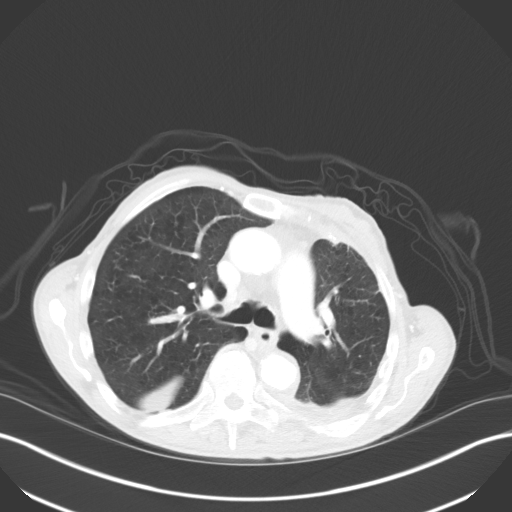
[im 114/127  mediastinal]
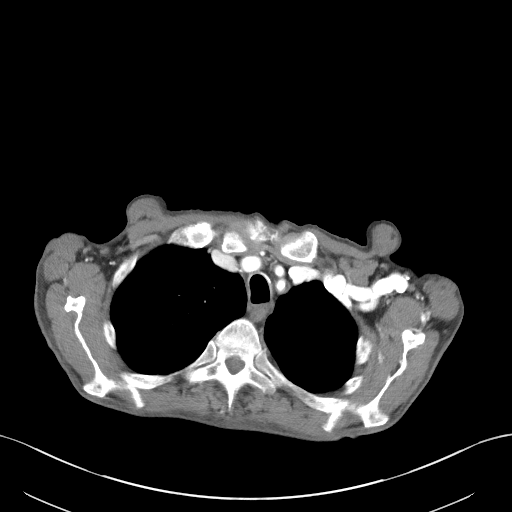
[im 114/127  lung]
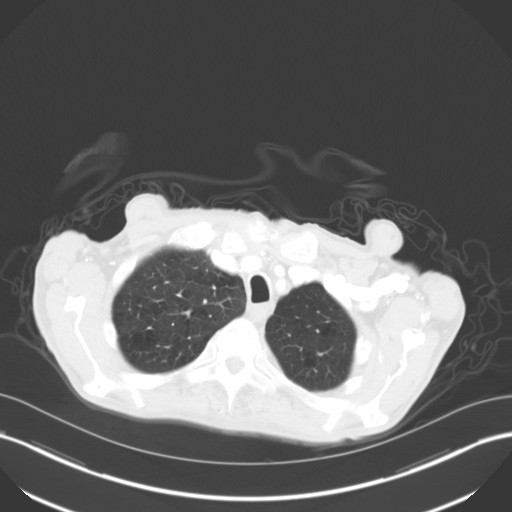

[Series 6: coronals · coronal · 0.72mm/px · 3 of 152 slices shown]
[im 31/152  lung]
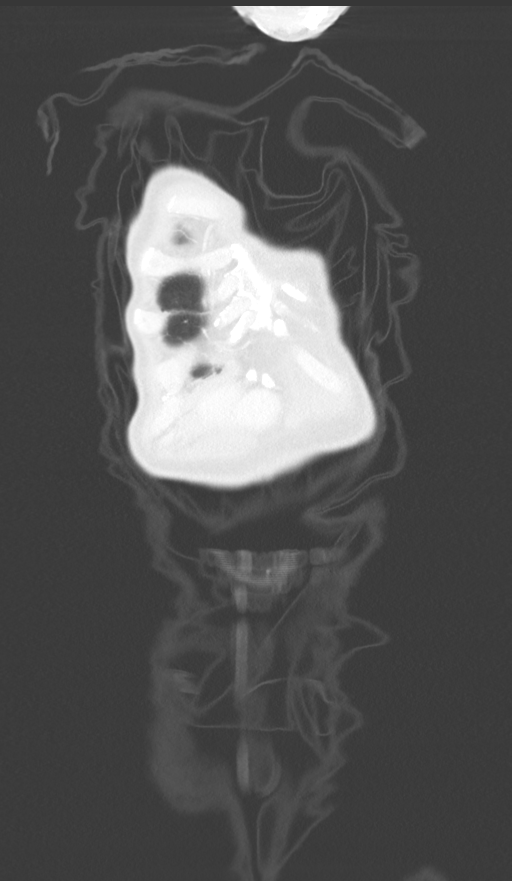
[im 61/152  lung]
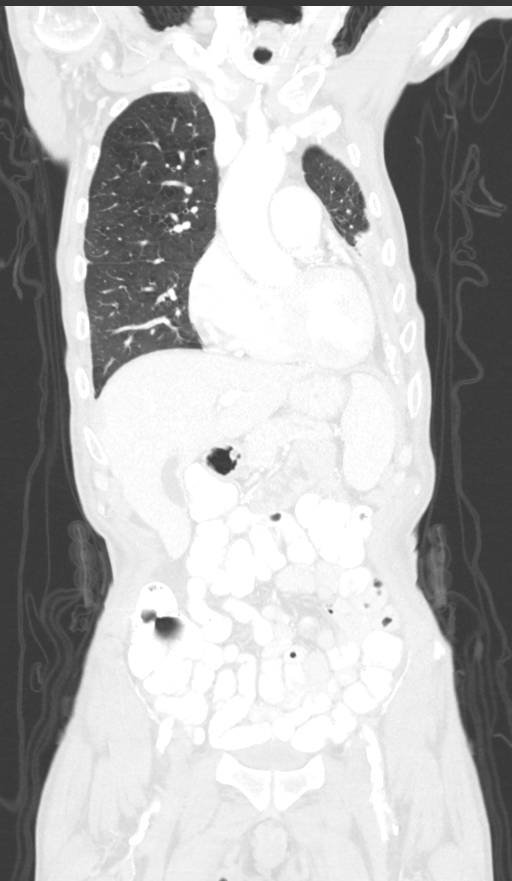
[im 91/152  lung]
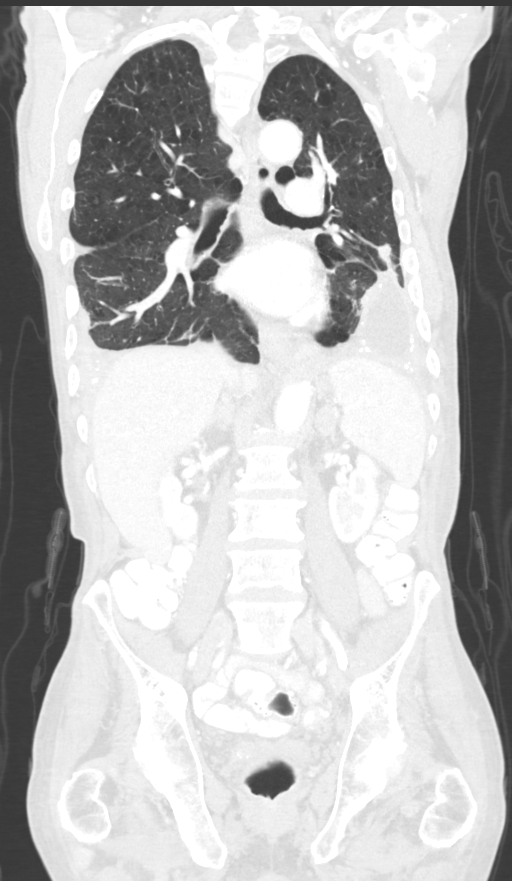

[12 of 36 positions shown; findings below may reference images not displayed]

FINDINGS: CT CHEST FINDINGS

Cardiovascular: Coronary artery calcification and aortic
atherosclerotic calcification.

Mediastinum/Nodes: No axillary or supraclavicular adenopathy. No
mediastinal hilar adenopathy. No fluid. Esophagus normal.

Lungs/Pleura: Peripheral nodule in the RIGHT upper lobe (image 1,
series 4) is unchanged. Pleural parenchymal thickening in the
posterior RIGHT upper lobe (image 47, series 4) is unchanged.

Band of pleural thickening along the RIGHT oblique fissure is
increased but appears benign (image 72),. small RIGHT effusion.
Again demonstrated calcified pleural surface.

There is volume loss in the LEFT hemithorax. There is a partially
loculated fluid collection at the LEFT lung base. Associated lower
lobe atelectasis. Pleural pleural calcification consistent
fibrothorax. One lateral portion of rounded consolidation in the
LEFT lower lobe measures 2.5 cm (image 81, series 6 compared to
cm for no interval change

Musculoskeletal: Again demonstrated lytic and sclerotic lesions
within the thoracic and lumbar spine not changed. No new lesions the
C7 cervical lesion also demonstrated

CT ABDOMEN AND PELVIS FINDINGS

Hepatic: No biliary duct dilatation.  Small hepatic cyst.

Pancreas: Small cysts in pancreatic head 9 mm stable. No pancreatic
duct dilatation

Spleen: Normal spleen

Adrenals/urinary tract: Stable LEFT adrenal adenoma. Normal RIGHT
adrenal gland. Normal kidneys, ureters and bladder

Stomach/Bowel: Stomach, small bowel, appendix, and cecum are normal.
The colon and rectosigmoid colon are normal.

Vascular/Lymphatic: Abdominal aorta is normal caliber with
atherosclerotic calcification. There is no retroperitoneal or
periportal lymphadenopathy. No pelvic lymphadenopathy.

Reproductive: Prostate normal

Other: No peritoneal or omental metastasis.

Musculoskeletal: Stable rounded sclerotic lesions in the pelvis and
spine.
IMPRESSION: Chest Impression:

1. No evidence of metastatic progression in thorax.
2. A complex fibrothorax in the LEFT lower lobe with associated
round atelectasis. No interval change.
3. New new pulmonary nodularity.
4. No mediastinal adenopathy.

Abdomen / Pelvis Impression:

1. No evidence of soft tissue metastasis in the abdomen pelvis.
2. Stable sclerotic metastatic lesions in the pelvis and spine.
